# Patient Record
Sex: Female | Born: 1970 | Race: Black or African American | Hispanic: No | Marital: Married | Smoking: Current some day smoker
Health system: Southern US, Community
[De-identification: ages and names within clinical notes are randomized; demographics above are authoritative.]

## PROBLEM LIST (undated history)

## (undated) ENCOUNTER — Emergency Department (HOSPITAL_COMMUNITY): Payer: BC Managed Care – PPO

## (undated) DIAGNOSIS — D649 Anemia, unspecified: Secondary | ICD-10-CM

## (undated) DIAGNOSIS — Z5189 Encounter for other specified aftercare: Secondary | ICD-10-CM

## (undated) DIAGNOSIS — Z9221 Personal history of antineoplastic chemotherapy: Secondary | ICD-10-CM

## (undated) DIAGNOSIS — Z923 Personal history of irradiation: Secondary | ICD-10-CM

## (undated) DIAGNOSIS — D61818 Other pancytopenia: Secondary | ICD-10-CM

## (undated) HISTORY — DX: Anemia, unspecified: D64.9

## (undated) HISTORY — PX: NO PAST SURGERIES: SHX2092

## (undated) HISTORY — DX: Encounter for other specified aftercare: Z51.89

---

## 2001-09-20 ENCOUNTER — Emergency Department (HOSPITAL_COMMUNITY): Admission: EM | Admit: 2001-09-20 | Discharge: 2001-09-20 | Payer: Self-pay | Admitting: Emergency Medicine

## 2001-09-20 ENCOUNTER — Encounter: Payer: Self-pay | Admitting: Emergency Medicine

## 2004-05-28 ENCOUNTER — Emergency Department (HOSPITAL_COMMUNITY): Admission: EM | Admit: 2004-05-28 | Discharge: 2004-05-28 | Payer: Self-pay | Admitting: Emergency Medicine

## 2005-01-14 ENCOUNTER — Emergency Department (HOSPITAL_COMMUNITY): Admission: EM | Admit: 2005-01-14 | Discharge: 2005-01-14 | Payer: Self-pay | Admitting: Emergency Medicine

## 2006-10-29 IMAGING — CT CT HEAD W/O CM
1 of 2 series · 13 of 30 positions shown, 17 images · IV contrast (agent unspecified)
Comparison: None.

CLINICAL DATA: 34-year-old, headache. 
 HEAD CT WITHOUT CONTRAST:
TECHNIQUE: 5mm collimated images were obtained from the base of the skull through the vertex according to standard protocol without contrast.
 There is no evidence of intracranial hemorrhage, brain edema, or mass effect.  No other intra-axial abnormalities are seen, and the ventricles are within normal limits.  No abnormal extra-axial fluid collections or masses are identified.  No skull abnormalities are noted.

[Series 2: brain · axial · 0.47mm/px · z∈[+147,+264]mm · 13 of 28 slices shown, 17 images]
[im 2/28  brain]
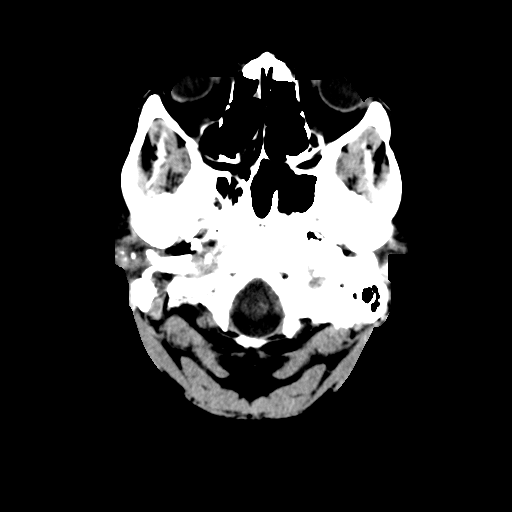
[im 2/28  bone]
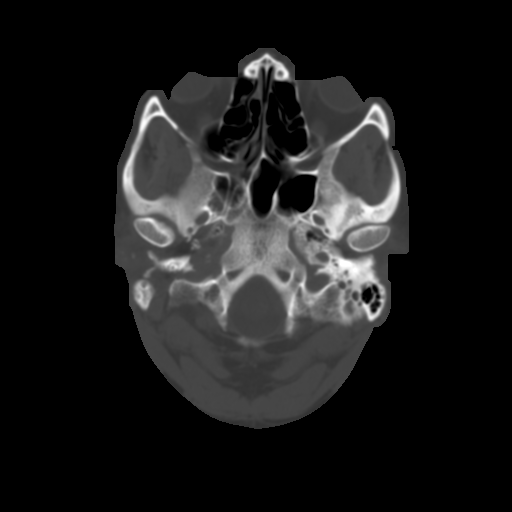
[im 4/28  brain]
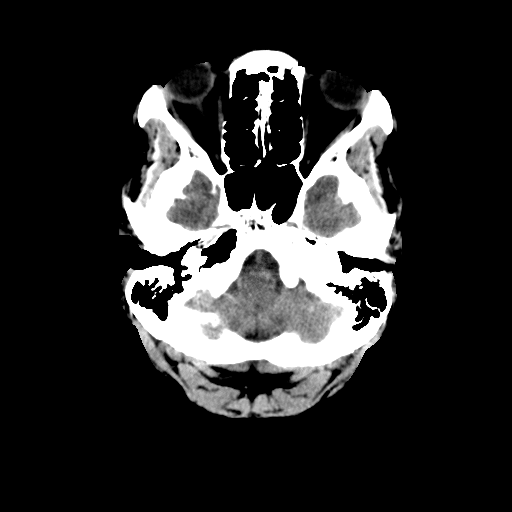
[im 6/28  brain]
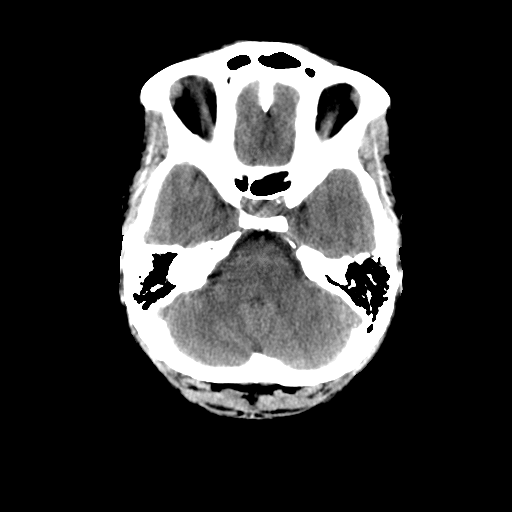
[im 8/28  brain]
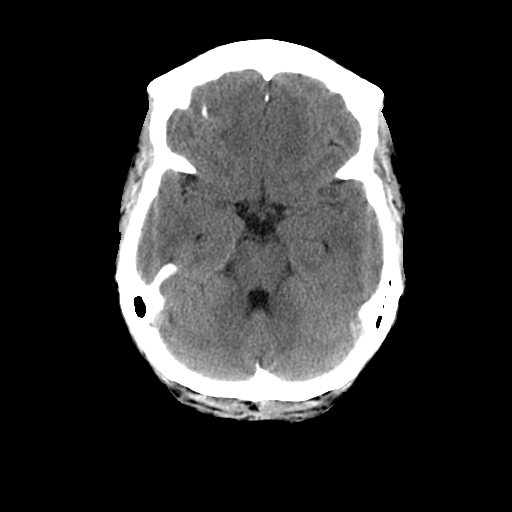
[im 10/28  brain]
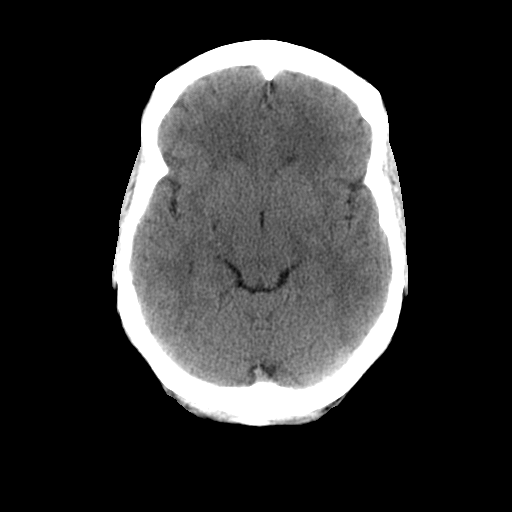
[im 10/28  bone]
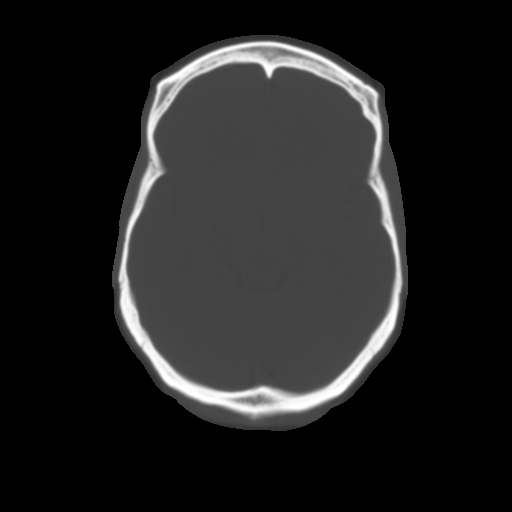
[im 12/28  brain]
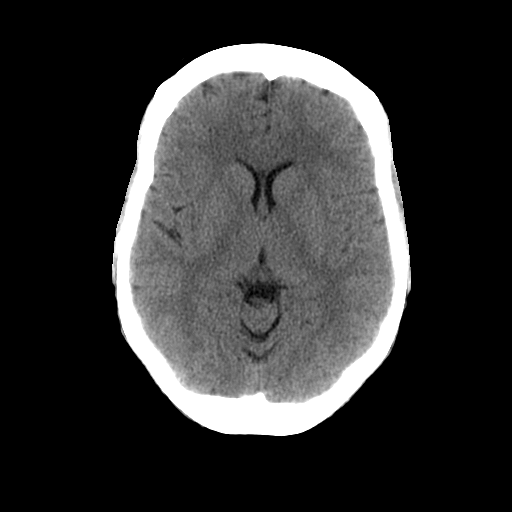
[im 14/28  brain]
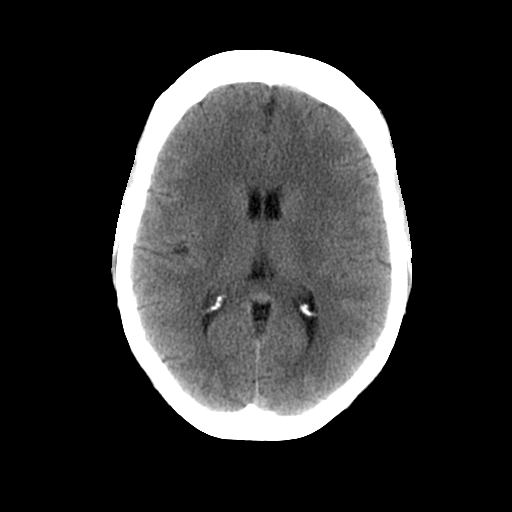
[im 16/28  brain]
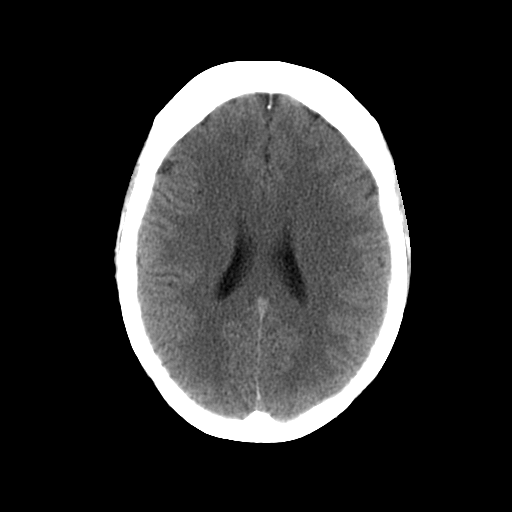
[im 18/28  brain]
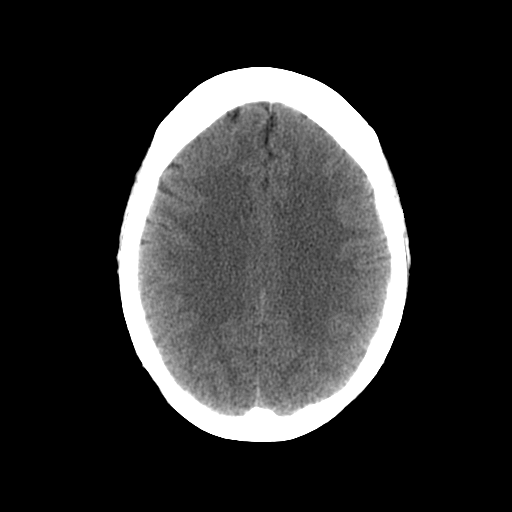
[im 18/28  bone]
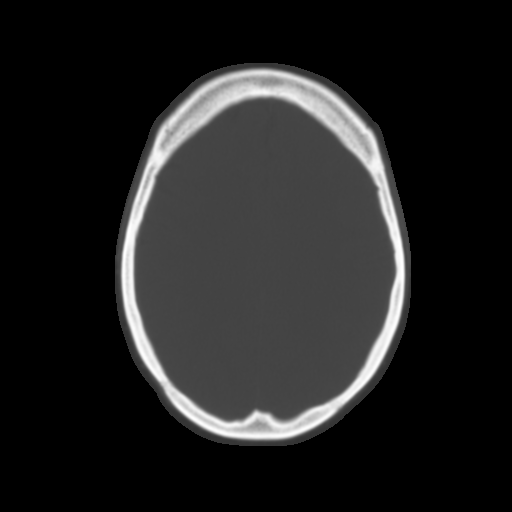
[im 20/28  brain]
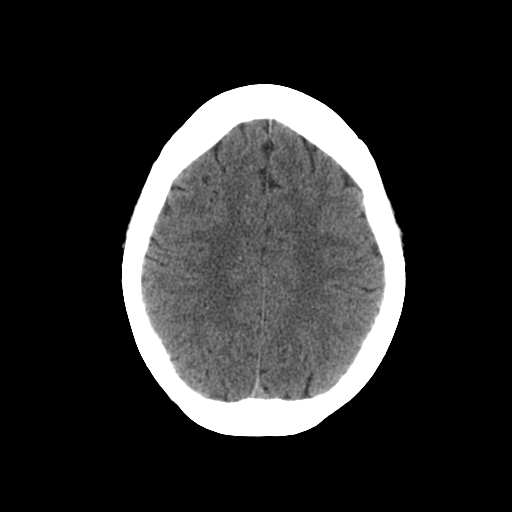
[im 22/28  brain]
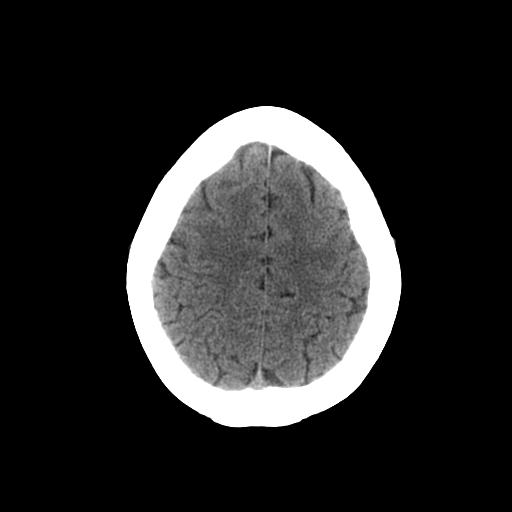
[im 24/28  brain]
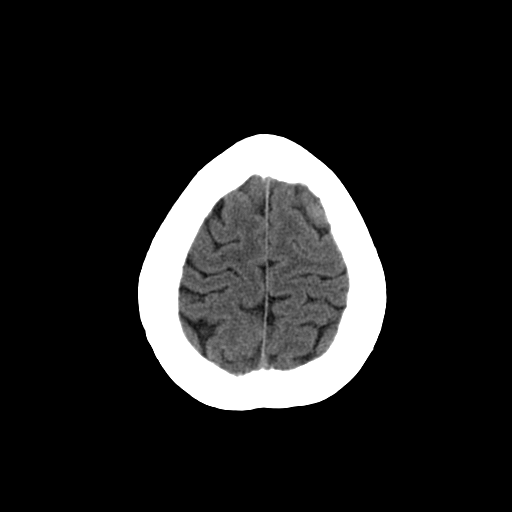
[im 26/28  brain]
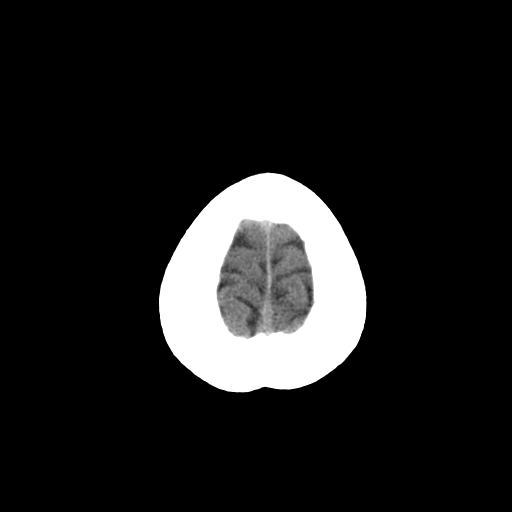
[im 26/28  bone]
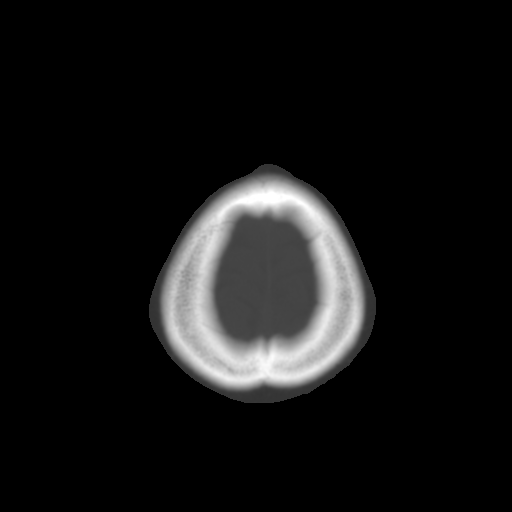

[13 of 30 positions shown; findings below may reference images not displayed]

IMPRESSION: Negative non-contrast head CT.

## 2020-06-09 ENCOUNTER — Other Ambulatory Visit: Payer: Self-pay

## 2020-06-09 ENCOUNTER — Ambulatory Visit (HOSPITAL_COMMUNITY)
Admission: EM | Admit: 2020-06-09 | Discharge: 2020-06-09 | Disposition: A | Discharge status: Home / Self Care | Payer: BC Managed Care – PPO | Attending: Family Medicine | Admitting: Family Medicine

## 2020-06-09 ENCOUNTER — Encounter (HOSPITAL_COMMUNITY): Payer: Self-pay

## 2020-06-09 DIAGNOSIS — J039 Acute tonsillitis, unspecified: Secondary | ICD-10-CM | POA: Insufficient documentation

## 2020-06-09 LAB — POCT RAPID STREP A, ED / UC: Streptococcus, Group A Screen (Direct): NEGATIVE

## 2020-06-09 MED ORDER — CEPHALEXIN 250 MG/5ML PO SUSR: 500.0000 mg | Freq: Two times a day (BID) | ORAL | 0 refills | Status: AC

## 2020-06-09 MED ORDER — PREDNISOLONE SODIUM PHOSPHATE 15 MG/5ML PO SOLN
ORAL | Status: AC
  Filled 2020-06-09: qty 1

## 2020-06-09 MED ORDER — HYDROCODONE-ACETAMINOPHEN 7.5-325 MG/15ML PO SOLN: 15.0000 mL | Freq: Four times a day (QID) | ORAL | 0 refills | Status: AC | PRN

## 2020-06-09 MED ORDER — PREDNISOLONE SODIUM PHOSPHATE 15 MG/5ML PO SOLN
15.0000 mg | Freq: Every day | ORAL | Status: DC
  Administered 2020-06-09: 15 mg via ORAL

## 2020-06-09 MED ORDER — CEFTRIAXONE SODIUM 500 MG IJ SOLR
500.0000 mg | Freq: Once | INTRAMUSCULAR | Status: AC
  Administered 2020-06-09: 500 mg via INTRAMUSCULAR

## 2020-06-09 MED ORDER — LIDOCAINE HCL (PF) 1 % IJ SOLN
INTRAMUSCULAR | Status: AC
  Filled 2020-06-09: qty 2

## 2020-06-09 MED ORDER — CEFTRIAXONE SODIUM 500 MG IJ SOLR
INTRAMUSCULAR | Status: AC
  Filled 2020-06-09: qty 500

## 2020-06-09 NOTE — Discharge Instructions (Signed)
Try to get plenty of fluids Take antibiotic 2 times a day, may start tomorrow Take pain medication as needed Both of these medicines are given to you in liquid form Call or return if not improving in 2 to 3 days

## 2020-06-09 NOTE — ED Triage Notes (Signed)
Pt presents with sore throat the past few days.  

## 2020-06-09 NOTE — ED Provider Notes (Signed)
[facility]    CSN: [id number] Arrival date & time: 06/09/20  1112      History   Chief Complaint Chief Complaint  Patient presents with  . Sore Throat    HPI Lorraine Yates is a 49 y.o. female.   HPI  Patient has a severe sore throat.  Its been going on for the last 3 or 4 days.  Is painful to swallow.  Lorraine Yates also has some congestion and mucus in the back of her throat Lorraine Yates is having trouble managing.  At times Lorraine Yates feels like Lorraine Yates has a cough.  No runny stuffy nose.  No ear pressure pain.  No headache or body ache.  Has not noticed any fever.  He has not been exposed to illness, that Lorraine Yates knows of.  Lorraine Yates has had Covid vaccinations.  History reviewed. No pertinent past medical history.  There are no problems to display for this patient.   History reviewed. No pertinent surgical history.  OB History   No obstetric history on file.      Home Medications    Prior to Admission medications   Medication Sig Start Date End Date Taking? Authorizing Provider  cephALEXin (KEFLEX) 250 MG/5ML suspension Take 10 mLs (500 mg total) by mouth 2 (two) times daily for 10 days. 06/09/20 06/19/20  Eustace Moore, MD  HYDROcodone-acetaminophen (HYCET) 7.5-325 mg/15 ml solution Take 15 mLs by mouth every 6 (six) hours as needed for moderate pain or severe pain. 06/09/20 06/09/21  Eustace Moore, MD    Family History Family History  Family history unknown: Yes    Social History Social History   Tobacco Use  . Smoking status: Not on file  Substance Use Topics  . Alcohol use: Not on file  . Drug use: Not on file     Allergies   Patient has no known allergies.   Review of Systems Review of Systems See HPI  Physical Exam Triage Vital Signs ED Triage Vitals  Enc Vitals Group     BP 06/09/20 1329 (!) 144/89     Pulse Rate 06/09/20 1329 82     Resp 06/09/20 1329 17     Temp 06/09/20 1329 98.5 F (36.9 C)     Temp Source 06/09/20 1329 Oral     SpO2 06/09/20  1329 100 %     Weight --      Height --      Head Circumference --      Peak Flow --      Pain Score 06/09/20 1331 6     Pain Loc --      Pain Edu? --      Excl. in [place]? --    No data found.  Updated Vital Signs BP (!) 144/89 (BP Location: Right Arm)   Pulse 82   Temp 98.5 F (36.9 C) (Oral)   Resp 17   LMP 05/13/2020   SpO2 100%     Physical Exam Constitutional:      General: Lorraine Yates is not in acute distress.    Appearance: Lorraine Yates is well-developed and normal weight. Lorraine Yates is ill-appearing.  HENT:     Head: Normocephalic and atraumatic.     Right Ear: Tympanic membrane and ear canal normal.     Left Ear: Tympanic membrane and ear canal normal.     Nose: No congestion or rhinorrhea.     Mouth/Throat:     Mouth: Mucous membranes are moist.     Pharynx:  Posterior oropharyngeal erythema present.     Tonsils: No tonsillar exudate. 3+ on the right. 3+ on the left.     Comments: Large tonsils.  Copious yellow exudate.  Foul odor. Eyes:     Conjunctiva/sclera: Conjunctivae normal.     Pupils: Pupils are equal, round, and reactive to light.  Cardiovascular:     Rate and Rhythm: Normal rate and regular rhythm.     Heart sounds: Normal heart sounds.  Pulmonary:     Effort: Pulmonary effort is normal. No respiratory distress.     Breath sounds: Normal breath sounds.  Abdominal:     General: There is no distension.     Palpations: Abdomen is soft.  Musculoskeletal:        General: Normal range of motion.     Cervical back: Normal range of motion.  Lymphadenopathy:     Cervical: Cervical adenopathy present.  Skin:    General: Skin is warm and dry.  Neurological:     Mental Status: Lorraine Yates is alert.  Psychiatric:        Mood and Affect: Mood normal.        Behavior: Behavior normal.      UC Treatments / Results  Labs (all labs ordered are listed, but only abnormal results are displayed) Labs Reviewed  CULTURE, GROUP A STREP [facility])  POCT RAPID STREP A, ED / UC     EKG   Radiology No results found.  Procedures Procedures (including critical care time)  Medications Ordered in UC Medications  prednisoLONE (ORAPRED) 15 MG/5ML solution 15 mg (15 mg Oral Given 06/09/20 1409)  cefTRIAXone (ROCEPHIN) injection 500 mg (500 mg Intramuscular Given 06/09/20 1409)    Initial Impression / Assessment and Plan / UC Course  I have reviewed the triage vital signs and the nursing notes.  Pertinent labs & imaging results that were available during my care of the patient were reviewed by me and considered in my medical decision making (see chart for details).     Patient has acute tonsillitis with painful swallowing, diffuse tonsillar exudates, tonsillar swelling.  Will give prednisone in the office to reduce swelling.  We will give a shot of Rocephin to get her started on an antibiotic.  Given Keflex as an antibiotic to take.  Rapid strep is negative.  Strep culture is pending.  This may be viral but given the severity of her tonsillitis I feel covering with antibiotics is prudent. Final Clinical Impressions(s) / UC Diagnoses   Final diagnoses:  Tonsillitis     Discharge Instructions     Try to get plenty of fluids Take antibiotic 2 times a day, may start tomorrow Take pain medication as needed Both of these medicines are given to you in liquid form Call or return if not improving in 2 to 3 days    ED Prescriptions    Medication Sig Dispense Auth. Provider   HYDROcodone-acetaminophen (HYCET) 7.5-325 mg/15 ml solution Take 15 mLs by mouth every 6 (six) hours as needed for moderate pain or severe pain. 120 mL Eustace Moore, MD   cephALEXin [facility]) 250 MG/5ML suspension Take 10 mLs (500 mg total) by mouth 2 (two) times daily for 10 days. 200 mL Eustace Moore, MD     I have reviewed the PDMP during this encounter.   Eustace Moore, MD 06/09/20 [phone]

## 2020-06-10 LAB — CULTURE, GROUP A STREP (THRC)

## 2020-12-16 ENCOUNTER — Other Ambulatory Visit: Payer: Self-pay

## 2020-12-16 ENCOUNTER — Ambulatory Visit (HOSPITAL_COMMUNITY)
Admission: EM | Admit: 2020-12-16 | Discharge: 2020-12-16 | Disposition: A | Discharge status: Home / Self Care | Payer: BC Managed Care – PPO | Attending: Urgent Care | Admitting: Urgent Care

## 2020-12-16 ENCOUNTER — Encounter (HOSPITAL_COMMUNITY): Payer: Self-pay

## 2020-12-16 DIAGNOSIS — R52 Pain, unspecified: Secondary | ICD-10-CM | POA: Insufficient documentation

## 2020-12-16 DIAGNOSIS — F1290 Cannabis use, unspecified, uncomplicated: Secondary | ICD-10-CM | POA: Insufficient documentation

## 2020-12-16 DIAGNOSIS — B349 Viral infection, unspecified: Secondary | ICD-10-CM

## 2020-12-16 DIAGNOSIS — U071 COVID-19: Secondary | ICD-10-CM | POA: Diagnosis not present

## 2020-12-16 DIAGNOSIS — R197 Diarrhea, unspecified: Secondary | ICD-10-CM | POA: Diagnosis not present

## 2020-12-16 DIAGNOSIS — R059 Cough, unspecified: Secondary | ICD-10-CM | POA: Diagnosis present

## 2020-12-16 DIAGNOSIS — F17210 Nicotine dependence, cigarettes, uncomplicated: Secondary | ICD-10-CM | POA: Diagnosis not present

## 2020-12-16 LAB — SARS CORONAVIRUS 2 (TAT 6-24 HRS): SARS Coronavirus 2: POSITIVE — AB

## 2020-12-16 MED ORDER — FLUTICASONE PROPIONATE 50 MCG/ACT NA SUSP: 2.0000 | Freq: Every day | NASAL | 0 refills | Status: DC

## 2020-12-16 MED ORDER — BENZONATATE 100 MG PO CAPS
100.0000 mg | ORAL_CAPSULE | Freq: Three times a day (TID) | ORAL | 0 refills | Status: DC | PRN
Start: 2020-12-16 — End: 2022-08-05

## 2020-12-16 MED ORDER — CETIRIZINE HCL 10 MG PO TABS: 10.0000 mg | ORAL_TABLET | Freq: Every day | ORAL | 0 refills | Status: DC

## 2020-12-16 MED ORDER — PSEUDOEPHEDRINE HCL 60 MG PO TABS: 60.0000 mg | ORAL_TABLET | Freq: Three times a day (TID) | ORAL | 0 refills | Status: DC | PRN

## 2020-12-16 MED ORDER — PROMETHAZINE-DM 6.25-15 MG/5ML PO SYRP: 5.0000 mL | ORAL_SOLUTION | Freq: Every evening | ORAL | 0 refills | Status: DC | PRN

## 2020-12-16 NOTE — Discharge Instructions (Signed)

## 2020-12-16 NOTE — ED Triage Notes (Signed)
Pt c/o fever, diarrhea and pain in stomach when coughing. Pt states she has been sleeping a lot and experiencing chills X 3 days.

## 2020-12-16 NOTE — ED Provider Notes (Signed)
[facility]   MRN: [id number] DOB: 1971-01-04  Subjective:   Lorraine Yates is a 50 y.o. female presenting for 3-day history of acute onset subjective fever, malaise, fatigue, cough that elicits abdominal pain, loose stools.  Patient needs to have COVID-19 testing for her job.  She is a smoker.  Denies history of asthma or COPD.  No history of chronic conditions.  Denies chest pain, shortness of breath, body aches.  She is COVID vaccinated but no booster.  No current facility-administered medications for this encounter.  Current Outpatient Medications:  .  HYDROcodone-acetaminophen (HYCET) 7.5-325 mg/15 ml solution, Take 15 mLs by mouth every 6 (six) hours as needed for moderate pain or severe pain., Disp: 120 mL, Rfl: 0   No Known Allergies  History reviewed. No pertinent past medical history.   History reviewed. No pertinent surgical history.  Family History  Problem Relation Age of Onset  . Alzheimer's disease Mother   . Alzheimer's disease Father     Social History   Tobacco Use  . Smoking status: Current Every Day Smoker    Types: Cigarettes  . Smokeless tobacco: Never Used  Vaping Use  . Vaping Use: Never used  Substance Use Topics  . Drug use: Yes    Types: Marijuana    ROS   Objective:   Vitals: BP 137/85 (BP Location: Right Arm)   Pulse 92   Temp 99 F (37.2 C) (Oral)   Resp 20   LMP 12/02/2020 (Exact Date)   SpO2 98%   Physical Exam Constitutional:      General: She is not in acute distress.    Appearance: Normal appearance. She is well-developed and normal weight. She is not ill-appearing, toxic-appearing or diaphoretic.  HENT:     Head: Normocephalic and atraumatic.     Right Ear: External ear normal.     Left Ear: External ear normal.     Nose: Nose normal.     Mouth/Throat:     Mouth: Mucous membranes are moist.     Pharynx: Oropharynx is clear.  Eyes:     General: No scleral icterus.       Right eye: No discharge.         Left eye: No discharge.     Extraocular Movements: Extraocular movements intact.     Conjunctiva/sclera: Conjunctivae normal.     Pupils: Pupils are equal, round, and reactive to light.  Cardiovascular:     Rate and Rhythm: Normal rate and regular rhythm.     Pulses: Normal pulses.     Heart sounds: Normal heart sounds. No murmur heard. No friction rub. No gallop.   Pulmonary:     Effort: Pulmonary effort is normal. No respiratory distress.     Breath sounds: Normal breath sounds. No stridor. No wheezing, rhonchi or rales.  Abdominal:     General: Bowel sounds are normal. There is no distension.     Palpations: Abdomen is soft. There is no mass.     Tenderness: There is abdominal tenderness (mild upper). There is no right CVA tenderness, left CVA tenderness, guarding or rebound.  Skin:    General: Skin is warm and dry.     Coloration: Skin is not pale.     Findings: No rash.  Neurological:     General: No focal deficit present.     Mental Status: She is alert and oriented to person, place, and time.  Psychiatric:  Mood and Affect: Mood normal.        Behavior: Behavior normal.        Thought Content: Thought content normal.        Judgment: Judgment normal.     Assessment and Plan :   PDMP not reviewed this encounter.  1. Viral illness   2. Cough   3. Body aches   4. Diarrhea, unspecified type     Will manage for viral illness such as viral URI, viral syndrome, viral rhinitis, COVID-19. Counseled patient on nature of COVID-19 including modes of transmission, diagnostic testing, management and supportive care.  Offered scripts for symptomatic relief. COVID 19 testing is pending. Counseled patient on potential for adverse effects with medications prescribed/recommended today, ER and return-to-clinic precautions discussed, patient verbalized understanding.     Wallis Bamberg, PA-C 12/16/20 1032

## 2020-12-18 ENCOUNTER — Telehealth (HOSPITAL_BASED_OUTPATIENT_CLINIC_OR_DEPARTMENT_OTHER): Payer: Self-pay

## 2020-12-18 NOTE — Telephone Encounter (Signed)
-----   Message from Katlynne A Pelkey, RN sent at 12/18/2020  3:19 PM EDT ----- Regarding: UC to PCP Patient needs to establish with PCP - routine   

## 2022-08-05 ENCOUNTER — Encounter (HOSPITAL_COMMUNITY): Payer: Self-pay | Admitting: Emergency Medicine

## 2022-08-05 ENCOUNTER — Ambulatory Visit (HOSPITAL_COMMUNITY)
Admission: EM | Admit: 2022-08-05 | Discharge: 2022-08-05 | Disposition: A | Discharge status: Home / Self Care | Payer: BC Managed Care – PPO | Attending: Family Medicine | Admitting: Family Medicine

## 2022-08-05 ENCOUNTER — Other Ambulatory Visit: Payer: Self-pay

## 2022-08-05 DIAGNOSIS — Z1152 Encounter for screening for COVID-19: Secondary | ICD-10-CM | POA: Insufficient documentation

## 2022-08-05 DIAGNOSIS — R059 Cough, unspecified: Secondary | ICD-10-CM | POA: Diagnosis not present

## 2022-08-05 DIAGNOSIS — J069 Acute upper respiratory infection, unspecified: Secondary | ICD-10-CM | POA: Diagnosis present

## 2022-08-05 DIAGNOSIS — F17200 Nicotine dependence, unspecified, uncomplicated: Secondary | ICD-10-CM | POA: Insufficient documentation

## 2022-08-05 DIAGNOSIS — Z720 Tobacco use: Secondary | ICD-10-CM

## 2022-08-05 LAB — SARS CORONAVIRUS 2 (TAT 6-24 HRS): SARS Coronavirus 2: NEGATIVE

## 2022-08-05 MED ORDER — BENZONATATE 100 MG PO CAPS: 100.0000 mg | ORAL_CAPSULE | Freq: Three times a day (TID) | ORAL | 0 refills | Status: DC | PRN

## 2022-08-05 NOTE — ED Provider Notes (Signed)
[place]    CSN: [id number] Arrival date & time: 08/05/22  3329      History   Chief Complaint Chief Complaint  Patient presents with   Cough    HPI Lorraine Yates is a 52 y.o. female.   Patient presents urgent care for evaluation of cough, nasal congestion, episodic diarrhea without blood/mucus to the stools, and generalized bodyaches that started 2 days ago.  She states she also had a fever as high as 102 at home but this has since improved significantly and has responded well to use of Tylenol and ibuprofen at home.  Cough is dry, nonproductive, and worse at nighttime.  She denies headache, dizziness, vision changes, abdominal pain, nausea, vomiting, intake of foods outside of her normal diet, and urinary symptoms.  Last dose of antipyretic was last night and she is currently afebrile.  She is a current every day cigarette and marijuana smoker, denies all other drug use.  No history of chronic respiratory problems and asthma.  No known sick exposures.  Has been using over-the-counter medications to help with symptoms with some relief and states that this is the best she has felt in the last 2 days.  No shortness of breath, chest pain, heart palpitations, or weakness.  She has not heard any wheezing to her chest.   Cough   History reviewed. No pertinent past medical history.  There are no problems to display for this patient.   History reviewed. No pertinent surgical history.  OB History   No obstetric history on file.      Home Medications    Prior to Admission medications   Medication Sig Start Date End Date Taking? Authorizing Provider  benzonatate (TESSALON) 100 MG capsule Take 1 capsule (100 mg total) by mouth every 8 (eight) hours as needed for cough. 08/05/22   Talbot Grumbling, FNP  cetirizine (ZYRTEC ALLERGY) 10 MG tablet Take 1 tablet (10 mg total) by mouth daily. Patient not taking: Reported on 08/05/2022 12/16/20   Jaynee Eagles, PA-C  fluticasone  [facility]) 50 MCG/ACT nasal spray Place 2 sprays into both nostrils daily. Patient not taking: Reported on 08/05/2022 12/16/20   Jaynee Eagles, PA-C  promethazine-dextromethorphan (PROMETHAZINE-DM) 6.25-15 MG/5ML syrup Take 5 mLs by mouth at bedtime as needed for cough. Patient not taking: Reported on 08/05/2022 12/16/20   Jaynee Eagles, PA-C  pseudoephedrine (SUDAFED) 60 MG tablet Take 1 tablet (60 mg total) by mouth every 8 (eight) hours as needed for congestion. Patient not taking: Reported on 08/05/2022 12/16/20   Jaynee Eagles, PA-C    Family History Family History  Problem Relation Age of Onset   Alzheimer's disease Mother    Alzheimer's disease Father     Social History Social History   Tobacco Use   Smoking status: Every Day    Types: Cigarettes   Smokeless tobacco: Never  Vaping Use   Vaping Use: Never used  Substance Use Topics   Alcohol use: Yes    Comment: socially   Drug use: Yes    Types: Marijuana     Allergies   Patient has no known allergies.   Review of Systems Review of Systems  Respiratory:  Positive for cough.   Per HPI   Physical Exam Triage Vital Signs ED Triage Vitals  Enc Vitals Group     BP 08/05/22 0916 99/65     Pulse Rate 08/05/22 0916 87     Resp 08/05/22 0916 20     Temp 08/05/22  0916 98.7 F (37.1 C)     Temp Source 08/05/22 0916 Oral     SpO2 08/05/22 0916 98 %     Weight --      Height --      Head Circumference --      Peak Flow --      Pain Score 08/05/22 0914 0     Pain Loc --      Pain Edu? --      Excl. in [place]? --    No data found.  Updated Vital Signs BP 99/65 (BP Location: Left Arm)   Pulse 87   Temp 98.7 F (37.1 C) (Oral)   Resp 20   SpO2 98%   Visual Acuity Right Eye Distance:   Left Eye Distance:   Bilateral Distance:    Right Eye Near:   Left Eye Near:    Bilateral Near:     Physical Exam Vitals and nursing note reviewed.  Constitutional:      Appearance: She is ill-appearing. She is not toxic-appearing.   HENT:     Head: Normocephalic and atraumatic.     Right Ear: Hearing, tympanic membrane, ear canal and external ear normal.     Left Ear: Hearing, tympanic membrane, ear canal and external ear normal.     Nose: Congestion present.     Mouth/Throat:     Lips: Pink.     Mouth: Mucous membranes are moist.     Pharynx: No posterior oropharyngeal erythema.  Eyes:     General: Lids are normal. Vision grossly intact. Gaze aligned appropriately.     Extraocular Movements: Extraocular movements intact.     Conjunctiva/sclera: Conjunctivae normal.  Cardiovascular:     Rate and Rhythm: Normal rate and regular rhythm.     Heart sounds: Normal heart sounds, S1 normal and S2 normal.  Pulmonary:     Effort: Pulmonary effort is normal. No respiratory distress.     Breath sounds: Normal breath sounds and air entry.  Abdominal:     General: Bowel sounds are normal.     Palpations: Abdomen is soft.     Tenderness: There is no abdominal tenderness. There is no right CVA tenderness, left CVA tenderness or guarding.  Musculoskeletal:     Cervical back: Neck supple.  Lymphadenopathy:     Cervical: Cervical adenopathy present.  Skin:    General: Skin is warm and dry.     Capillary Refill: Capillary refill takes less than 2 seconds.     Findings: No rash.  Neurological:     General: No focal deficit present.     Mental Status: She is alert and oriented to person, place, and time. Mental status is at baseline.     Cranial Nerves: No dysarthria or facial asymmetry.  Psychiatric:        Mood and Affect: Mood normal.        Speech: Speech normal.        Behavior: Behavior normal.        Thought Content: Thought content normal.        Judgment: Judgment normal.      UC Treatments / Results  Labs (all labs ordered are listed, but only abnormal results are displayed) Labs Reviewed  SARS CORONAVIRUS 2 (TAT 6-24 HRS)    EKG   Radiology No results found.  Procedures Procedures (including  critical care time)  Medications Ordered in UC Medications - No data to display  Initial Impression / Assessment and Plan /  UC Course  I have reviewed the triage vital signs and the nursing notes.  Pertinent labs & imaging results that were available during my care of the patient were reviewed by me and considered in my medical decision making (see chart for details).   1. Viral URI with cough Symptoms and physical exam consistent with a viral upper respiratory tract infection that will likely resolve with rest, fluids, and prescriptions for symptomatic relief. Deferred imaging based on stable cardiopulmonary exam and hemodynamically stable vital signs.  COVID-19 testing is pending.  We will call patient if this is positive.  Quarantine guidelines discussed. Currently on day 3 of symptoms and does qualify for antiviral therapy. She may have molnupiravir if positive for COVID-19 as I cannot find a GFR on file or via care everywhere.   Tessalon Perles sent to pharmacy for symptomatic relief to be taken as prescribed.  May continue taking over the counter medications as directed for further symptomatic relief. Nonpharmacologic interventions for symptom relief provided and after visit summary below. Advised to push fluids to stay well hydrated while recovering from viral illness.   Discussed physical exam and available lab work findings in clinic with patient.  Counseled patient regarding appropriate use of medications and potential side effects for all medications recommended or prescribed today. Discussed red flag signs and symptoms of worsening condition,when to call the PCP office, return to urgent care, and when to seek higher level of care in the emergency department. Patient verbalizes understanding and agreement with plan. All questions answered. Patient discharged in stable condition.   Final Clinical Impressions(s) / UC Diagnoses   Final diagnoses:  Viral URI with cough  Tobacco abuse      Discharge Instructions      You have a viral upper respiratory infection.  COVID-19 testing is pending. We will call you with results if positive. If your COVID test is positive, you must stay at home until day 6 of symptoms. On day 6, you may go out into public and go back to work, but you must wear a mask until day 11 of symptoms to prevent spread to others.  Use the following medicines to help with symptoms: - Plain Mucinex (guaifenesin) over the counter as directed every 12 hours to thin mucous so that you are able to get it out of your body easier. Drink plenty of water while taking this medication so that it works well in your body (at least 8 cups a day).  - Tylenol 1,000mg  and/or ibuprofen 600mg  every 6 hours with food as needed for aches/pains or fever/chills.  - Tessalon perles every 8 hours as needed for cough.  1 tablespoon of honey in warm water and/or salt water gargles may also help with symptoms. Humidifier to your room will help add water to the air and reduce coughing.  If you develop any new or worsening symptoms, please return.  If your symptoms are severe, please go to the emergency room.  Follow-up with your primary care provider for further evaluation and management of your symptoms as well as ongoing wellness visits.  I hope you feel better!    ED Prescriptions     Medication Sig Dispense Auth. Provider   benzonatate (TESSALON) 100 MG capsule Take 1 capsule (100 mg total) by mouth every 8 (eight) hours as needed for cough. 60 capsule , FNP      PDMP not reviewed this encounter.   Carlisle Beers, Carlisle Beers 08/05/22 1019

## 2022-08-05 NOTE — Discharge Instructions (Signed)
You have a viral upper respiratory infection.  COVID-19 testing is pending. We will call you with results if positive. If your COVID test is positive, you must stay at home until day 6 of symptoms. On day 6, you may go out into public and go back to work, but you must wear a mask until day 11 of symptoms to prevent spread to others.  Use the following medicines to help with symptoms: - Plain Mucinex (guaifenesin) over the counter as directed every 12 hours to thin mucous so that you are able to get it out of your body easier. Drink plenty of water while taking this medication so that it works well in your body (at least 8 cups a day).  - Tylenol 1,000mg and/or ibuprofen 600mg every 6 hours with food as needed for aches/pains or fever/chills.  - Tessalon perles every 8 hours as needed for cough.  1 tablespoon of honey in warm water and/or salt water gargles may also help with symptoms. Humidifier to your room will help add water to the air and reduce coughing.  If you develop any new or worsening symptoms, please return.  If your symptoms are severe, please go to the emergency room.  Follow-up with your primary care provider for further evaluation and management of your symptoms as well as ongoing wellness visits.  I hope you feel better!  

## 2022-08-05 NOTE — ED Triage Notes (Signed)
Complains of fever, coughing, runny nose, body aches, and diarrhea.  No diarrhea today.  Reports fever 102 two days ago.  Symptoms overall started Monday night.    Patient is taking nyquil, ibuprofen, vicks vapor rub

## 2022-11-10 DIAGNOSIS — C531 Malignant neoplasm of exocervix: Secondary | ICD-10-CM

## 2022-11-10 HISTORY — DX: Malignant neoplasm of exocervix: C53.1

## 2022-11-24 ENCOUNTER — Emergency Department (HOSPITAL_COMMUNITY): Payer: BC Managed Care – PPO

## 2022-11-24 ENCOUNTER — Other Ambulatory Visit: Payer: Self-pay

## 2022-11-24 ENCOUNTER — Emergency Department (HOSPITAL_COMMUNITY)
Admission: EM | Admit: 2022-11-24 | Discharge: 2022-11-24 | Disposition: A | Discharge status: Home / Self Care | Payer: BC Managed Care – PPO | Attending: Student | Admitting: Student

## 2022-11-24 ENCOUNTER — Encounter (HOSPITAL_COMMUNITY): Payer: Self-pay | Admitting: Emergency Medicine

## 2022-11-24 DIAGNOSIS — N888 Other specified noninflammatory disorders of cervix uteri: Secondary | ICD-10-CM

## 2022-11-24 DIAGNOSIS — K625 Hemorrhage of anus and rectum: Secondary | ICD-10-CM | POA: Diagnosis not present

## 2022-11-24 DIAGNOSIS — N889 Noninflammatory disorder of cervix uteri, unspecified: Secondary | ICD-10-CM | POA: Insufficient documentation

## 2022-11-24 DIAGNOSIS — N939 Abnormal uterine and vaginal bleeding, unspecified: Secondary | ICD-10-CM | POA: Diagnosis present

## 2022-11-24 DIAGNOSIS — D649 Anemia, unspecified: Secondary | ICD-10-CM | POA: Diagnosis not present

## 2022-11-24 LAB — CBC WITH DIFFERENTIAL/PLATELET
Abs Immature Granulocytes: 0.01 10*3/uL (ref 0.00–0.07)
Basophils Absolute: 0.1 10*3/uL (ref 0.0–0.1)
Basophils Relative: 1 %
Eosinophils Absolute: 0.3 10*3/uL (ref 0.0–0.5)
Eosinophils Relative: 5 %
HCT: 31.2 % — ABNORMAL LOW (ref 36.0–46.0)
Hemoglobin: 8.8 g/dL — ABNORMAL LOW (ref 12.0–15.0)
Immature Granulocytes: 0 %
Lymphocytes Relative: 27 %
Lymphs Abs: 1.5 10*3/uL (ref 0.7–4.0)
MCH: 23 pg — ABNORMAL LOW (ref 26.0–34.0)
MCHC: 28.2 g/dL — ABNORMAL LOW (ref 30.0–36.0)
MCV: 81.7 fL (ref 80.0–100.0)
Monocytes Absolute: 0.6 10*3/uL (ref 0.1–1.0)
Monocytes Relative: 10 %
Neutro Abs: 3.2 10*3/uL (ref 1.7–7.7)
Neutrophils Relative %: 57 %
Platelets: 511 10*3/uL — ABNORMAL HIGH (ref 150–400)
RBC: 3.82 MIL/uL — ABNORMAL LOW (ref 3.87–5.11)
WBC: 5.5 10*3/uL (ref 4.0–10.5)
nRBC: 0 % (ref 0.0–0.2)

## 2022-11-24 LAB — COMPREHENSIVE METABOLIC PANEL
ALT: 11 U/L (ref 0–44)
AST: 18 U/L (ref 15–41)
Albumin: 3.6 g/dL (ref 3.5–5.0)
Alkaline Phosphatase: 69 U/L (ref 38–126)
Anion gap: 11 (ref 5–15)
BUN: 11 mg/dL (ref 6–20)
CO2: 23 mmol/L (ref 22–32)
Calcium: 9.4 mg/dL (ref 8.9–10.3)
Chloride: 103 mmol/L (ref 98–111)
Creatinine, Ser: 0.7 mg/dL (ref 0.44–1.00)
GFR, Estimated: >60 mL/min (ref >60)
Glucose, Bld: 106 mg/dL — ABNORMAL HIGH (ref 70–99)
Potassium: 3.6 mmol/L (ref 3.5–5.1)
Sodium: 137 mmol/L (ref 135–145)
Total Bilirubin: 0.1 mg/dL — ABNORMAL LOW (ref 0.3–1.2)
Total Protein: 7.1 g/dL (ref 6.5–8.1)

## 2022-11-24 LAB — IRON AND TIBC
Iron: 46 ug/dL (ref 28–170)
Saturation Ratios: 12 % (ref 10.4–31.8)
TIBC: 400 ug/dL (ref 250–450)
UIBC: 354 ug/dL

## 2022-11-24 LAB — URINALYSIS, ROUTINE W REFLEX MICROSCOPIC
Bacteria, UA: NONE SEEN
Bilirubin Urine: NEGATIVE
Glucose, UA: NEGATIVE mg/dL
Ketones, ur: 5 mg/dL — AB
Nitrite: NEGATIVE
Protein, ur: 30 mg/dL — AB
RBC / HPF: >50 RBC/hpf (ref 0–5)
Specific Gravity, Urine: 1.018 (ref 1.005–1.030)
pH: 5 (ref 5.0–8.0)

## 2022-11-24 LAB — TYPE AND SCREEN
ABO/RH(D): A NEG
Antibody Screen: NEGATIVE

## 2022-11-24 LAB — FERRITIN: Ferritin: 26 ng/mL (ref 11–307)

## 2022-11-24 LAB — I-STAT BETA HCG BLOOD, ED (MC, WL, AP ONLY): I-stat hCG, quantitative: <5 m[IU]/mL (ref <5)

## 2022-11-24 LAB — ABO/RH: ABO/RH(D): A NEG

## 2022-11-24 MED ORDER — IOHEXOL 350 MG/ML SOLN
75.0000 mL | Freq: Once | INTRAVENOUS | Status: AC | PRN
  Administered 2022-11-24: 75 mL via INTRAVENOUS

## 2022-11-24 MED ORDER — IOHEXOL 9 MG/ML PO SOLN
500.0000 mL | ORAL | Status: AC
  Administered 2022-11-24 (×2): 500 mL via ORAL

## 2022-11-24 NOTE — Consult Note (Addendum)
Reason for Consult:vaginal bleeding with cervical mass Referring Physician: Ernie Avena, MD   Lorraine Yates is an 52 y.o. female. G3P3, who presented with vaginal bleeding for a week that became much heavier with clots, no pain. Her last gyn visit was 22 years ago after her pregnancy. She had no bleeding for almost a year before this episode and she had thought she may be menopausal.   Pertinent Gynecological History:  Bleeding: heavy with clots  DES exposure: unknown Blood transfusions: none Sexually transmitted diseases: no past history Last pap:  22 years   OB History: G3, P3   Menstrual History:  No LMP recorded. (Menstrual status: Irregular Periods).    History reviewed. No pertinent past medical history.  History reviewed. No pertinent surgical history.  Family History  Problem Relation Age of Onset   Alzheimer's disease Mother    Alzheimer's disease Father     Social History:  reports that she has been smoking cigarettes. She has never used smokeless tobacco. She reports current alcohol use. She reports current drug use. Drug: Marijuana.  Allergies: No Known Allergies  Medications: I have reviewed the patient's current medications.  Review of Systems  Constitutional: Negative.   Respiratory: Negative.    Cardiovascular: Negative.   Gastrointestinal: Negative.   Genitourinary:  Positive for vaginal bleeding. Negative for pelvic pain.    Blood pressure (!) 140/83, pulse 74, temperature 98.7 F (37.1 C), resp. rate 18, height 5\' 4"  (1.626 m), weight 64.9 kg, SpO2 100 %. Physical Exam Vitals and nursing note reviewed. Exam conducted with a chaperone present.  Constitutional:      Appearance: Normal appearance. She is not ill-appearing.  Cardiovascular:     Rate and Rhythm: Normal rate.  Pulmonary:     Effort: Pulmonary effort is normal.  Abdominal:     General: Abdomen is flat.     Palpations: Abdomen is soft.  Genitourinary:    General: Normal vulva.      Exam position: Lithotomy position.     Vagina: Normal.     Uterus: Normal.      Adnexa: Right adnexa normal and left adnexa normal.     Comments: 6 cm irregular cervical mass not c/w prolapsed fibroid, not tender Neurological:     Mental Status: She is alert.  Psychiatric:        Mood and Affect: Mood normal.        Behavior: Behavior normal.     Results for orders placed or performed during the hospital encounter of 11/24/22 (from the past 48 hour(s))  Comprehensive metabolic panel     Status: Abnormal   Collection Time: 11/24/22  5:00 AM  Result Value Ref Range   Sodium 137 135 - 145 mmol/L   Potassium 3.6 3.5 - 5.1 mmol/L   Chloride 103 98 - 111 mmol/L   CO2 23 22 - 32 mmol/L   Glucose, Bld 106 (H) 70 - 99 mg/dL    Comment: Glucose reference range applies only to samples taken after fasting for at least 8 hours.   BUN 11 6 - 20 mg/dL   Creatinine, Ser 1.61 0.44 - 1.00 mg/dL   Calcium 9.4 8.9 - 09.6 mg/dL   Total Protein 7.1 6.5 - 8.1 g/dL   Albumin 3.6 3.5 - 5.0 g/dL   AST 18 15 - 41 U/L   ALT 11 0 - 44 U/L   Alkaline Phosphatase 69 38 - 126 U/L   Total Bilirubin 0.1 (L) 0.3 - 1.2 mg/dL  GFR, Estimated >60 >60 mL/min    Comment: (NOTE) Calculated using the CKD-EPI Creatinine Equation (2021)    Anion gap 11 5 - 15    Comment: Performed at [facility], [address], [place], [place] [address]  CBC with Differential/Platelet     Status: Abnormal   Collection Time: 11/24/22  5:00 AM  Result Value Ref Range   WBC 5.5 4.0 - 10.5 K/uL   RBC 3.82 (L) 3.87 - 5.11 MIL/uL   Hemoglobin 8.8 (L) 12.0 - 15.0 g/dL   HCT 19.1 (L) 47.8 - 29.5 %   MCV 81.7 80.0 - 100.0 fL   MCH 23.0 (L) 26.0 - 34.0 pg   MCHC 28.2 (L) 30.0 - 36.0 g/dL   RDW Not Measured 62.1 - 15.5 %   Platelets 511 (H) 150 - 400 K/uL    Comment: REPEATED TO VERIFY   nRBC 0.0 0.0 - 0.2 %   Neutrophils Relative % 57 %   Neutro Abs 3.2 1.7 - 7.7 K/uL   Lymphocytes Relative 27 %   Lymphs Abs 1.5 0.7 -  4.0 K/uL   Monocytes Relative 10 %   Monocytes Absolute 0.6 0.1 - 1.0 K/uL   Eosinophils Relative 5 %   Eosinophils Absolute 0.3 0.0 - 0.5 K/uL   Basophils Relative 1 %   Basophils Absolute 0.1 0.0 - 0.1 K/uL   Immature Granulocytes 0 %   Abs Immature Granulocytes 0.01 0.00 - 0.07 K/uL    Comment: Performed at [facility], [address], [place], [place] [address]  Type and screen [facility]     Status: None   Collection Time: 11/24/22  5:01 AM  Result Value Ref Range   ABO/RH(D) A NEG    Antibody Screen NEG    Sample Expiration      11/27/2022,2359 Performed at [facility], [address] [place], [place] [address]   I-Stat beta hCG blood, ED ([facility], [facility], [facility] only)     Status: None   Collection Time: 11/24/22  5:11 AM  Result Value Ref Range   I-stat hCG, quantitative <5.0 <5 mIU/mL   Comment 3            Comment:   GEST. AGE      CONC.  (mIU/mL)   <=1 WEEK        5 - 50     2 WEEKS       50 - 500     3 WEEKS       100 - 10,000     4 WEEKS     1,000 - 30,000        FEMALE AND NON-PREGNANT FEMALE:     LESS THAN 5 mIU/mL   ABO/Rh     Status: None   Collection Time: 11/24/22  5:11 AM  Result Value Ref Range   ABO/RH(D)      A NEG Performed at [facility], [address], [place], [place] [address]   Iron and TIBC     Status: None   Collection Time: 11/24/22  8:32 AM  Result Value Ref Range   Iron 46 28 - 170 ug/dL   TIBC 841 324 - 401 ug/dL   Saturation Ratios 12 10.4 - 31.8 %   UIBC 354 ug/dL    Comment: Performed at [facility], [address], [place], [place] [address]  Ferritin (Iron Binding Protein)     Status:  None   Collection Time: 11/24/22  8:32 AM  Result Value Ref Range   Ferritin 26 11 - 307 ng/mL    Comment: Performed at [facility], [address] [place], [place] [address]  Urinalysis, Routine w reflex microscopic -Urine, Clean Catch     Status: Abnormal   Collection Time: 11/24/22  9:04 AM  Result  Value Ref Range   Color, Urine YELLOW YELLOW   APPearance HAZY (A) CLEAR   Specific Gravity, Urine 1.018 1.005 - 1.030   pH 5.0 5.0 - 8.0   Glucose, UA NEGATIVE NEGATIVE mg/dL   Hgb urine dipstick LARGE (A) NEGATIVE   Bilirubin Urine NEGATIVE NEGATIVE   Ketones, ur 5 (A) NEGATIVE mg/dL   Protein, ur 30 (A) NEGATIVE mg/dL   Nitrite NEGATIVE NEGATIVE   Leukocytes,Ua TRACE (A) NEGATIVE   RBC / HPF >50 0 - 5 RBC/hpf   WBC, UA 0-5 0 - 5 WBC/hpf   Bacteria, UA NONE SEEN NONE SEEN   Squamous Epithelial / HPF 0-5 0 - 5 /HPF   Mucus PRESENT     Comment: Performed at [facility], [address], [place], [place] [address]    US Pelvis Complete  Result Date: 11/24/2022 CLINICAL DATA:  52 year old female with postmenopausal bleeding. EXAM: TRANSABDOMINAL ULTRASOUND OF PELVIS TECHNIQUE: Transabdominal ultrasound examination of the pelvis was performed including evaluation of the uterus, ovaries, adnexal regions, and pelvic cul-de-sac. COMPARISON:  None Available. FINDINGS: Uterus Measurements: 9.9 x 3.9 x 5.1 cm = volume: 104 mL. Uterine body and fundus myometrium seems within normal limits. But there is a bulky and lobulated lower uterine segment mass (series 1, image 22) which is approximately 6.4 x 5.4 x 7.6 cm, is solid and has internal vascular elements (series 1, image 69). This seems inseparable from the cervix. Endometrium Thickness: 6 mm. No endometrial abnormality above the lower uterine segment mass. Right ovary Measurements: 1.4 x 1.0 x 1.0 cm = volume: 1 mL. Normal appearance/no adnexal mass. Left ovary Measurements: 1.9 x 1.1 x 1.3 cm = volume: 1 mL. Normal appearance/no adnexal mass. Other findings:  No pelvis free fluid. IMPRESSION: 1. Large, approximately 7.6 cm vascular soft tissue mass of the lower uterine segment. This seems inseparable from the cervix and Cervical Carcinoma is not excluded. Alternatively this might be a large lower uterine fibroid. Recommend direct visualization  of the cervix. 2. Elsewhere the uterus and endometrium are within normal limits. Normal ovaries and no free fluid. Electronically Signed   By: Lorraine Yates M.D.   On: 11/24/2022 07:07    Assessment/Plan: Cervical mass c/w suspected cervical carcinoma. I spoke to Dr. Pricilla Holm who will see her on Friday at [facility] and she will be contacted. CT pelvis will be done prior to discharge from ED as part of her workup 50 min face to face review of information and coordination of care Scheryl Darter 11/24/2022

## 2022-11-24 NOTE — ED Provider Notes (Signed)
[place] EMERGENCY DEPARTMENT AT [facility] Provider Note   CSN: [id number] Arrival date & time: 11/24/22  0446     History  Chief Complaint  Patient presents with   Vaginal Bleeding   Rectal Bleeding    Lorraine Yates is a 52 y.o. female.   Vaginal Bleeding Rectal Bleeding    52 year old female postmenopausal who presents to the emergency department with intermittent vaginal bleeding that has been heavy over the past month.  She states that it is worse upon standing.  She noticed significant passage of blood clots today which prompted her presentation to the emergency room.  She thought she might be having rectal bleeding.  She denies any fatigue, lightheadedness, shortness of breath.  She endorses crampy pelvic discomfort.  Home Medications Prior to Admission medications   Medication Sig Start Date End Date Taking? Authorizing Provider  benzonatate (TESSALON) 100 MG capsule Take 1 capsule (100 mg total) by mouth every 8 (eight) hours as needed for cough. 08/05/22   Carlisle Beers, FNP  cetirizine (ZYRTEC ALLERGY) 10 MG tablet Take 1 tablet (10 mg total) by mouth daily. Patient not taking: Reported on 08/05/2022 12/16/20   Wallis Bamberg, PA-C  fluticasone [facility]) 50 MCG/ACT nasal spray Place 2 sprays into both nostrils daily. Patient not taking: Reported on 08/05/2022 12/16/20   Wallis Bamberg, PA-C  promethazine-dextromethorphan (PROMETHAZINE-DM) 6.25-15 MG/5ML syrup Take 5 mLs by mouth at bedtime as needed for cough. Patient not taking: Reported on 08/05/2022 12/16/20   Wallis Bamberg, PA-C  pseudoephedrine (SUDAFED) 60 MG tablet Take 1 tablet (60 mg total) by mouth every 8 (eight) hours as needed for congestion. Patient not taking: Reported on 08/05/2022 12/16/20   Wallis Bamberg, PA-C      Allergies    Patient has no known allergies.    Review of Systems   Review of Systems  Gastrointestinal:  Positive for hematochezia.  Genitourinary:  Positive for pelvic pain and  vaginal bleeding.  All other systems reviewed and are negative.   Physical Exam Updated Vital Signs BP 131/80   Pulse 84   Temp 98.6 F (37 C) (Oral)   Resp 19   Ht 5\' 4"  (1.626 m)   Wt 64.9 kg   SpO2 100%   BMI 24.55 kg/m  Physical Exam Vitals and nursing note reviewed. Exam conducted with a chaperone present.  Constitutional:      General: She is not in acute distress. HENT:     Head: Normocephalic and atraumatic.  Eyes:     Conjunctiva/sclera: Conjunctivae normal.     Pupils: Pupils are equal, round, and reactive to light.  Cardiovascular:     Rate and Rhythm: Normal rate and regular rhythm.  Pulmonary:     Effort: Pulmonary effort is normal. No respiratory distress.  Abdominal:     General: There is no distension.     Tenderness: There is no abdominal tenderness. There is no guarding.  Genitourinary:    Comments: Significantly dysplastic cervical tissue present, active bleeding noted with passage of golf ball sized clots Musculoskeletal:        General: No deformity or signs of injury.     Cervical back: Neck supple.  Skin:    Findings: No lesion or rash.  Neurological:     General: No focal deficit present.     Mental Status: She is alert. Mental status is at baseline.     ED Results / Procedures / Treatments   Labs (all labs ordered are  listed, but only abnormal results are displayed) Labs Reviewed  COMPREHENSIVE METABOLIC PANEL - Abnormal; Notable for the following components:      Result Value   Glucose, Bld 106 (*)    Total Bilirubin 0.1 (*)    All other components within normal limits  URINALYSIS, ROUTINE W REFLEX MICROSCOPIC - Abnormal; Notable for the following components:   APPearance HAZY (*)    Hgb urine dipstick LARGE (*)    Ketones, ur 5 (*)    Protein, ur 30 (*)    Leukocytes,Ua TRACE (*)    All other components within normal limits  CBC WITH DIFFERENTIAL/PLATELET - Abnormal; Notable for the following components:   RBC 3.82 (*)     Hemoglobin 8.8 (*)    HCT 31.2 (*)    MCH 23.0 (*)    MCHC 28.2 (*)    Platelets 511 (*)    All other components within normal limits  IRON AND TIBC  FERRITIN  CBC WITH DIFFERENTIAL/PLATELET  I-STAT BETA HCG BLOOD, ED ([facility], [facility], [facility] ONLY)  TYPE AND SCREEN  ABO/RH    EKG None  Radiology CT PELVIS W CONTRAST  Result Date: 11/24/2022 CLINICAL DATA:  Cervical cancer, staging. Heavy vaginal bleeding. Rectal bleeding. EXAM: CT PELVIS WITH CONTRAST TECHNIQUE: Multidetector CT imaging of the pelvis was performed using the standard protocol following the bolus administration of intravenous contrast. RADIATION DOSE REDUCTION: This exam was performed according to the departmental dose-optimization program which includes automated exposure control, adjustment of the mA and/or kV according to patient size and/or use of iterative reconstruction technique. CONTRAST:  75mL OMNIPAQUE IOHEXOL 350 MG/ML SOLN COMPARISON:  Pelvic ultrasound 11/24/2022. FINDINGS: Urinary Tract:  None. Bowel: Visualized bowel is grossly unremarkable. The below described cervical mass does contact the rectum. Vascular/Lymphatic: Vascular structures are unremarkable. Scattered iliac chain lymph nodes are not enlarged by CT size criteria. Reproductive: Heterogeneous cervical mass measures 6.3 x 7.8 cm and contains at least 1 locule of air. Uterus is visualized. No adnexal mass. Other:  No free fluid. Musculoskeletal: None. IMPRESSION: 1. Heterogeneous cervical mass is highly worrisome for cervical carcinoma. Significant border of contact with the adjacent rectum. Difficult to exclude invasion. 2. Locule of air within the cervical mass may be due to necrosis or extension from the vagina. Difficult to exclude a fistula to the rectum. Electronically Signed   By: Leanna Battles M.D.   On: 11/24/2022 13:31   US Pelvis Complete  Result Date: 11/24/2022 CLINICAL DATA:  52 year old female with postmenopausal bleeding. EXAM: TRANSABDOMINAL  ULTRASOUND OF PELVIS TECHNIQUE: Transabdominal ultrasound examination of the pelvis was performed including evaluation of the uterus, ovaries, adnexal regions, and pelvic cul-de-sac. COMPARISON:  None Available. FINDINGS: Uterus Measurements: 9.9 x 3.9 x 5.1 cm = volume: 104 mL. Uterine body and fundus myometrium seems within normal limits. But there is a bulky and lobulated lower uterine segment mass (series 1, image 22) which is approximately 6.4 x 5.4 x 7.6 cm, is solid and has internal vascular elements (series 1, image 69). This seems inseparable from the cervix. Endometrium Thickness: 6 mm. No endometrial abnormality above the lower uterine segment mass. Right ovary Measurements: 1.4 x 1.0 x 1.0 cm = volume: 1 mL. Normal appearance/no adnexal mass. Left ovary Measurements: 1.9 x 1.1 x 1.3 cm = volume: 1 mL. Normal appearance/no adnexal mass. Other findings:  No pelvis free fluid. IMPRESSION: 1. Large, approximately 7.6 cm vascular soft tissue mass of the lower uterine segment. This seems inseparable from the cervix and  Cervical Carcinoma is not excluded. Alternatively this might be a large lower uterine fibroid. Recommend direct visualization of the cervix. 2. Elsewhere the uterus and endometrium are within normal limits. Normal ovaries and no free fluid. Electronically Signed   By: Odessa Fleming M.D.   On: 11/24/2022 07:07    Procedures Procedures    Medications Ordered in ED Medications  iohexol (OMNIPAQUE) 350 MG/ML injection 75 mL (75 mLs Intravenous Contrast Given 11/24/22 1310)  iohexol (OMNIPAQUE) 9 MG/ML oral solution 500 mL (500 mLs Oral Contrast Given 11/24/22 1315)    ED Course/ Medical Decision Making/ A&P                             Medical Decision Making Amount and/or Complexity of Data Reviewed Labs: ordered. Radiology: ordered.     52 year old female postmenopausal who presents to the emergency department with intermittent vaginal bleeding that has been heavy over the past  month.  She states that it is worse upon standing.  She noticed significant passage of blood clots today which prompted her presentation to the emergency room.  She thought she might be having rectal bleeding.  She denies any fatigue, lightheadedness, shortness of breath.  She endorses crampy pelvic discomfort.  On arrival, the patient was vitally stable, afebrile, not tachycardic or tachypneic, hemodynamically stable.  Physical exam concerning for significantly dysplastic cervical tissue noted with active hemorrhage and passage of clots noted.  Initial laboratory evaluation significant for an immediate anemia to 8.8 with no baseline hemoglobin for comparison, iron studies sent and reassuring, hCG negative, CMP unremarkable.  Pelvic US: IMPRESSION:  1. Large, approximately 7.6 cm vascular soft tissue mass of the  lower uterine segment. This seems inseparable from the cervix and  Cervical Carcinoma is not excluded. Alternatively this might be a  large lower uterine fibroid. Recommend direct visualization of the  cervix.    2. Elsewhere the uterus and endometrium are within normal limits.  Normal ovaries and no free fluid.    I consulted on-call OB/GYN, Dr. Debroah Loop, who evaluated the patient bedside.  CT abdomen pelvis with and without contrast was ordered following their evaluation.  CT Abdomen Pelvis WWO: IMPRESSION:  1. Heterogeneous cervical mass is highly worrisome for cervical  carcinoma. Significant border of contact with the adjacent rectum.  Difficult to exclude invasion.  2. Locule of air within the cervical mass may be due to necrosis or  extension from the vagina. Difficult to exclude a fistula to the  rectum.     Per Dr. Debroah Loop: "Cervical mass c/w suspected cervical carcinoma. I spoke to Dr. Pricilla Holm who will see her on Friday at [facility] and she will be contacted. CT pelvis will be done prior to discharge from ED as part of her workup "  The patient was  hemodynamically stable, informed of the likely diagnosis, advised to follow-up urgently with gynecology oncology outpatient.  Final Clinical Impression(s) / ED Diagnoses Final diagnoses:  Vaginal bleeding  Rectal bleeding  Cervical mass    Rx / DC Orders ED Discharge Orders     None         Ernie Avena, MD 11/24/22 1400

## 2022-11-24 NOTE — ED Notes (Addendum)
Patient in bed

## 2022-11-24 NOTE — ED Triage Notes (Signed)
Patient reports heavy intermittent vaginal bleeding x 1 month.  Patient reports it's worse when she's standing at work.  Patient reports today she had rectal bleeding as well, making her come to the ER.

## 2022-11-24 NOTE — Discharge Instructions (Addendum)
Your ultrasound and CT imaging is concerning for invasive cervical cancer.  Please follow-up on Friday with gynecology oncology with Dr. Winferd Humphrey office.  This likely will require further aggressive management.  CT Abdomen Pelvis WWO: IMPRESSION:  1. Heterogeneous cervical mass is highly worrisome for cervical  carcinoma. Significant border of contact with the adjacent rectum.  Difficult to exclude invasion.  2. Locule of air within the cervical mass may be due to necrosis or  extension from the vagina. Difficult to exclude a fistula to the  rectum.

## 2022-11-24 NOTE — ED Notes (Signed)
Pelvic exam completed.

## 2022-11-24 NOTE — ED Notes (Signed)
This RN assisted consulted OBGYN with a pelvic exam.

## 2022-11-24 NOTE — ED Notes (Signed)
Patient transported to CT 

## 2022-11-25 ENCOUNTER — Encounter: Payer: Self-pay | Admitting: Psychiatry

## 2022-11-25 NOTE — Progress Notes (Unsigned)
GYNECOLOGIC ONCOLOGY NEW PATIENT CONSULTATION   Patient Name: Lorraine Yates  Patient Age: 52 y.o. Date of Service: 11/28/22 Referring Provider: Dr. Scheryl Darter  Primary Care Provider: Patient, No Pcp Per Consulting Provider: Eugene Garnet, MD   Assessment/Plan:  Perimenopausal patient with large cervical mass with cervical cancer.  I discussed recent CT findings with the patient as well as my exam today.  We looked at CT images together.  Based on exam, her cervix is replaced by a large tumor that I presume will be a primary cervical cancer.  Biopsy was performed today.  No pelvic adenopathy on CT.  Discussed additional imaging with PET scan to assess for metastatic disease.  In the setting of her large tumor, we discussed treatment options depending on whether there appears to be metastatic disease.  If disease is limited to the cervix, we will plan to get an MRI to assess for parametrial involvement.  This was very difficult to assess on her exam today given poorly tolerated exam.  May consider her induction chemotherapy followed by chemo RT with shorten course of brachytherapy if disease limited to the cervix (could consider interval hysterectomy).  If there is evidence of locally invasive disease static disease, we discussed plan for definitive chemo radiation.  Will get PD-L1 testing for consideration of addition of Pembro to frontline treatment.  Referrals were placed to radiation oncology as well as medical oncology today.  We discussed the role that HPV plays in the majority of cervical cancers.  Given increased risk of cervical cancer among patients with HIV, I recommended HIV testing today.  Patient was amenable.  I will call the patient with her biopsy results as well as PET scan.  Discussed trial of Preparation H given symptomatic hemorrhoids.  A copy of this note was sent to the patient's referring provider.   60 minutes of total time was spent for this patient encounter,  including preparation, face-to-face counseling with the patient and coordination of care, and documentation of the encounter.  Eugene Garnet, MD  Division of Gynecologic Oncology  Department of Obstetrics and Gynecology  [facility]  ___________________________________________  Chief Complaint: Chief Complaint  Patient presents with   Uterine mass    History of Present Illness:  Lorraine Yates is a 52 y.o. y.o. female who is seen in consultation at the request of Dr. Debroah Loop for an evaluation of a cervical mass.  The patient presented recently to the emergency department with both vaginal bleeding and rectal bleeding.  Exam in the emergency department showed a 6 cm irregular cervical mass.  Pelvic ultrasound 11/24/22: 1. Large, approximately 7.6 cm vascular soft tissue mass of the lower uterine segment. This seems inseparable from the cervix and Cervical Carcinoma is not excluded. Alternatively this might be a large lower uterine fibroid. Recommend direct visualization of the cervix. 2. Elsewhere the uterus and endometrium are within normal limits. Normal ovaries and no free fluid.  CT Pelvis 11/24/22: 1. Heterogeneous cervical mass is highly worrisome for cervical carcinoma, measures 6.3 x 7.8 cm. Significant border of contact with the adjacent rectum. Difficult to exclude invasion. 2. Locule of air within the cervical mass may be due to necrosis or extension from the vagina. Difficult to exclude a fistula to the rectum.  Hgb 8.8, creatinine 0.7.  Today, the patient reports normal monthly menses until approximately 6 months ago.  At this time, her bleeding stopped completely.  Within the last 1-1/2 to 2 months, she began having  daily bleeding again.  She uses 5 overnight pads a day but doubles up on them.  She notes that they are mostly saturated when she changes them.  She endorses passage of blood clots, initially golf ball sized and more recently have become  smaller.  She denies any pelvic or abdominal pain.  She denies any vaginal discharge.  She endorses a good appetite without nausea or emesis.  She thinks she has lost some weight because her jeans are not fitting well, unsure how much.  She endorses normal bowel bladder function.  She is married and 2 of her children live with her.  Her children are ages 53, 17, 8, and 77.  PAST MEDICAL HISTORY:  History reviewed. No pertinent past medical history.   PAST SURGICAL HISTORY:  History reviewed. No pertinent surgical history.  OB/GYN HISTORY:  OB History  Gravida Para Term Preterm AB Living  4 4          SAB IAB Ectopic Multiple Live Births               # Outcome Date GA Lbr Len/2nd Weight Sex Delivery Anes PTL Lv  4 Para           3 Para           2 Para           1 Para             No LMP recorded. (Menstrual status: Irregular Periods).  Age at menarche: 44-17 Age at menopause: n/a Hx of HRT: Denies Hx of STDs: Denies Last pap: Approximately 20 years ago at the time of her last pregnancy History of abnormal pap smears: Denies  SCREENING STUDIES:  Last mammogram: Never had  Last colonoscopy: Never had  MEDICATIONS: Outpatient Encounter Medications as of 11/26/2022  Medication Sig   [DISCONTINUED] benzonatate (TESSALON) 100 MG capsule Take 1 capsule (100 mg total) by mouth every 8 (eight) hours as needed for cough. (Patient not taking: Reported on 11/25/2022)   [DISCONTINUED] cetirizine (ZYRTEC ALLERGY) 10 MG tablet Take 1 tablet (10 mg total) by mouth daily. (Patient not taking: Reported on 08/05/2022)   [DISCONTINUED] fluticasone (FLONASE) 50 MCG/ACT nasal spray Place 2 sprays into both nostrils daily. (Patient not taking: Reported on 08/05/2022)   [DISCONTINUED] promethazine-dextromethorphan (PROMETHAZINE-DM) 6.25-15 MG/5ML syrup Take 5 mLs by mouth at bedtime as needed for cough. (Patient not taking: Reported on 08/05/2022)   [DISCONTINUED] pseudoephedrine (SUDAFED) 60 MG  tablet Take 1 tablet (60 mg total) by mouth every 8 (eight) hours as needed for congestion. (Patient not taking: Reported on 08/05/2022)   No facility-administered encounter medications on file as of 11/26/2022.    ALLERGIES:  No Known Allergies   FAMILY HISTORY:  Family History  Problem Relation Age of Onset   Alzheimer's disease Mother    Alzheimer's disease Father    Prostate cancer Father    Pancreatic cancer Neg Hx    Colon cancer Neg Hx    Breast cancer Neg Hx    Endometrial cancer Neg Hx      SOCIAL HISTORY:  Social Connections: Moderately Isolated (11/25/2022)   Social Connection and Isolation Panel [NHANES]    Frequency of Communication with Friends and Family: More than three times a week    Frequency of Social Gatherings with Friends and Family: More than three times a week    Attends Religious Services: Never    Database administrator or Organizations: No    Attends  Club or Organization Meetings: Never    Marital Status: Married    REVIEW OF SYSTEMS:  + Weight loss, hematuria, hot flashes, vaginal bleeding, migraines, easy bruising/bleeding. Denies appetite changes, fevers, chills, fatigue. Denies hearing loss, neck lumps or masses, mouth sores, ringing in ears or voice changes. Denies cough or wheezing.  Denies shortness of breath. Denies chest pain or palpitations. Denies leg swelling. Denies abdominal distention, pain, blood in stools, constipation, diarrhea, nausea, vomiting, or early satiety. Denies pain with intercourse, dysuria, frequency or incontinence. Denies pelvic pain or vaginal discharge.   Denies joint pain, back pain or muscle pain/cramps. Denies itching, rash, or wounds. Denies dizziness, numbness or seizures. Denies swollen lymph nodes or glands. Denies anxiety, depression, confusion, or decreased concentration.  Physical Exam:  Vital Signs for this encounter:  Blood pressure 106/75, pulse 88, temperature 98.8 F (37.1 C), temperature source  Oral, resp. rate 16, height 5' 4.96" (1.65 m), weight 155 lb 12.8 oz (70.7 kg), SpO2 100 %. Body mass index is 25.96 kg/m. General: Alert, oriented, no acute distress.  HEENT: Normocephalic, atraumatic. Sclera anicteric.  Chest: Clear to auscultation bilaterally. No wheezes, rhonchi, or rales. Cardiovascular: Regular rate and rhythm, no murmurs, rubs, or gallops.  Abdomen: Normoactive bowel sounds. Soft, nondistended, nontender to palpation. No masses or hepatosplenomegaly appreciated. No palpable fluid wave.  Extremities: Grossly normal range of motion. Warm, well perfused. No edema bilaterally.  Skin: No rashes or lesions.  Lymphatics: No cervical, supraclavicular, or inguinal adenopathy.  GU:  Normal external female genitalia. No lesions. No discharge or bleeding.             Bladder/urethra:  No lesions or masses, well supported bladder             Vagina: Well-rugated, no lesions appreciated.             Cervix: Cervix itself measures 6-8 cm and is completely replaced by tumor.  Very atypical vascularity noted, minimal bleeding with manipulation of the cervix.  On bimanual exam, the cervix is enlarged measuring at least 8 cm and very firm.  I do not feel any distinct vaginal involvement but I am able to reach to the vaginal apex especially on the posterior left aspect.  Given poorly tolerated rectal exam, parametrial involvement is difficult to discern.             Uterus: Somewhat difficult to appreciate given patient's intolerance to bimanual exam.  The uterus itself feels moderately mobile.             Adnexa: No masses appreciated.  Rectal: Multiple large nonbleeding hemorrhoids.  Rectovaginal exam poorly tolerated by the patient.  Cervical biopsy procedure Preoperative diagnosis: Suspected cervical cancer Postoperative diagnosis: Same as above Physician: Pricilla Holm MD Estimated blood loss: Minimal Specimens: Cervical biopsy Procedure: After the procedure was discussed with the patient  including risks and benefits, she gave consent.  She was then placed in dorsolithotomy position and a speculum was placed in the vagina.  Once the cervix was well visualized it was cleansed with Betadine x3.  Tischler forceps were used to take 2 biopsies from the anterior cervix at 12:00.  These were was placed in formalin.  Silver nitrate was used to achieve hemostasis.  Overall the patient tolerated the procedure well.  All instruments were removed from the vagina.  LABORATORY AND RADIOLOGIC DATA:  Outside medical records were reviewed to synthesize the above history, along with the history and physical obtained during the visit.   Lab  Results  Component Value Date   WBC 5.5 11/24/2022   HGB 8.8 (L) 11/24/2022   HCT 31.2 (L) 11/24/2022   PLT 511 (H) 11/24/2022   GLUCOSE 106 (H) 11/24/2022   ALT 11 11/24/2022   AST 18 11/24/2022   NA 137 11/24/2022   K 3.6 11/24/2022   CL 103 11/24/2022   CREATININE 0.70 11/24/2022   BUN 11 11/24/2022   CO2 23 11/24/2022

## 2022-11-26 ENCOUNTER — Encounter: Payer: Self-pay | Admitting: Gynecologic Oncology

## 2022-11-26 ENCOUNTER — Inpatient Hospital Stay: Payer: BC Managed Care – PPO | Attending: Gynecologic Oncology

## 2022-11-26 ENCOUNTER — Other Ambulatory Visit: Payer: Self-pay

## 2022-11-26 ENCOUNTER — Inpatient Hospital Stay (HOSPITAL_BASED_OUTPATIENT_CLINIC_OR_DEPARTMENT_OTHER): Payer: BC Managed Care – PPO | Admitting: Gynecologic Oncology

## 2022-11-26 VITALS — BP 106/75 | HR 88 | Temp 98.8°F | Resp 16 | Ht 64.96 in | Wt 155.8 lb

## 2022-11-26 DIAGNOSIS — F17210 Nicotine dependence, cigarettes, uncomplicated: Secondary | ICD-10-CM | POA: Insufficient documentation

## 2022-11-26 DIAGNOSIS — C519 Malignant neoplasm of vulva, unspecified: Secondary | ICD-10-CM | POA: Diagnosis present

## 2022-11-26 DIAGNOSIS — Z1151 Encounter for screening for human papillomavirus (HPV): Secondary | ICD-10-CM | POA: Insufficient documentation

## 2022-11-26 DIAGNOSIS — D390 Neoplasm of uncertain behavior of uterus: Secondary | ICD-10-CM | POA: Insufficient documentation

## 2022-11-26 DIAGNOSIS — D509 Iron deficiency anemia, unspecified: Secondary | ICD-10-CM | POA: Diagnosis not present

## 2022-11-26 DIAGNOSIS — K649 Unspecified hemorrhoids: Secondary | ICD-10-CM | POA: Diagnosis not present

## 2022-11-26 DIAGNOSIS — N888 Other specified noninflammatory disorders of cervix uteri: Secondary | ICD-10-CM

## 2022-11-26 DIAGNOSIS — Z114 Encounter for screening for human immunodeficiency virus [HIV]: Secondary | ICD-10-CM | POA: Diagnosis not present

## 2022-11-26 DIAGNOSIS — N939 Abnormal uterine and vaginal bleeding, unspecified: Secondary | ICD-10-CM | POA: Diagnosis not present

## 2022-11-26 DIAGNOSIS — Z8042 Family history of malignant neoplasm of prostate: Secondary | ICD-10-CM | POA: Diagnosis not present

## 2022-11-26 DIAGNOSIS — N858 Other specified noninflammatory disorders of uterus: Secondary | ICD-10-CM

## 2022-11-26 DIAGNOSIS — C539 Malignant neoplasm of cervix uteri, unspecified: Secondary | ICD-10-CM | POA: Insufficient documentation

## 2022-11-26 DIAGNOSIS — Z720 Tobacco use: Secondary | ICD-10-CM

## 2022-11-26 LAB — HIV ANTIBODY (ROUTINE TESTING W REFLEX): HIV Screen 4th Generation wRfx: NONREACTIVE

## 2022-11-26 NOTE — Patient Instructions (Signed)
It was very nice to meet you today.  Based on my exam as well as your imaging, I believe that you have cervical cancer.  I performed a biopsy today and these results should be back by early to in middle of next week.  I will call you with these results.  To help with treatment planning, I have ordered a PET scan.  This is a scan that look specifically for evidence of metastatic disease or cancer spread.  In the meantime, I am having my nurse navigator get you scheduled to see both our medical oncologist and our radiation oncologist as I anticipate a treatment plan involving both chemotherapy as well as radiation.

## 2022-11-28 DIAGNOSIS — N888 Other specified noninflammatory disorders of cervix uteri: Secondary | ICD-10-CM | POA: Insufficient documentation

## 2022-11-28 DIAGNOSIS — N939 Abnormal uterine and vaginal bleeding, unspecified: Secondary | ICD-10-CM | POA: Insufficient documentation

## 2022-11-28 DIAGNOSIS — Z720 Tobacco use: Secondary | ICD-10-CM | POA: Insufficient documentation

## 2022-11-28 DIAGNOSIS — K649 Unspecified hemorrhoids: Secondary | ICD-10-CM | POA: Insufficient documentation

## 2022-11-29 ENCOUNTER — Encounter: Payer: Self-pay | Admitting: Hematology and Oncology

## 2022-11-29 DIAGNOSIS — D509 Iron deficiency anemia, unspecified: Secondary | ICD-10-CM | POA: Insufficient documentation

## 2022-11-29 DIAGNOSIS — C539 Malignant neoplasm of cervix uteri, unspecified: Secondary | ICD-10-CM | POA: Insufficient documentation

## 2022-12-01 LAB — SURGICAL PATHOLOGY

## 2022-12-01 NOTE — Progress Notes (Signed)
GYN Location of Tumor / Histology: Cervical  PET:  Hypermetabolic cervical mass consistent with known cervical carcinoma.  There is a mildly FDG avid left pelvic sidewall lymph node which is equivocal for nodal metastasis.  CT PELVIS W CONTRAST 11/24/2022  IMPRESSION: 1. Heterogeneous cervical mass is highly worrisome for cervical carcinoma. Significant border of contact with the adjacent rectum. Difficult to exclude invasion. 2. Locule of air within the cervical mass may be due to necrosis or extension from the vagina. Difficult to exclude a fistula to the rectum.   US Pelvis Complete 11/24/2022  IMPRESSION: 1. Large, approximately 7.6 cm vascular soft tissue mass of the lower uterine segment. This seems inseparable from the cervix and Cervical Carcinoma is not excluded. Alternatively this might be a large lower uterine fibroid. Recommend direct visualization of the cervix.   2. Elsewhere the uterus and endometrium are within normal limits. Normal ovaries and no free fluid.   Lorraine Yates presented with symptoms of: bleeding, who presented with vaginal bleeding for a week that became much heavier with clots, no pain.   Biopsies revealed:  11-26-22 FINAL MICROSCOPIC DIAGNOSIS:   A. 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  See  comment.   COMMENT:   - Morphologic evaluation and p16 immunohistochemical stain reveal  superficial squamous mucosa with findings consistent with high-grade  squamous intraepithelial lesion.  The patient's clinical history of a  cervical mass is noted, and a deeper more invasive process cannot be  excluded.  Clinical and imaging correlation is recommended.   Past/Anticipated interventions by Gyn/Onc surgery, if any:  Dr. Pricilla Holm on 11-26-22 Assessment/Plan:  Perimenopausal patient with large cervical mass with cervical cancer.   I discussed recent CT findings with the patient as well as my exam today.  We  looked at CT images together.  Based on exam, her cervix is replaced by a large tumor that I presume will be a primary cervical cancer.  Biopsy was performed today.   No pelvic adenopathy on CT.  Discussed additional imaging with PET scan to assess for metastatic disease.  In the setting of her large tumor, we discussed treatment options depending on whether there appears to be metastatic disease.  If disease is limited to the cervix, we will plan to get an MRI to assess for parametrial involvement.  This was very difficult to assess on her exam today given poorly tolerated exam.  May consider her induction chemotherapy followed by chemo RT with shorten course of brachytherapy if disease limited to the cervix (could consider interval hysterectomy).  If there is evidence of locally invasive disease static disease, we discussed plan for definitive chemo radiation.  Will get PD-L1 testing for consideration of addition of Pembro to frontline treatment.   Referrals were placed to radiation oncology as well as medical oncology today.   We discussed the role that HPV plays in the majority of cervical cancers.  Given increased risk of cervical cancer among patients with HIV, I recommended HIV testing today.  Patient was amenable.   I will call the patient with her biopsy results as well as PET scan.   Discussed trial of Preparation H given symptomatic hemorrhoids.  Past/Anticipated interventions by medical oncology, if any: Pt to see Dr. Bertis Ruddy on 12-02-22.  Weight changes, if any: She reports about 4 pound weight loss over the past month.  Bowel/Bladder complaints, if any: Denies bowel or bladder changes.   Nausea/Vomiting, if any: No  Pain issues, if  any: Denies  Bleeding: She reports daily bleeding with noted clots of varying sizes.  She is wearing pads, denies soaking thru.   SAFETY ISSUES: Prior radiation? No Pacemaker/ICD? No Possible current pregnancy? Irregular cycles. Is the patient on  methotrexate? no  Current Complaints / other details:

## 2022-12-02 ENCOUNTER — Inpatient Hospital Stay (HOSPITAL_BASED_OUTPATIENT_CLINIC_OR_DEPARTMENT_OTHER): Payer: BC Managed Care – PPO | Admitting: Hematology and Oncology

## 2022-12-02 ENCOUNTER — Encounter: Payer: Self-pay | Admitting: Hematology and Oncology

## 2022-12-02 VITALS — BP 113/71 | HR 85 | Temp 98.6°F | Resp 18 | Ht 64.96 in | Wt 160.8 lb

## 2022-12-02 DIAGNOSIS — F17210 Nicotine dependence, cigarettes, uncomplicated: Secondary | ICD-10-CM

## 2022-12-02 DIAGNOSIS — C539 Malignant neoplasm of cervix uteri, unspecified: Secondary | ICD-10-CM | POA: Diagnosis not present

## 2022-12-02 DIAGNOSIS — Z8042 Family history of malignant neoplasm of prostate: Secondary | ICD-10-CM

## 2022-12-02 DIAGNOSIS — D509 Iron deficiency anemia, unspecified: Secondary | ICD-10-CM

## 2022-12-02 DIAGNOSIS — N939 Abnormal uterine and vaginal bleeding, unspecified: Secondary | ICD-10-CM | POA: Diagnosis not present

## 2022-12-02 DIAGNOSIS — Z720 Tobacco use: Secondary | ICD-10-CM

## 2022-12-02 DIAGNOSIS — D508 Other iron deficiency anemias: Secondary | ICD-10-CM

## 2022-12-02 DIAGNOSIS — C519 Malignant neoplasm of vulva, unspecified: Secondary | ICD-10-CM | POA: Diagnosis not present

## 2022-12-02 NOTE — Progress Notes (Signed)
[facility] CONSULT NOTE  Patient Care Team: Patient, No Pcp Per as PCP - General (General Practice)  ASSESSMENT & PLAN:  Cervical cancer (HCC) We discussed importance of staging I reviewed pathology report with GYN surgeon If PET/CT imaging review higher stage of disease such as stage III of 4, we might have to repeat tissue biopsy for molecular testing as she would qualify addition of pembrolizumab to future treatment However, if she has stage II disease and under, we will proceed with concurrent chemotherapy with cisplatin and radiation I will call her once PET/CT imaging results are available She would need port placement and chemo education class in the future Due to poor venous access, I will proceed to order port placement in anticipation for systemic treatment I recommend the patient to consider applying for short-term disability to focus on her health  Tobacco abuse Discussed importance of nicotine cessation and she is willing to quit on her own  Iron deficiency anemia Recommend the patient to continue to take oral iron supplement I will check iron studies in the future  Orders Placed This Encounter  Procedures   IR IMAGING GUIDED PORT INSERTION    Standing Status:   Future    Standing Expiration Date:   12/02/2023    Order Specific Question:   Reason for Exam (SYMPTOM  OR DIAGNOSIS REQUIRED)    Answer:   need port for chemo to start in 3 weeks    Order Specific Question:   Is the patient pregnant?    Answer:   No    Order Specific Question:   Preferred Imaging Location?    Answer:   [facility]    The total time spent in the appointment was 60 minutes encounter with patients including review of chart and various tests results, discussions about plan of care and coordination of care plan   All questions were answered. The patient knows to call the clinic with any problems, questions or concerns. No barriers to learning was detected.  Lorraine Delay,  MD 5/23/20242:54 PM  CHIEF COMPLAINTS/PURPOSE OF CONSULTATION:  Okay advanced cervical cancer, for further management  HISTORY OF PRESENTING ILLNESS:  Lorraine [facility] 52 y.o. female is here because of recent diagnosis of cervical cancer The patient has been complaining of abnormal vaginal bleeding for some time She also have abnormal rectal discomfort from time to time She has been craving ice and had pica for many months She does not have a primary care doctor and has not been seen on a regular basis for general health maintenance She does not have other medical problems.  She has been having ongoing menstrual bleeding for 2 months.  I have reviewed her chart and materials related to her cancer extensively and collaborated history with the patient. Summary of oncologic history is as follows: Oncology History  Cervical cancer (HCC)  11/24/2022 Initial Diagnosis   The patient presented recently to the emergency department with both vaginal bleeding and rectal bleeding.  Exam in the emergency department showed a 6 cm irregular cervical mass.     11/24/2022 Imaging   CT pelvis 1. Heterogeneous cervical mass is highly worrisome for cervical carcinoma. Significant border of contact with the adjacent rectum. Difficult to exclude invasion. 2. Locule of air within the cervical mass may be due to necrosis or extension from the vagina. Difficult to exclude a fistula to the rectum.   11/24/2022 Imaging   US pelvis 1. Large, approximately 7.6 cm vascular soft tissue mass  of the lower uterine segment. This seems inseparable from the cervix and Cervical Carcinoma is not excluded. Alternatively this might be a large lower uterine fibroid. Recommend direct visualization of the cervix.   2. Elsewhere the uterus and endometrium are within normal limits. Normal ovaries and no free fluid.   11/26/2022 Pathology Results   SURGICAL PATHOLOGY  CASE: [id number]  PATIENT: Lorraine [facility]ates  Surgical Pathology Report    FINAL MICROSCOPIC DIAGNOSIS:   A. 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  See comment.   COMMENT:   - Morphologic evaluation and p16 immunohistochemical stain reveal superficial squamous mucosa with findings consistent with high-grade squamous intraepithelial lesion.  The patient's clinical history of a cervical mass is noted, and a deeper more invasive process cannot be excluded.  Clinical and imaging correlation is recommended.      11/29/2022 Initial Diagnosis   Cervical cancer [facility])     MEDICAL HISTORY:  Past Medical History:  Diagnosis Date   Anemia     SURGICAL HISTORY: History reviewed. No pertinent surgical history.  SOCIAL HISTORY: Social History   Socioeconomic History   Marital status: Married    Spouse name: Not on file   Number of children: 2   Years of education: Not on file   Highest education level: Not on file  Occupational History   Occupation: personal shopper  Tobacco Use   Smoking status: Every Day    Packs/day: .15    Types: Cigarettes   Smokeless tobacco: Never   Tobacco comments:    Socially - 1 pack per week  Vaping Use   Vaping Use: Never used  Substance and Sexual Activity   Alcohol use: Yes    Comment: socially   Drug use: Yes    Types: Marijuana    Comment: THC - once q 2 weeks   Sexual activity: Not Currently  Other Topics Concern   Not on file  Social History Narrative   Not on file   Social Determinants of Health   Financial Resource Strain: Not on file  Food Insecurity: No Food Insecurity (11/25/2022)   Hunger Vital Sign    Worried About Running Out of Food in the Last Year: Never true    Ran Out of Food in the Last Year: Never true  Transportation Needs: No Transportation Needs (11/25/2022)   PRAPARE - Administrator, Civil Service (Medical): No    Lack of Transportation (Non-Medical): No  Physical Activity: Not on file  Stress: Not on file  Social Connections:  Moderately Isolated (11/25/2022)   Social Connection and Isolation Panel [NHANES]    Frequency of Communication with Friends and Family: More than three times a week    Frequency of Social Gatherings with Friends and Family: More than three times a week    Attends Religious Services: Never    Database administrator or Organizations: No    Attends Engineer, structural: Never    Marital Status: Married  Catering manager Violence: Not on file    FAMILY HISTORY: Family History  Problem Relation Age of Onset   Alzheimer's disease Mother    Alzheimer's disease Father    Prostate cancer Father    Pancreatic cancer Neg Hx    Colon cancer Neg Hx    Breast cancer Neg Hx    Endometrial cancer Neg Hx     ALLERGIES:  has No Known Allergies.  MEDICATIONS:  Current Outpatient Medications  Medication Sig Dispense  Refill   ferrous sulfate 324 MG TBEC Take 324 mg by mouth 2 (two) times daily.     No current facility-administered medications for this visit.    REVIEW OF SYSTEMS: She complained of intermittent aches in her legs but denies swelling Constitutional: Denies fevers, chills or abnormal night sweats Eyes: Denies blurriness of vision, double vision or watery eyes Ears, nose, mouth, throat, and face: Denies mucositis or sore throat Respiratory: Denies cough, dyspnea or wheezes Cardiovascular: Denies palpitation, chest discomfort or lower extremity swelling Gastrointestinal:  Denies nausea, heartburn or change in bowel habits Skin: Denies abnormal skin rashes Lymphatics: Denies new lymphadenopathy or easy bruising Neurological:Denies numbness, tingling or new weaknesses Behavioral/Psych: Mood is stable, no new changes  All other systems were reviewed with the patient and are negative.  PHYSICAL EXAMINATION: ECOG PERFORMANCE STATUS: 1 - Symptomatic but completely ambulatory  Vitals:   12/02/22 1348  BP: 113/71  Pulse: 85  Resp: 18  Temp: 98.6 F (37 C)  SpO2: 100%    Filed Weights   12/02/22 1348  Weight: 160 lb 12.8 oz (72.9 kg)    GENERAL:alert, no distress and comfortable SKIN: skin color, texture, turgor are normal, no rashes or significant lesions EYES: normal, conjunctiva are pink and non-injected, sclera clear OROPHARYNX:no exudate, no erythema and lips, buccal mucosa, and tongue normal  NECK: supple, thyroid normal size, non-tender, without nodularity LYMPH:  no palpable lymphadenopathy in the cervical, axillary or inguinal LUNGS: clear to auscultation and percussion with normal breathing effort HEART: regular rate & rhythm and no murmurs and no lower extremity edema ABDOMEN:abdomen soft, non-tender and normal bowel sounds Musculoskeletal:no cyanosis of digits and no clubbing  PSYCH: alert & oriented x 3 with fluent speech NEURO: no focal motor/sensory deficits  LABORATORY DATA:  I have reviewed the data as listed Lab Results  Component Value Date   WBC 5.5 11/24/2022   HGB 8.8 (L) 11/24/2022   HCT 31.2 (L) 11/24/2022   MCV 81.7 11/24/2022   PLT 511 (H) 11/24/2022   Recent Labs    11/24/22 0500  NA 137  K 3.6  CL 103  CO2 23  GLUCOSE 106*  BUN 11  CREATININE 0.70  CALCIUM 9.4  GFRNONAA >60  PROT 7.1  ALBUMIN 3.6  AST 18  ALT 11  ALKPHOS 69  BILITOT 0.1*    RADIOGRAPHIC STUDIES: I have personally reviewed the radiological images as listed and agreed with the findings in the report. CT PELVIS W CONTRAST  Result Date: 11/24/2022 CLINICAL DATA:  Cervical cancer, staging. Heavy vaginal bleeding. Rectal bleeding. EXAM: CT PELVIS WITH CONTRAST TECHNIQUE: Multidetector CT imaging of the pelvis was performed using the standard protocol following the bolus administration of intravenous contrast. RADIATION DOSE REDUCTION: This exam was performed according to the departmental dose-optimization program which includes automated exposure control, adjustment of the mA and/or kV according to patient size and/or use of iterative  reconstruction technique. CONTRAST:  75mL OMNIPAQUE IOHEXOL 350 MG/ML SOLN COMPARISON:  Pelvic ultrasound 11/24/2022. FINDINGS: Urinary Tract:  None. Bowel: Visualized bowel is grossly unremarkable. The below described cervical mass does contact the rectum. Vascular/Lymphatic: Vascular structures are unremarkable. Scattered iliac chain lymph nodes are not enlarged by CT size criteria. Reproductive: Heterogeneous cervical mass measures 6.3 x 7.8 cm and contains at least 1 locule of air. Uterus is visualized. No adnexal mass. Other:  No free fluid. Musculoskeletal: None. IMPRESSION: 1. Heterogeneous cervical mass is highly worrisome for cervical carcinoma. Significant border of contact with the adjacent rectum.  Difficult to exclude invasion. 2. Locule of air within the cervical mass may be due to necrosis or extension from the vagina. Difficult to exclude a fistula to the rectum. Electronically Signed   By: Leanna Battles M.D.   On: 11/24/2022 13:31   US Pelvis Complete  Result Date: 11/24/2022 CLINICAL DATA:  52 year old female with postmenopausal bleeding. EXAM: TRANSABDOMINAL ULTRASOUND OF PELVIS TECHNIQUE: Transabdominal ultrasound examination of the pelvis was performed including evaluation of the uterus, ovaries, adnexal regions, and pelvic cul-de-sac. COMPARISON:  None Available. FINDINGS: Uterus Measurements: 9.9 x 3.9 x 5.1 cm = volume: 104 mL. Uterine body and fundus myometrium seems within normal limits. But there is a bulky and lobulated lower uterine segment mass (series 1, image 22) which is approximately 6.4 x 5.4 x 7.6 cm, is solid and has internal vascular elements (series 1, image 69). This seems inseparable from the cervix. Endometrium Thickness: 6 mm. No endometrial abnormality above the lower uterine segment mass. Right ovary Measurements: 1.4 x 1.0 x 1.0 cm = volume: 1 mL. Normal appearance/no adnexal mass. Left ovary Measurements: 1.9 x 1.1 x 1.3 cm = volume: 1 mL. Normal appearance/no  adnexal mass. Other findings:  No pelvis free fluid. IMPRESSION: 1. Large, approximately 7.6 cm vascular soft tissue mass of the lower uterine segment. This seems inseparable from the cervix and Cervical Carcinoma is not excluded. Alternatively this might be a large lower uterine fibroid. Recommend direct visualization of the cervix. 2. Elsewhere the uterus and endometrium are within normal limits. Normal ovaries and no free fluid. Electronically Signed   By: Odessa Fleming M.D.   On: 11/24/2022 07:07

## 2022-12-02 NOTE — Assessment & Plan Note (Signed)
Discussed importance of nicotine cessation and she is willing to quit on her own

## 2022-12-02 NOTE — Assessment & Plan Note (Signed)
Recommend the patient to continue to take oral iron supplement I will check iron studies in the future

## 2022-12-02 NOTE — Assessment & Plan Note (Signed)
We discussed importance of staging I reviewed pathology report with GYN surgeon If PET/CT imaging review higher stage of disease such as stage III of 4, we might have to repeat tissue biopsy for molecular testing as she would qualify addition of pembrolizumab to future treatment However, if she has stage II disease and under, we will proceed with concurrent chemotherapy with cisplatin and radiation I will call her once PET/CT imaging results are available She would need port placement and chemo education class in the future Due to poor venous access, I will proceed to order port placement in anticipation for systemic treatment I recommend the patient to consider applying for short-term disability to focus on her health

## 2022-12-03 ENCOUNTER — Encounter: Payer: Self-pay | Admitting: Gynecologic Oncology

## 2022-12-03 NOTE — Progress Notes (Signed)
Peer to peer performed for PET scan. PET scan authorized. MD with insurance company states you must wait at least 1 business day in order to get the auth #. Our auth team notified of this approval.

## 2022-12-07 ENCOUNTER — Telehealth: Payer: Self-pay

## 2022-12-07 NOTE — Telephone Encounter (Signed)
Returned her call. She is not gong to be able to work due to heavy lifting at work. She had a large amount of vaginal bleeding today at work and had to leave. She needs the paper work returned to work by 6/12 and needs continuous leave. Forwarded her message to the department that fill out the paper work.  The bleeding slowed down once she got home. Told her to call the office back if bleeding gets heavier or if she is concerned. She Verbalized understanding.

## 2022-12-14 ENCOUNTER — Encounter (HOSPITAL_COMMUNITY)
Admission: RE | Admit: 2022-12-14 | Discharge: 2022-12-14 | Disposition: A | Discharge status: Home / Self Care | Payer: BC Managed Care – PPO | Source: Ambulatory Visit | Attending: Gynecologic Oncology | Admitting: Gynecologic Oncology

## 2022-12-14 DIAGNOSIS — N888 Other specified noninflammatory disorders of cervix uteri: Secondary | ICD-10-CM | POA: Insufficient documentation

## 2022-12-14 LAB — GLUCOSE, CAPILLARY: Glucose-Capillary: 81 mg/dL (ref 70–99)

## 2022-12-14 MED ORDER — FLUDEOXYGLUCOSE F - 18 (FDG) INJECTION
7.9900 | Freq: Once | INTRAVENOUS | Status: AC | PRN
  Administered 2022-12-14: 7.99 via INTRAVENOUS

## 2022-12-14 NOTE — Progress Notes (Signed)
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 female who is seen as a courtesy of Dr. Pricilla Holm for an opinion concerning radiation therapy as part of management for her recently diagnosed cervical cancer.   The patient presented to the ED on 11/24/22 with c/o heavy intermittent postmenopausal vaginal bleeding x 1 month. She characterized her bleeding as worse with standing and noted passing large blood clots prior to presenting to the ED. GU exam performed in the ED was notable for significantly dysplastic cervical tissue with active bleeding consisting of golf ball sized clots. Imaging performed in the ID included:  -- Pelvic ultrasound which demonstrated a large, approximately 7.6 cm vascular soft tissue mass located in the lower uterine segment. The mass appeared to be inseparable from the cervix. Differential considerations included cervical carcinoma vs a large lower uterine fibroid. Uterus, endometrium, and bilateral ovaries otherwise appeared unremarkable.  -- Pelvic CT also demonstrated the heterogeneous cervical mass with features highly worrisome for cervical carcinoma. The mass was also seen to make significant contact with the adjacent rectum concerning for potential invasion. Locule of air within the cervical mass was also appreciated concerning for either  necrosis or extension from the vagina, however a rectal fistula could not be entirely excluded.  She was subsequently referred to Dr. Pricilla Holm on 11/26/22 for further evaluation and management. During this visit, the patient reported having normal menses until 6 months ago. At that time, her bleeding stopped completely and later resumed several months ago with daily bleeding. She also reported a decrease in size of clots and denied any pelvic or abdominal pain. GU exam performed during this visit noted: complete replacement of the cervix by tumor; atypical vascularity, an enlarged and very firm cervix measuring at least 8 cm. The patient tolerated her pelvic exam poorly and thus limited further examination.   Cervical biopsy collected by Dr. 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 In light of her history of cervical mass, a deeper more invasive process cannot be entirely excluded. (HIV testing was collected that same day and came back negative).   If her pending PET scan shows disease  limited to the cervix, Dr. Pricilla Holm would like to obtain an MRI to assess for parametrial involvement given that she tolerated her GU exam poorly. In terms of treatment options, Dr. Pricilla Holm may consider her for induction chemotherapy followed by chemo RT with a shortened course of brachytherapy if her disease is limited to the cervix. Another consideration would be an interval hysterectomy.  If there is evidence of locally invasive disease, Dr. Pricilla Holm recommends definitive chemo radiation.  She was accordingly referred to Dr. Bertis Ruddy on 12/02/22 to discuss treatment options. If her PET scan comes back showing a higher stage of disease such as stage III of 4, Dr. Bertis Ruddy recommends possibly proceeding with repeat tissue sampling for molecular testing to  determine if she qualifies for pembrolizumab to be added to future treatment. However, if PET imaging shows stage II disease or under, Dr. Bertis Ruddy  recommends proceeding with concurrent chemotherapy with cisplatin and radiation.   Due to poor venous access, Dr. Bertis Ruddy has already placed an order for port placement in anticipation of systemic treatment   Pertinent imaging performed thus far includes a PET scan performed yesterday (12/13/22) which demonstrated: the hypermetabolic cervix mass measuring 8.3 cm, consistent with the biopsy proven cervical carcinoma. A mildly FDG avid left pelvic sidewall lymph node was also appreciated, equivocal for nodal metastasis. PET otherwise showed no additional sites of disease.   Other symptoms include occasional rectal discomfort from time to time.   Today patient states she is experiencing vaginal bleeding daily with clots of varying sizes. On average, they are about the size of a quarter in diameter. She wears pads and denies soaking through the pads. She has experienced an unintended 4 lb weight loss this past month. She denies any fevers, chills, or abdominal pain.   PREVIOUS RADIATION THERAPY: No  PAST MEDICAL HISTORY:  Past Medical History:  Diagnosis Date   Anemia     PAST SURGICAL HISTORY:History reviewed. No pertinent surgical history.  FAMILY HISTORY:  Family History  Problem Relation Age of Onset   Alzheimer's disease Mother    Alzheimer's disease Father    Prostate cancer Father    Pancreatic cancer Neg Hx    Colon cancer Neg Hx    Breast cancer Neg Hx    Endometrial cancer Neg Hx     SOCIAL HISTORY:  Social History   Tobacco Use   Smoking status: Every Day    Packs/day: .15    Types: Cigarettes   Smokeless tobacco: Never   Tobacco comments:    Socially - 1 pack per week  Vaping Use   Vaping Use: Never used  Substance Use Topics   Alcohol use: Yes    Comment: socially   Drug use: Yes    Types: Marijuana    Comment: THC - once q 2 weeks    ALLERGIES: No Known Allergies  MEDICATIONS:  Current Outpatient Medications  Medication Sig Dispense Refill   ferrous  sulfate 324 MG TBEC Take 324 mg by mouth 2 (two) times daily.     No current facility-administered medications for this encounter.    REVIEW OF SYSTEMS:  As per HPI.    PHYSICAL EXAM:  height is 5\' 5"  (1.651 m) and weight is 157 lb (71.2 kg). Her temporal temperature is 97.5 F (36.4 C) (abnormal). Her blood pressure is 105/69 and her pulse is 109 (abnormal). Her respiration is 18 and oxygen saturation is 100%.   General: Alert and oriented, in no acute distress, accompanied by her husband HEENT: Head is normocephalic. Extraocular movements are intact.  Neck: Neck is supple, no palpable cervical or supraclavicular lymphadenopathy. Heart: Regular in rate and rhythm with no murmurs, rubs, or gallops. Chest: Clear to auscultation bilaterally, with no rhonchi, wheezes, or rales. Abdomen: Soft, nontender, nondistended, with no rigidity or guarding. Extremities: No cyanosis or edema. Lymphatics: see Neck Exam Skin: No concerning lesions. Musculoskeletal: symmetric strength and muscle tone throughout. Neurologic: Cranial nerves II through XII are grossly intact. No obvious focalities. Speech is fluent. Coordination is intact. Psychiatric: Judgment and insight are intact. Affect is appropriate.  On pelvic examination the external genitalia were unremarkable. A speculum exam was performed. Bleeding noted with manipulation of the cervix.  On visualization  the  cervix has an unusual vascular appearance.  The cervical mass protrudes halfway down the vaginal vault.  Detailed exam is not possible given the patient's intolerance of the exam.  Rectal exam is not performed today.  Cervical mass quite firm consistent with malignancy.  ECOG = 1  0 - Asymptomatic (Fully active, able to carry on all predisease activities without restriction)  1 - Symptomatic but completely ambulatory (Restricted in physically strenuous activity but ambulatory and able to carry out work of a light or sedentary nature. For  example, light housework, office work)  2 - Symptomatic, <50% in bed during the day (Ambulatory and capable of all self care but unable to carry out any work activities. Up and about more than 50% of waking hours)  3 - Symptomatic, >50% in bed, but not bedbound (Capable of only limited self-care, confined to bed or chair 50% or more of waking hours)  4 - Bedbound (Completely disabled. Cannot carry on any self-care. Totally confined to bed or chair)  5 - Death   Santiago Glad MM, Creech RH, Tormey DC, et al. [phone]). "Toxicity and response criteria of the [facility]". Am. Evlyn Clines. Oncol. 5 (6): 649-55  LABORATORY DATA:  Lab Results  Component Value Date   WBC 5.5 11/24/2022   HGB 8.8 (L) 11/24/2022   HCT 31.2 (L) 11/24/2022   MCV 81.7 11/24/2022   PLT 511 (H) 11/24/2022   NEUTROABS 3.2 11/24/2022   Lab Results  Component Value Date   NA 137 11/24/2022   K 3.6 11/24/2022   CL 103 11/24/2022   CO2 23 11/24/2022   GLUCOSE 106 (H) 11/24/2022   BUN 11 11/24/2022   CREATININE 0.70 11/24/2022   CALCIUM 9.4 11/24/2022      RADIOGRAPHY: NM PET Image Initial (PI) Skull Base To Thigh (F-18 FDG)  Result Date: 12/15/2022 CLINICAL DATA:  Initial treatment strategy for cervical cancer. EXAM: NUCLEAR MEDICINE PET SKULL BASE TO THIGH TECHNIQUE: 7.99 mCi F-18 FDG was injected intravenously. Full-ring PET imaging was performed from the skull base to thigh after the radiotracer. CT data was obtained and used for attenuation correction and anatomic localization. Fasting blood glucose: 81 mg/dl COMPARISON:  CT pelvis 11/24/2022 FINDINGS: Mediastinal blood pool activity: SUV max 2.34 Liver activity: SUV max NA NECK: No hypermetabolic lymph nodes in the neck. Incidental CT findings: None. CHEST: No hypermetabolic mediastinal or hilar nodes. No suspicious pulmonary nodules on the CT scan. Incidental CT findings: Mild cardiac enlargement. Aortic atherosclerosis and coronary artery  calcification ABDOMEN/PELVIS: No abnormal tracer uptake identified within the liver, pancreas, spleen, or adrenal glands. No tracer avid lymph nodes within the abdomen. There is a left pelvic sidewall lymph node which is mildly FDG avid measuring 6 mm short axis with SUV max of 2.86, image 168/4. Tracer avid cervical mass measures 8.3 x 6.0 cm with SUV max 18.83, image 175/4. Incidental CT findings: No ascites. No signs of peritoneal nodularity. SKELETON: No focal hypermetabolic activity to suggest skeletal metastasis. Incidental CT findings: None. IMPRESSION: 1. Hypermetabolic cervical mass consistent with known cervical carcinoma. 2. There is a mildly FDG avid left pelvic sidewall lymph node which is equivocal for nodal metastasis. 3. No additional sites of disease identified. 4. Aortic Atherosclerosis (ICD10-I70.0). Electronically Signed   By: Signa Kell M.D.   On: 12/15/2022 08:36   CT PELVIS W CONTRAST  Result Date: 11/24/2022 CLINICAL DATA:  Cervical cancer, staging. Heavy vaginal bleeding. Rectal bleeding. EXAM: CT PELVIS WITH CONTRAST TECHNIQUE: Multidetector CT imaging  of the pelvis was performed using the standard protocol following the bolus administration of intravenous contrast. RADIATION DOSE REDUCTION: This exam was performed according to the departmental dose-optimization program which includes automated exposure control, adjustment of the mA and/or kV according to patient size and/or use of iterative reconstruction technique. CONTRAST:  75mL OMNIPAQUE IOHEXOL 350 MG/ML SOLN COMPARISON:  Pelvic ultrasound 11/24/2022. FINDINGS: Urinary Tract:  None. Bowel: Visualized bowel is grossly unremarkable. The below described cervical mass does contact the rectum. Vascular/Lymphatic: Vascular structures are unremarkable. Scattered iliac chain lymph nodes are not enlarged by CT size criteria. Reproductive: Heterogeneous cervical mass measures 6.3 x 7.8 cm and contains at least 1 locule of air. Uterus is  visualized. No adnexal mass. Other:  No free fluid. Musculoskeletal: None. IMPRESSION: 1. Heterogeneous cervical mass is highly worrisome for cervical carcinoma. Significant border of contact with the adjacent rectum. Difficult to exclude invasion. 2. Locule of air within the cervical mass may be due to necrosis or extension from the vagina. Difficult to exclude a fistula to the rectum. Electronically Signed   By: Leanna Battles M.D.   On: 11/24/2022 13:31   US Pelvis Complete  Result Date: 11/24/2022 CLINICAL DATA:  52 year old female with postmenopausal bleeding. EXAM: TRANSABDOMINAL ULTRASOUND OF PELVIS TECHNIQUE: Transabdominal ultrasound examination of the pelvis was performed including evaluation of the uterus, ovaries, adnexal regions, and pelvic cul-de-sac. COMPARISON:  None Available. FINDINGS: Uterus Measurements: 9.9 x 3.9 x 5.1 cm = volume: 104 mL. Uterine body and fundus myometrium seems within normal limits. But there is a bulky and lobulated lower uterine segment mass (series 1, image 22) which is approximately 6.4 x 5.4 x 7.6 cm, is solid and has internal vascular elements (series 1, image 69). This seems inseparable from the cervix. Endometrium Thickness: 6 mm. No endometrial abnormality above the lower uterine segment mass. Right ovary Measurements: 1.4 x 1.0 x 1.0 cm = volume: 1 mL. Normal appearance/no adnexal mass. Left ovary Measurements: 1.9 x 1.1 x 1.3 cm = volume: 1 mL. Normal appearance/no adnexal mass. Other findings:  No pelvis free fluid. IMPRESSION: 1. Large, approximately 7.6 cm vascular soft tissue mass of the lower uterine segment. This seems inseparable from the cervix and Cervical Carcinoma is not excluded. Alternatively this might be a large lower uterine fibroid. Recommend direct visualization of the cervix. 2. Elsewhere the uterus and endometrium are within normal limits. Normal ovaries and no free fluid. Electronically Signed   By: Odessa Fleming M.D.   On: 11/24/2022 07:07       IMPRESSION: Cervical biopsies consistent with HSIL; cannot exclude a deeper more invasive process   Recent PET imaging reveals a mildly FDG-avid pelvic lymph node, suggesting  possible stage III disease.  Distant metastatic disease not seen on PET scan.  MRI ordered today to further evaluate the FDG-avid node and to assess potential parametrial involvement.   Given the unusual appearance of the cervical mass would favor repeat biopsy to confirm invasive disease, histologic subtype and to evaluate for molecular testing.  Today we discussed the general course of definitive radiation therapy including external beam and brachytherapy treatments.  She would like to proceed with this treatment if this is the ultimate recommendation from her physicians.    PLAN: MRI of her pelvis ordered today. We will await final MRI results prior to proceeding with any treatment plan. Given her current situation,  Dr. Roselind Messier anticipates 5 weeks of concurrent chemoradiation followed by brachytherapy. He plans to discuss this case with Dr. Pricilla Holm.  Patient's case will also be presented at the multidisciplinary gynecologic oncology conference in the near future.  CBC with diff also ordered to obtain baseline hemoglobin levels. Patient's last hemoglobin 3 weeks ago was low at 8.8.  60 minutes of total time was spent for this patient encounter, including preparation, face-to-face counseling with the patient and coordination of care, physical exam, and documentation of the encounter.   ------------------------------------------------   Joyice Faster, PA-C   Billie Lade, PhD, MD  This document serves as a record of services personally performed by Antony Blackbird, MD. It was created on his behalf by Neena Rhymes, a trained medical scribe. The creation of this record is based on the scribe's personal observations and the provider's statements to them. This document has been checked and approved by the attending  provider.

## 2022-12-15 ENCOUNTER — Ambulatory Visit
Admission: RE | Admit: 2022-12-15 | Discharge: 2022-12-15 | Disposition: A | Discharge status: Home / Self Care | Payer: BC Managed Care – PPO | Source: Ambulatory Visit | Attending: Radiation Oncology | Admitting: Radiation Oncology

## 2022-12-15 ENCOUNTER — Other Ambulatory Visit: Payer: Self-pay | Admitting: Radiology

## 2022-12-15 ENCOUNTER — Encounter: Payer: Self-pay | Admitting: Radiation Oncology

## 2022-12-15 VITALS — BP 105/69 | HR 109 | Temp 97.5°F | Resp 18 | Ht 65.0 in | Wt 157.0 lb

## 2022-12-15 DIAGNOSIS — C531 Malignant neoplasm of exocervix: Secondary | ICD-10-CM

## 2022-12-15 NOTE — Consult Note (Signed)
Chief Complaint: Patient was seen in consultation today for port a cath placement  Referring Physician(s): Gorsuch,Ni  Supervising Physician: Simonne Come  Patient Status: [facility] - Out-pt  History of Present Illness: Lorraine Yates is a 52 y.o. female smoker with PMH anemia who presents now with newly diagnosed cervical carcinoma. She has poor venous access and is scheduled today for port a cath placement to assist with treatment.     Past Medical History:  Diagnosis Date   Anemia     No past surgical history on file.  Allergies: Patient has no known allergies.  Medications: Prior to Admission medications   Medication Sig Start Date End Date Taking? Authorizing Provider  ferrous sulfate 324 MG TBEC Take 324 mg by mouth 2 (two) times daily.    [provider]     Family History  Problem Relation Age of Onset   Alzheimer's disease Mother    Alzheimer's disease Father    Prostate cancer Father    Pancreatic cancer Neg Hx    Colon cancer Neg Hx    Breast cancer Neg Hx    Endometrial cancer Neg Hx     Social History   Socioeconomic History   Marital status: Married    Spouse name: Not on file   Number of children: 2   Years of education: Not on file   Highest education level: Not on file  Occupational History   Occupation: personal shopper  Tobacco Use   Smoking status: Every Day    Packs/day: .15    Types: Cigarettes   Smokeless tobacco: Never   Tobacco comments:    Socially - 1 pack per week  Vaping Use   Vaping Use: Never used  Substance and Sexual Activity   Alcohol use: Yes    Comment: socially   Drug use: Yes    Types: Marijuana    Comment: THC - once q 2 weeks   Sexual activity: Not Currently  Other Topics Concern   Not on file  Social History Narrative   Not on file   Social Determinants of Health   Financial Resource Strain: Not on file  Food Insecurity: No Food Insecurity (12/15/2022)   Hunger Vital Sign    Worried About  Running Out of Food in the Last Year: Never true    Ran Out of Food in the Last Year: Never true  Transportation Needs: No Transportation Needs (12/15/2022)   PRAPARE - Administrator, Civil Service (Medical): No    Lack of Transportation (Non-Medical): No  Physical Activity: Not on file  Stress: Not on file  Social Connections: Moderately Isolated (11/25/2022)   Social Connection and Isolation Panel [NHANES]    Frequency of Communication with Friends and Family: More than three times a week    Frequency of Social Gatherings with Friends and Family: More than three times a week    Attends Religious Services: Never    Database administrator or Organizations: No    Attends Banker Meetings: Never    Marital Status: Married      Review of Systems; denies fever,HA,CP,dyspnea, cough, abd /back pain,N/V; she has vaginal bleeding  Vital Signs: Vitals:   12/16/22 0915  BP: 106/64  Pulse: 91  Resp: 18  Temp: 99.4 F (37.4 C)  SpO2: 100%       Code Status: FULL CODE    Physical Exam: awake/alert; chest- CTA bilat; heart- RRR; abd- soft,+BS,NT; no LE edema  Imaging:  NM PET Image Initial (PI) Skull Base To Thigh (F-18 FDG)  Result Date: 12/15/2022 CLINICAL DATA:  Initial treatment strategy for cervical cancer. EXAM: NUCLEAR MEDICINE PET SKULL BASE TO THIGH TECHNIQUE: 7.99 mCi F-18 FDG was injected intravenously. Full-ring PET imaging was performed from the skull base to thigh after the radiotracer. CT data was obtained and used for attenuation correction and anatomic localization. Fasting blood glucose: 81 mg/dl COMPARISON:  CT pelvis 11/24/2022 FINDINGS: Mediastinal blood pool activity: SUV max 2.34 Liver activity: SUV max NA NECK: No hypermetabolic lymph nodes in the neck. Incidental CT findings: None. CHEST: No hypermetabolic mediastinal or hilar nodes. No suspicious pulmonary nodules on the CT scan. Incidental CT findings: Mild cardiac enlargement. Aortic  atherosclerosis and coronary artery calcification ABDOMEN/PELVIS: No abnormal tracer uptake identified within the liver, pancreas, spleen, or adrenal glands. No tracer avid lymph nodes within the abdomen. There is a left pelvic sidewall lymph node which is mildly FDG avid measuring 6 mm short axis with SUV max of 2.86, image 168/4. Tracer avid cervical mass measures 8.3 x 6.0 cm with SUV max 18.83, image 175/4. Incidental CT findings: No ascites. No signs of peritoneal nodularity. SKELETON: No focal hypermetabolic activity to suggest skeletal metastasis. Incidental CT findings: None. IMPRESSION: 1. Hypermetabolic cervical mass consistent with known cervical carcinoma. 2. There is a mildly FDG avid left pelvic sidewall lymph node which is equivocal for nodal metastasis. 3. No additional sites of disease identified. 4. Aortic Atherosclerosis (ICD10-I70.0). Electronically Signed   By: Signa Kell M.D.   On: 12/15/2022 08:36   CT PELVIS W CONTRAST  Result Date: 11/24/2022 CLINICAL DATA:  Cervical cancer, staging. Heavy vaginal bleeding. Rectal bleeding. EXAM: CT PELVIS WITH CONTRAST TECHNIQUE: Multidetector CT imaging of the pelvis was performed using the standard protocol following the bolus administration of intravenous contrast. RADIATION DOSE REDUCTION: This exam was performed according to the departmental dose-optimization program which includes automated exposure control, adjustment of the mA and/or kV according to patient size and/or use of iterative reconstruction technique. CONTRAST:  75mL OMNIPAQUE IOHEXOL 350 MG/ML SOLN COMPARISON:  Pelvic ultrasound 11/24/2022. FINDINGS: Urinary Tract:  None. Bowel: Visualized bowel is grossly unremarkable. The below described cervical mass does contact the rectum. Vascular/Lymphatic: Vascular structures are unremarkable. Scattered iliac chain lymph nodes are not enlarged by CT size criteria. Reproductive: Heterogeneous cervical mass measures 6.3 x 7.8 cm and contains  at least 1 locule of air. Uterus is visualized. No adnexal mass. Other:  No free fluid. Musculoskeletal: None. IMPRESSION: 1. Heterogeneous cervical mass is highly worrisome for cervical carcinoma. Significant border of contact with the adjacent rectum. Difficult to exclude invasion. 2. Locule of air within the cervical mass may be due to necrosis or extension from the vagina. Difficult to exclude a fistula to the rectum. Electronically Signed   By: Leanna Battles M.D.   On: 11/24/2022 13:31   US Pelvis Complete  Result Date: 11/24/2022 CLINICAL DATA:  52 year old female with postmenopausal bleeding. EXAM: TRANSABDOMINAL ULTRASOUND OF PELVIS TECHNIQUE: Transabdominal ultrasound examination of the pelvis was performed including evaluation of the uterus, ovaries, adnexal regions, and pelvic cul-de-sac. COMPARISON:  None Available. FINDINGS: Uterus Measurements: 9.9 x 3.9 x 5.1 cm = volume: 104 mL. Uterine body and fundus myometrium seems within normal limits. But there is a bulky and lobulated lower uterine segment mass (series 1, image 22) which is approximately 6.4 x 5.4 x 7.6 cm, is solid and has internal vascular elements (series 1, image 69). This seems inseparable from the cervix.  Endometrium Thickness: 6 mm. No endometrial abnormality above the lower uterine segment mass. Right ovary Measurements: 1.4 x 1.0 x 1.0 cm = volume: 1 mL. Normal appearance/no adnexal mass. Left ovary Measurements: 1.9 x 1.1 x 1.3 cm = volume: 1 mL. Normal appearance/no adnexal mass. Other findings:  No pelvis free fluid. IMPRESSION: 1. Large, approximately 7.6 cm vascular soft tissue mass of the lower uterine segment. This seems inseparable from the cervix and Cervical Carcinoma is not excluded. Alternatively this might be a large lower uterine fibroid. Recommend direct visualization of the cervix. 2. Elsewhere the uterus and endometrium are within normal limits. Normal ovaries and no free fluid. Electronically Signed   By: Odessa Fleming M.D.   On: 11/24/2022 07:07    Labs:  CBC: Recent Labs    11/24/22 0500  WBC 5.5  HGB 8.8*  HCT 31.2*  PLT 511*    COAGS: No results for input(s): "INR", "APTT" in the last 8760 hours.  BMP: Recent Labs    11/24/22 0500  NA 137  K 3.6  CL 103  CO2 23  GLUCOSE 106*  BUN 11  CALCIUM 9.4  CREATININE 0.70  GFRNONAA >60    LIVER FUNCTION TESTS: Recent Labs    11/24/22 0500  BILITOT 0.1*  AST 18  ALT 11  ALKPHOS 69  PROT 7.1  ALBUMIN 3.6    TUMOR MARKERS: No results for input(s): "AFPTM", "CEA", "CA199", "CHROMGRNA" in the last 8760 hours.  Assessment and Plan: 52 y.o. female smoker with PMH anemia who presents now with newly diagnosed cervical carcinoma. She has poor venous access and is scheduled today for port a cath placement to assist with treatment.Risks and benefits of image guided port-a-catheter placement was discussed with the patient including, but not limited to bleeding, infection, pneumothorax, or fibrin sheath development and need for additional procedures.  All of the patient's questions were answered, patient is agreeable to proceed. Consent signed and in chart.    Thank you for this interesting consult.  I greatly enjoyed meeting [employer] [facility] BEDIENT and look forward to participating in their care.  A copy of this report was sent to the requesting provider on this date.  Electronically Signed: D. Jeananne Rama, PA-C 12/15/2022, 4:30 PM   I spent a total of  25 minutes   in face to face in clinical consultation, greater than 50% of which was counseling/coordinating care for port a cath placement

## 2022-12-16 ENCOUNTER — Encounter (HOSPITAL_COMMUNITY): Payer: Self-pay

## 2022-12-16 ENCOUNTER — Ambulatory Visit (HOSPITAL_COMMUNITY)
Admission: RE | Admit: 2022-12-16 | Discharge: 2022-12-16 | Disposition: A | Discharge status: Home / Self Care | Payer: BC Managed Care – PPO | Source: Ambulatory Visit | Attending: Hematology and Oncology | Admitting: Hematology and Oncology

## 2022-12-16 ENCOUNTER — Other Ambulatory Visit: Payer: Self-pay

## 2022-12-16 ENCOUNTER — Telehealth: Payer: Self-pay | Admitting: Oncology

## 2022-12-16 DIAGNOSIS — Z95828 Presence of other vascular implants and grafts: Secondary | ICD-10-CM

## 2022-12-16 DIAGNOSIS — C539 Malignant neoplasm of cervix uteri, unspecified: Secondary | ICD-10-CM

## 2022-12-16 HISTORY — DX: Presence of other vascular implants and grafts: Z95.828

## 2022-12-16 HISTORY — PX: IR IMAGING GUIDED PORT INSERTION: IMG5740

## 2022-12-16 MED ORDER — MIDAZOLAM HCL 2 MG/2ML IJ SOLN
INTRAMUSCULAR | Status: AC
  Filled 2022-12-16: qty 4

## 2022-12-16 MED ORDER — MIDAZOLAM HCL 2 MG/2ML IJ SOLN
INTRAMUSCULAR | Status: AC | PRN
  Administered 2022-12-16 (×2): 2 mg via INTRAVENOUS

## 2022-12-16 MED ORDER — MIDAZOLAM HCL 2 MG/2ML IJ SOLN
INTRAMUSCULAR | Status: AC
  Filled 2022-12-16: qty 2

## 2022-12-16 MED ORDER — LIDOCAINE-EPINEPHRINE 1 %-1:100000 IJ SOLN
INTRAMUSCULAR | Status: AC
  Filled 2022-12-16: qty 1

## 2022-12-16 MED ORDER — FENTANYL CITRATE (PF) 100 MCG/2ML IJ SOLN
INTRAMUSCULAR | Status: AC
  Filled 2022-12-16: qty 2

## 2022-12-16 MED ORDER — HEPARIN SOD (PORK) LOCK FLUSH 100 UNIT/ML IV SOLN
INTRAVENOUS | Status: AC
  Filled 2022-12-16: qty 5

## 2022-12-16 MED ORDER — SODIUM CHLORIDE 0.9 % IV SOLN: INTRAVENOUS | Status: DC

## 2022-12-16 MED ORDER — FENTANYL CITRATE (PF) 100 MCG/2ML IJ SOLN
INTRAMUSCULAR | Status: AC | PRN
  Administered 2022-12-16 (×2): 50 ug via INTRAVENOUS

## 2022-12-16 NOTE — Procedures (Signed)
Pre Procedure Dx: Poor venous access Post Procedural Dx: Same  Successful placement of right IJ approach port-a-cath with tip at the superior caval atrial junction. The catheter is ready for immediate use.  Estimated Blood Loss: Trace  Complications: None immediate.  Jay Leshon Armistead, MD Pager #: [phone]   

## 2022-12-16 NOTE — Discharge Instructions (Signed)

## 2022-12-16 NOTE — Progress Notes (Signed)
Gynecologic Oncology Return Clinic Visit  12/17/22  Reason for Visit: biopsy  Treatment History: Oncology History  Cervical cancer (HCC)  11/24/2022 Initial Diagnosis   The patient presented recently to the emergency department with both vaginal bleeding and rectal bleeding.  Exam in the emergency department showed a 6 cm irregular cervical mass.     11/24/2022 Imaging   CT pelvis 1. Heterogeneous cervical mass is highly worrisome for cervical carcinoma. Significant border of contact with the adjacent rectum. Difficult to exclude invasion. 2. Locule of air within the cervical mass may be due to necrosis or extension from the vagina. Difficult to exclude a fistula to the rectum.   11/24/2022 Imaging   US pelvis 1. Large, approximately 7.6 cm vascular soft tissue mass of the lower uterine segment. This seems inseparable from the cervix and Cervical Carcinoma is not excluded. Alternatively this might be a large lower uterine fibroid. Recommend direct visualization of the cervix.   2. Elsewhere the uterus and endometrium are within normal limits. Normal ovaries and no free fluid.   11/26/2022 Pathology Results   SURGICAL PATHOLOGY  CASE: [id number]  PATIENT: Lorraine Yates  Surgical Pathology Report   FINAL MICROSCOPIC DIAGNOSIS:   A. 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  See comment.   COMMENT:   - Morphologic evaluation and p16 immunohistochemical stain reveal superficial squamous mucosa with findings consistent with high-grade squamous intraepithelial lesion.  The patient's clinical history of a cervical mass is noted, and a deeper more invasive process cannot be excluded.  Clinical and imaging correlation is recommended.      11/29/2022 Initial Diagnosis   Cervical cancer (HCC)   12/02/2022 Cancer Staging   Staging form: Cervix Uteri, AJCC Version 9 - Clinical stage from 12/02/2022: Stage IIIC1 (cT1b3, cN1, cM0) - Signed by Artis Delay,  MD on 12/15/2022 Stage prefix: Initial diagnosis   12/15/2022 PET scan   1. Hypermetabolic cervical mass consistent with known cervical carcinoma. 2. There is a mildly FDG avid left pelvic sidewall lymph node which is equivocal for nodal metastasis. 3. No additional sites of disease identified. 4. Aortic Atherosclerosis (ICD10-I70.0).     12/16/2022 Procedure   Successful placement of a right internal jugular approach power injectable Port-A-Cath. The catheter is ready for immediate use.       Interval History: Port placed on 12/16/22.   Overall doing well since her last visit, some continued bleeding, heavier the last couple of days.  Past Medical/Surgical History: Past Medical History:  Diagnosis Date   Anemia     Past Surgical History:  Procedure Laterality Date   IR IMAGING GUIDED PORT INSERTION  12/16/2022    Family History  Problem Relation Age of Onset   Alzheimer's disease Mother    Alzheimer's disease Father    Prostate cancer Father    Pancreatic cancer Neg Hx    Colon cancer Neg Hx    Breast cancer Neg Hx    Endometrial cancer Neg Hx     Social History   Socioeconomic History   Marital status: Married    Spouse name: Not on file   Number of children: 2   Years of education: Not on file   Highest education level: Not on file  Occupational History   Occupation: personal shopper  Tobacco Use   Smoking status: Every Day    Packs/day: .15    Types: Cigarettes   Smokeless tobacco: Never   Tobacco comments:    Socially - 1  pack per week  Vaping Use   Vaping Use: Never used  Substance and Sexual Activity   Alcohol use: Yes    Comment: socially   Drug use: Yes    Types: Marijuana    Comment: THC - once q 2 weeks   Sexual activity: Not Currently  Other Topics Concern   Not on file  Social History Narrative   Not on file   Social Determinants of Health   Financial Resource Strain: Not on file  Food Insecurity: No Food Insecurity (12/15/2022)   Hunger  Vital Sign    Worried About Running Out of Food in the Last Year: Never true    Ran Out of Food in the Last Year: Never true  Transportation Needs: No Transportation Needs (12/15/2022)   PRAPARE - Administrator, Civil Service (Medical): No    Lack of Transportation (Non-Medical): No  Physical Activity: Not on file  Stress: Not on file  Social Connections: Moderately Isolated (11/25/2022)   Social Connection and Isolation Panel [NHANES]    Frequency of Communication with Friends and Family: More than three times a week    Frequency of Social Gatherings with Friends and Family: More than three times a week    Attends Religious Services: Never    Database administrator or Organizations: No    Attends Banker Meetings: Never    Marital Status: Married    Current Medications:  Current Outpatient Medications:    ferrous sulfate 324 MG TBEC, Take 324 mg by mouth 2 (two) times daily., Disp: , Rfl:   Review of Systems: + Weight loss, shortness of breath, vaginal bleeding, hot flashes. Denies appetite changes, fevers, chills, fatigue. Denies hearing loss, neck lumps or masses, mouth sores, ringing in ears or voice changes. Denies cough or wheezing.   Denies chest pain or palpitations. Denies leg swelling. Denies abdominal distention, pain, blood in stools, constipation, diarrhea, nausea, vomiting, or early satiety. Denies pain with intercourse, dysuria, frequency, hematuria or incontinence. Denies pelvic pain or vaginal discharge.   Denies joint pain, back pain or muscle pain/cramps. Denies itching, rash, or wounds. Denies dizziness, headaches, numbness or seizures. Denies swollen lymph nodes or glands, denies easy bruising or bleeding. Denies anxiety, depression, confusion, or decreased concentration.  Physical Exam: BP 109/66 (BP Location: Left Arm, Patient Position: Sitting)   Pulse (!) 114   Temp (!) 97.4 F (36.3 C) (Axillary)   Resp 20   Ht 5' 4.96"  (1.65 m)   Wt 157 lb (71.2 kg)   LMP 11/10/2022 Comment: Period since 11-10-22 ongoing  SpO2 100%   BMI 26.16 kg/m  General: Alert, oriented, no acute distress. Chest: Unlabored breathing on room air. Cardiovascular: Tachycardic, regular rhythm, no murmurs.  GU: Normal appearing external genitalia without erythema, excoriation, or lesions.  Speculum exam reveals cervix measuring 6-8 cm and is completely replaced by tumor.  Minimal blood within the vaginal vault.  Very atypical vascularity noted, minimal bleeding with manipulation of the cervix.   Cervical biopsy procedure Preoperative diagnosis: Suspected cervical cancer Postoperative diagnosis: Same as above Physician: Pricilla Holm MD Estimated blood loss: Minimal Specimens: Cervical biopsy Procedure: After the procedure was discussed with the patient including risks and benefits, she gave consent.  She was then placed in dorsolithotomy position and a speculum was placed in the vagina.  Once the cervix was well visualized it was cleansed with Betadine x3.  Tischler forceps were used to take 4 biopsies from the anterior cervix at  10:00.  These were was placed in formalin.  Silver nitrate and Monsels was used to achieve hemostasis.  Overall the patient tolerated the procedure well.  All instruments were removed from the vagina.  Laboratory & Radiologic Studies: None new  Assessment & Plan: Lorraine Yates is a 52 y.o. woman with Stage IIIC1 cervical cancer with large cervical tumor who presents for additional biopsies. 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   Additional biopsies performed today for PD-L1 testing.    Given tachycardia and shortness of breath with ongoing bleeding, will get a CBC today.  Patient will be getting definitive radiation with sensitizing cisplatin, hopefully with the addition of pembrolizumab.  Addendum: Patient's Hgb returned at 5.5.  Unfortunately, it is too late in the day for her to  receive blood transfusion in the infusion room.  Given her symptoms, patient was called and advised to present to the emergency department for 2 units of packed red blood cells.  18 minutes of total time was spent for this patient encounter, including preparation, face-to-face counseling with the patient and coordination of care, and documentation of the encounter.  Eugene Garnet, MD  Division of Gynecologic Oncology  Department of Obstetrics and Gynecology  [facility]

## 2022-12-16 NOTE — Sedation Documentation (Signed)
Pt complaining of pain right arm where IV is . Site checked swollen. Pt. Had been given 2 of versed and 50 of fentanyl. Dr.Watts aware.

## 2022-12-16 NOTE — Telephone Encounter (Signed)
Called Lorraine Yates and scheduled a follow up with Dr. Pricilla Holm on 12/17/22 at 3:15.  She verbalized agreement and asked if we have received her FMLA paperwork.  She said it is due by 12/20/22.  Advised her that I will check on it and call her back.

## 2022-12-17 ENCOUNTER — Other Ambulatory Visit: Payer: Self-pay

## 2022-12-17 ENCOUNTER — Ambulatory Visit (HOSPITAL_COMMUNITY): Admission: RE | Admit: 2022-12-17 | Payer: BC Managed Care – PPO | Source: Ambulatory Visit

## 2022-12-17 ENCOUNTER — Emergency Department (HOSPITAL_COMMUNITY)
Admission: EM | Admit: 2022-12-17 | Discharge: 2022-12-18 | Disposition: A | Discharge status: Home / Self Care | Payer: BC Managed Care – PPO | Attending: Emergency Medicine | Admitting: Emergency Medicine

## 2022-12-17 ENCOUNTER — Telehealth: Payer: Self-pay | Admitting: Medical Oncology

## 2022-12-17 ENCOUNTER — Encounter: Payer: Self-pay | Admitting: Gynecologic Oncology

## 2022-12-17 ENCOUNTER — Telehealth: Payer: Self-pay | Admitting: Oncology

## 2022-12-17 ENCOUNTER — Telehealth: Payer: Self-pay | Admitting: Surgery

## 2022-12-17 ENCOUNTER — Inpatient Hospital Stay: Payer: BC Managed Care – PPO | Attending: Gynecologic Oncology | Admitting: Gynecologic Oncology

## 2022-12-17 ENCOUNTER — Inpatient Hospital Stay: Payer: BC Managed Care – PPO

## 2022-12-17 VITALS — BP 109/66 | HR 114 | Temp 97.4°F | Resp 20 | Ht 64.96 in | Wt 157.0 lb

## 2022-12-17 DIAGNOSIS — R0602 Shortness of breath: Secondary | ICD-10-CM | POA: Insufficient documentation

## 2022-12-17 DIAGNOSIS — N888 Other specified noninflammatory disorders of cervix uteri: Secondary | ICD-10-CM

## 2022-12-17 DIAGNOSIS — Z8042 Family history of malignant neoplasm of prostate: Secondary | ICD-10-CM | POA: Insufficient documentation

## 2022-12-17 DIAGNOSIS — C539 Malignant neoplasm of cervix uteri, unspecified: Secondary | ICD-10-CM | POA: Diagnosis not present

## 2022-12-17 DIAGNOSIS — R000 Tachycardia, unspecified: Secondary | ICD-10-CM

## 2022-12-17 DIAGNOSIS — N938 Other specified abnormal uterine and vaginal bleeding: Secondary | ICD-10-CM | POA: Insufficient documentation

## 2022-12-17 DIAGNOSIS — D500 Iron deficiency anemia secondary to blood loss (chronic): Secondary | ICD-10-CM | POA: Insufficient documentation

## 2022-12-17 DIAGNOSIS — C531 Malignant neoplasm of exocervix: Secondary | ICD-10-CM | POA: Diagnosis not present

## 2022-12-17 DIAGNOSIS — D649 Anemia, unspecified: Secondary | ICD-10-CM | POA: Diagnosis present

## 2022-12-17 DIAGNOSIS — F17210 Nicotine dependence, cigarettes, uncomplicated: Secondary | ICD-10-CM | POA: Insufficient documentation

## 2022-12-17 LAB — CBC WITH DIFFERENTIAL/PLATELET
Abs Immature Granulocytes: 0.02 10*3/uL (ref 0.00–0.07)
Basophils Absolute: 0 10*3/uL (ref 0.0–0.1)
Basophils Relative: 0 %
Eosinophils Absolute: 0.2 10*3/uL (ref 0.0–0.5)
Eosinophils Relative: 2 %
HCT: 19.1 % — ABNORMAL LOW (ref 36.0–46.0)
Hemoglobin: 5.5 g/dL — CL (ref 12.0–15.0)
Immature Granulocytes: 0 %
Lymphocytes Relative: 16 %
Lymphs Abs: 1.5 10*3/uL (ref 0.7–4.0)
MCH: 26.3 pg (ref 26.0–34.0)
MCHC: 28.8 g/dL — ABNORMAL LOW (ref 30.0–36.0)
MCV: 91.4 fL (ref 80.0–100.0)
Monocytes Absolute: 0.7 10*3/uL (ref 0.1–1.0)
Monocytes Relative: 7 %
Neutro Abs: 7 10*3/uL (ref 1.7–7.7)
Neutrophils Relative %: 75 %
Platelets: 540 10*3/uL — ABNORMAL HIGH (ref 150–400)
RBC: 2.09 MIL/uL — ABNORMAL LOW (ref 3.87–5.11)
RDW: 23.4 % — ABNORMAL HIGH (ref 11.5–15.5)
WBC: 9.4 10*3/uL (ref 4.0–10.5)
nRBC: 0 % (ref 0.0–0.2)

## 2022-12-17 LAB — COMPREHENSIVE METABOLIC PANEL
ALT: 9 U/L (ref 0–44)
AST: 13 U/L — ABNORMAL LOW (ref 15–41)
Albumin: 3.3 g/dL — ABNORMAL LOW (ref 3.5–5.0)
Alkaline Phosphatase: 58 U/L (ref 38–126)
Anion gap: 9 (ref 5–15)
BUN: 11 mg/dL (ref 6–20)
CO2: 24 mmol/L (ref 22–32)
Calcium: 8.7 mg/dL — ABNORMAL LOW (ref 8.9–10.3)
Chloride: 102 mmol/L (ref 98–111)
Creatinine, Ser: 0.72 mg/dL (ref 0.44–1.00)
GFR, Estimated: >60 mL/min (ref >60)
Glucose, Bld: 105 mg/dL — ABNORMAL HIGH (ref 70–99)
Potassium: 3.4 mmol/L — ABNORMAL LOW (ref 3.5–5.1)
Sodium: 135 mmol/L (ref 135–145)
Total Bilirubin: 0.2 mg/dL — ABNORMAL LOW (ref 0.3–1.2)
Total Protein: 6.9 g/dL (ref 6.5–8.1)

## 2022-12-17 LAB — PREPARE RBC (CROSSMATCH)

## 2022-12-17 LAB — TYPE AND SCREEN: Antibody Screen: NEGATIVE

## 2022-12-17 LAB — BPAM RBC: Blood Product Expiration Date: 202406252359

## 2022-12-17 LAB — CBC (CANCER CENTER ONLY)
HCT: 18.7 % — ABNORMAL LOW (ref 36.0–46.0)
Hemoglobin: 5.5 g/dL — CL (ref 12.0–15.0)
MCH: 26.6 pg (ref 26.0–34.0)
MCHC: 29.4 g/dL — ABNORMAL LOW (ref 30.0–36.0)
MCV: 90.3 fL (ref 80.0–100.0)
Platelet Count: 550 10*3/uL — ABNORMAL HIGH (ref 150–400)
RBC: 2.07 MIL/uL — ABNORMAL LOW (ref 3.87–5.11)
RDW: 23.8 % — ABNORMAL HIGH (ref 11.5–15.5)
WBC Count: 8.7 10*3/uL (ref 4.0–10.5)
nRBC: 0 % (ref 0.0–0.2)

## 2022-12-17 MED ORDER — HYDROCODONE-ACETAMINOPHEN 5-325 MG PO TABS
1.0000 | ORAL_TABLET | Freq: Once | ORAL | Status: AC
  Administered 2022-12-17: 1 via ORAL
  Filled 2022-12-17: qty 1

## 2022-12-17 MED ORDER — SODIUM CHLORIDE 0.9% IV SOLUTION: Freq: Once | INTRAVENOUS | Status: AC

## 2022-12-17 NOTE — Telephone Encounter (Signed)
CRITICAL VALUE STICKER  CRITICAL VALUE: Hgb 5.5  RECEIVER (on-site recipient of call):Seville Downs, RN  DATE & TIME NOTIFIED: 12/17/2022 @1610   MESSENGER (representative from lab):Lab   MD NOTIFIED: Leta Baptist , RN for Dr. Pricilla Holm  TIME OF NOTIFICATION:1611  RESPONSE:  Shanda Bumps will send pt to ED.

## 2022-12-17 NOTE — ED Provider Notes (Signed)
[place] EMERGENCY DEPARTMENT AT [facility] Provider Note   CSN: [id number] Arrival date & time: 12/17/22  1630     History {Add pertinent medical, surgical, social history, OB history to HPI:1} Chief Complaint  Patient presents with   Low hgb    Lorraine Yates is a 52 y.o. female.  HPI   Patient has a history of cervical mass concerning for cervical carcinoma.  Patient has been having issues with vaginal bleeding associated with this.  Patient denies feeling lightheaded or dizzy.  She has not had any syncopal episodes.  She was evaluated by Dr. Pricilla Holm in the gynecology oncology office today.  Patient had laboratory tests today.  Home Medications Prior to Admission medications   Medication Sig Start Date End Date Taking? Authorizing Provider  ferrous sulfate 324 MG TBEC Take 324 mg by mouth 2 (two) times daily.    [provider]      Allergies    Patient has no known allergies.    Review of Systems   Review of Systems  Physical Exam Updated Vital Signs BP 108/64   Pulse (!) 107   Temp 100.3 F (37.9 C)   Resp 20   LMP 11/10/2022 Comment: Period since 11-10-22 ongoing  SpO2 100%  Physical Exam Vitals and nursing note reviewed.  Constitutional:      General: She is not in acute distress.    Appearance: She is well-developed.  HENT:     Head: Normocephalic and atraumatic.     Right Ear: External ear normal.     Left Ear: External ear normal.  Eyes:     General: No scleral icterus.       Right eye: No discharge.        Left eye: No discharge.     Conjunctiva/sclera: Conjunctivae normal.  Neck:     Trachea: No tracheal deviation.  Cardiovascular:     Rate and Rhythm: Normal rate.  Pulmonary:     Effort: Pulmonary effort is normal. No respiratory distress.     Breath sounds: No stridor.  Abdominal:     General: Bowel sounds are normal. There is no distension.     Palpations: Abdomen is soft.     Tenderness: There is no abdominal  tenderness.  Musculoskeletal:        General: No swelling or deformity.     Cervical back: Neck supple.  Skin:    General: Skin is warm and dry.     Findings: No rash.  Neurological:     Mental Status: She is alert. Mental status is at baseline.     Cranial Nerves: No dysarthria or facial asymmetry.     Motor: No seizure activity.     ED Results / Procedures / Treatments   Labs (all labs ordered are listed, but only abnormal results are displayed) Labs Reviewed  CBC WITH DIFFERENTIAL/PLATELET  COMPREHENSIVE METABOLIC PANEL  TYPE AND SCREEN    EKG None  Radiology IR IMAGING GUIDED PORT INSERTION  Result Date: 12/16/2022 INDICATION: Cervical cancer. In need of durable intravenous access for chemotherapy administration. EXAM: IMPLANTED PORT A CATH PLACEMENT WITH ULTRASOUND AND FLUOROSCOPIC GUIDANCE COMPARISON:  PET-CT-12/14/2022 MEDICATIONS: None ANESTHESIA/SEDATION: Moderate (conscious) sedation was employed during this procedure as administered by the Interventional Radiology RN. A total of Versed 4 mg and Fentanyl 100 mcg was administered intravenously. Moderate Sedation Time: 31 minutes. The patient's level of consciousness and vital signs were monitored continuously by radiology nursing throughout the procedure under my  direct supervision. CONTRAST:  None FLUOROSCOPY TIME:  24 seconds (1 mGy) COMPLICATIONS: None immediate. PROCEDURE: The procedure, risks, benefits, and alternatives were explained to the patient. Questions regarding the procedure were encouraged and answered. The patient understands and consents to the procedure. The right neck and chest were prepped with chlorhexidine in a sterile fashion, and a sterile drape was applied covering the operative field. Maximum barrier sterile technique with sterile gowns and gloves were used for the procedure. A timeout was performed prior to the initiation of the procedure. Local anesthesia was provided with 1% lidocaine with  epinephrine. After creating a small venotomy incision, a micropuncture kit was utilized to access the internal jugular vein. Real-time ultrasound guidance was utilized for vascular access including the acquisition of a permanent ultrasound image documenting patency of the accessed vessel. The microwire was utilized to measure appropriate catheter length. A subcutaneous port pocket was then created along the upper chest wall utilizing a combination of sharp and blunt dissection. The pocket was irrigated with sterile saline. A single lumen clear view power injectable port was chosen for placement. The 8 Fr catheter was tunneled from the port pocket site to the venotomy incision. The port was placed in the pocket. The external catheter was trimmed to appropriate length. At the venotomy, an 8 Fr peel-away sheath was placed over a guidewire under fluoroscopic guidance. The catheter was then placed through the sheath and the sheath was removed. Final catheter positioning was confirmed and documented with a fluoroscopic spot radiograph. The port was accessed with a Huber needle, aspirated and flushed with heparinized saline. The venotomy site was closed with an interrupted 4-0 Vicryl suture. The port pocket incision was closed with interrupted 2-0 Vicryl suture. The skin was opposed with a running subcuticular 4-0 Vicryl suture. Dermabond and Steri-strips were applied to both incisions. Dressings were applied. The patient tolerated the procedure well without immediate post procedural complication. FINDINGS: After catheter placement, the tip lies within the superior cavoatrial junction. The catheter aspirates and flushes normally and is ready for immediate use. IMPRESSION: Successful placement of a right internal jugular approach power injectable Port-A-Cath. The catheter is ready for immediate use. Electronically Signed   By: Simonne Come M.D.   On: 12/16/2022 13:04    Procedures Procedures  {Document cardiac monitor,  telemetry assessment procedure when appropriate:1}  Medications Ordered in ED Medications - No data to display  ED Course/ Medical Decision Making/ A&P   {   Click here for ABCD2, HEART and other calculatorsREFRESH Note before signing :1}                          Medical Decision Making Amount and/or Complexity of Data Reviewed Labs: ordered. Discussion of management or test interpretation with external provider(s): Case reviewed with Dr. Pricilla Holm.  She did a pelvic exam today.  Patient does not have a lot of bleeding at this time.  She does feel patient is appropriate for discharge after 2 units of blood.  Patient was also post to have an outpatient MRI today.  Unfortunately they are not able to do her outpatient study while she is in the emergency room receiving a blood transfusion.  I will order the MRI that was planned for this evening in the ED so we can continue to expedite her outpatient management  Risk Prescription drug management.   ***  {Document critical care time when appropriate:1} {Document review of labs and clinical decision tools ie heart  score, Chads2Vasc2 etc:1}  {Document your independent review of radiology images, and any outside records:1} {Document your discussion with family members, caretakers, and with consultants:1} {Document social determinants of health affecting pt's care:1} {Document your decision making why or why not admission, treatments were needed:1} Final Clinical Impression(s) / ED Diagnoses Final diagnoses:  None    Rx / DC Orders ED Discharge Orders     None

## 2022-12-17 NOTE — ED Notes (Signed)
Critical Hgb 5.5. ED MD aware. Pending transfusion.

## 2022-12-17 NOTE — Patient Instructions (Signed)
We will contact you with the results of your biopsies from today.  You may have some spotting or light brown drainage from the biopsies today and from the medication used today to stop bleeding.  We will also contact you with the results of your lab from today.

## 2022-12-17 NOTE — ED Triage Notes (Signed)
Pt arrived via POV. Pt was referred by MD for Hgb of 5.5. pt has no other complaints.  Pt has cervical cx and is scheduled to have MRI at 2100 today.

## 2022-12-17 NOTE — Telephone Encounter (Signed)
Received call from lab with critical Hgb level of 5.5. Dr Pricilla Holm notified. Dr Pricilla Holm advised pt go to the ED for a blood transfusion. Patient was called and verbalized understanding, stating that she would go to the ED. ED charge nurse called and given report.

## 2022-12-17 NOTE — Telephone Encounter (Signed)
Called Lorraine Yates with appointment for the MRI tonight at 9:00 pm, arrival at 8:30.  Gave her instructions to check in through the ER.  She verbalized understanding and agreement.

## 2022-12-18 ENCOUNTER — Emergency Department (HOSPITAL_COMMUNITY)
Admission: RE | Admit: 2022-12-18 | Discharge: 2022-12-18 | Disposition: A | Discharge status: Home / Self Care | Payer: BC Managed Care – PPO | Source: Ambulatory Visit | Attending: Radiology | Admitting: Radiology

## 2022-12-18 DIAGNOSIS — C531 Malignant neoplasm of exocervix: Secondary | ICD-10-CM

## 2022-12-18 LAB — BPAM RBC
Blood Product Expiration Date: 202406252359
ISSUE DATE / TIME: 202406072014

## 2022-12-18 LAB — TYPE AND SCREEN

## 2022-12-18 MED ORDER — GADOBUTROL 1 MMOL/ML IV SOLN
7.0000 mL | Freq: Once | INTRAVENOUS | Status: AC | PRN
  Administered 2022-12-18: 7 mL via INTRAVENOUS

## 2022-12-18 NOTE — Discharge Instructions (Addendum)
Return tomorrow to have your MRI done at 5pm.  Follow up with Dr Pricilla Holm as planned

## 2022-12-18 NOTE — ED Notes (Signed)
Dc post blood transfusion   Nira Conn, MD 12/18/22 [phone]

## 2022-12-20 ENCOUNTER — Encounter: Payer: Self-pay | Admitting: Hematology and Oncology

## 2022-12-20 ENCOUNTER — Inpatient Hospital Stay (HOSPITAL_BASED_OUTPATIENT_CLINIC_OR_DEPARTMENT_OTHER): Payer: BC Managed Care – PPO | Admitting: Hematology and Oncology

## 2022-12-20 ENCOUNTER — Other Ambulatory Visit: Payer: Self-pay | Admitting: Oncology

## 2022-12-20 ENCOUNTER — Other Ambulatory Visit: Payer: Self-pay | Admitting: Hematology and Oncology

## 2022-12-20 ENCOUNTER — Telehealth: Payer: Self-pay | Admitting: Oncology

## 2022-12-20 ENCOUNTER — Inpatient Hospital Stay: Payer: BC Managed Care – PPO

## 2022-12-20 VITALS — BP 122/70 | HR 89 | Temp 97.8°F | Resp 18 | Ht 64.0 in | Wt 158.8 lb

## 2022-12-20 DIAGNOSIS — D508 Other iron deficiency anemias: Secondary | ICD-10-CM

## 2022-12-20 DIAGNOSIS — C531 Malignant neoplasm of exocervix: Secondary | ICD-10-CM | POA: Diagnosis not present

## 2022-12-20 DIAGNOSIS — R0602 Shortness of breath: Secondary | ICD-10-CM | POA: Diagnosis not present

## 2022-12-20 DIAGNOSIS — Z8042 Family history of malignant neoplasm of prostate: Secondary | ICD-10-CM | POA: Diagnosis not present

## 2022-12-20 DIAGNOSIS — R000 Tachycardia, unspecified: Secondary | ICD-10-CM | POA: Diagnosis not present

## 2022-12-20 DIAGNOSIS — C539 Malignant neoplasm of cervix uteri, unspecified: Secondary | ICD-10-CM | POA: Diagnosis not present

## 2022-12-20 DIAGNOSIS — D500 Iron deficiency anemia secondary to blood loss (chronic): Secondary | ICD-10-CM | POA: Diagnosis not present

## 2022-12-20 DIAGNOSIS — F17210 Nicotine dependence, cigarettes, uncomplicated: Secondary | ICD-10-CM | POA: Diagnosis not present

## 2022-12-20 DIAGNOSIS — N938 Other specified abnormal uterine and vaginal bleeding: Secondary | ICD-10-CM | POA: Diagnosis not present

## 2022-12-20 LAB — CBC WITH DIFFERENTIAL/PLATELET
Abs Immature Granulocytes: 0.01 10*3/uL (ref 0.00–0.07)
Basophils Absolute: 0 10*3/uL (ref 0.0–0.1)
Basophils Relative: 1 %
Eosinophils Absolute: 0.3 10*3/uL (ref 0.0–0.5)
Eosinophils Relative: 4 %
HCT: 23.3 % — ABNORMAL LOW (ref 36.0–46.0)
Hemoglobin: 7.4 g/dL — ABNORMAL LOW (ref 12.0–15.0)
Immature Granulocytes: 0 %
Lymphocytes Relative: 24 %
Lymphs Abs: 1.9 10*3/uL (ref 0.7–4.0)
MCH: 28 pg (ref 26.0–34.0)
MCHC: 31.8 g/dL (ref 30.0–36.0)
MCV: 88.3 fL (ref 80.0–100.0)
Monocytes Absolute: 0.6 10*3/uL (ref 0.1–1.0)
Monocytes Relative: 8 %
Neutro Abs: 5 10*3/uL (ref 1.7–7.7)
Neutrophils Relative %: 63 %
Platelets: 487 10*3/uL — ABNORMAL HIGH (ref 150–400)
RBC: 2.64 MIL/uL — ABNORMAL LOW (ref 3.87–5.11)
RDW: 21.5 % — ABNORMAL HIGH (ref 11.5–15.5)
WBC: 7.9 10*3/uL (ref 4.0–10.5)
nRBC: 0 % (ref 0.0–0.2)

## 2022-12-20 LAB — TYPE AND SCREEN
ABO/RH(D): A NEG
Unit division: 0
Unit division: 0

## 2022-12-20 LAB — SAMPLE TO BLOOD BANK

## 2022-12-20 LAB — BPAM RBC
Blood Product Expiration Date: 202406252359
Unit Type and Rh: 600

## 2022-12-20 NOTE — Progress Notes (Addendum)
Gynecologic Oncology Multi-Disciplinary Disposition Conference Note  Date of the Conference: 12/20/2022  Patient Name: Lorraine Yates  Referring Provider: Dr. Debroah Loop Primary GYN Oncologist: Dr. Pricilla Holm   Stage/Disposition:  Presumed stage IIB cervical cancer. Disposition is for concurrent chemotherapy and radiation plus pembrolizumab.   This Multidisciplinary conference took place involving physicians from Gynecologic Oncology, Medical Oncology, Radiation Oncology, Pathology, Radiology along with the Gynecologic Oncology Nurse Practitioner and Gynecologic Oncology Nurse Navigator.  Comprehensive assessment of the patient's malignancy, staging, need for surgery, chemotherapy, radiation therapy, and need for further testing were reviewed. Supportive measures, both inpatient and following discharge were also discussed. The recommended plan of care is documented. Greater than 35 minutes were spent correlating and coordinating this patient's care.

## 2022-12-20 NOTE — Assessment & Plan Note (Signed)
She had recent severe anemia to the point she needed blood transfusion last week Repeat blood count is better She does not need repeat blood transfusion this week The most likely cause of her anemia is due to chronic blood loss/malabsorption syndrome. We discussed some of the risks, benefits, and alternatives of intravenous iron infusions. The patient is symptomatic from anemia and the iron level is critically low. She tolerated oral iron supplement poorly and desires to achieved higher levels of iron faster for adequate hematopoesis. Some of the side-effects to be expected including risks of infusion reactions, phlebitis, headaches, nausea and fatigue.  The patient is willing to proceed. Patient education material was dispensed.  Goal is to keep ferritin level greater than 50 and resolution of anemia She agreed to proceed She is scheduled for IV iron this week I recommend 2 more doses of intravenous iron within the next 2 weeks I plan to repeat her blood work after all 3 doses of IV iron and before she starts her chemotherapy

## 2022-12-20 NOTE — Assessment & Plan Note (Signed)
Repeat biopsy was taken last week.  Molecular testing will be added.  As soon as we know, we can schedule her treatment to start I discussed with the patient the rationale of adding pembrolizumab if her test came back positive  In the meantime, we will continue supportive care with IV iron infusion I will call the patient with test results

## 2022-12-20 NOTE — Progress Notes (Signed)
[facility] OFFICE PROGRESS NOTE  Patient Care Team: Patient, No Pcp Per as PCP - General (General Practice)  ASSESSMENT & PLAN:  Cervical cancer (HCC) Repeat biopsy was taken last week.  Molecular testing will be added.  As soon as we know, we can schedule her treatment to start I discussed with the patient the rationale of adding pembrolizumab if her test came back positive  In the meantime, we will continue supportive care with IV iron infusion I will call the patient with test results  Iron deficiency anemia She had recent severe anemia to the point she needed blood transfusion last week Repeat blood count is better She does not need repeat blood transfusion this week The most likely cause of her anemia is due to chronic blood loss/malabsorption syndrome. We discussed some of the risks, benefits, and alternatives of intravenous iron infusions. The patient is symptomatic from anemia and the iron level is critically low. She tolerated oral iron supplement poorly and desires to achieved higher levels of iron faster for adequate hematopoesis. Some of the side-effects to be expected including risks of infusion reactions, phlebitis, headaches, nausea and fatigue.  The patient is willing to proceed. Patient education material was dispensed.  Goal is to keep ferritin level greater than 50 and resolution of anemia She agreed to proceed She is scheduled for IV iron this week I recommend 2 more doses of intravenous iron within the next 2 weeks I plan to repeat her blood work after all 3 doses of IV iron and before she starts her chemotherapy  No orders of the defined types were placed in this encounter.   All questions were answered. The patient knows to call the clinic with any problems, questions or concerns. The total time spent in the appointment was 30 minutes encounter with patients including review of chart and various tests results, discussions about plan of care and  coordination of care plan   Artis Delay, MD 12/20/2022 2:35 PM  INTERVAL HISTORY: Please see below for problem oriented charting. she returns for treatment follow-up She is seen urgently today due to recent blood transfusion requirement She was seen by GYN surgeon last Friday for repeat cervical biopsy Blood test was done and it show she had significant anemia.  Due to the time of the day, she was routed to the emergency department to complete 2 units of blood transfusion She felt better after blood transfusion today Denies chest pain, dizziness or shortness of breath I reviewed repeat blood work with the patient and discussed treatment plan Her case was also at the GYN tumor board this morning  REVIEW OF SYSTEMS:   Constitutional: Denies fevers, chills or abnormal weight loss Eyes: Denies blurriness of vision Ears, nose, mouth, throat, and face: Denies mucositis or sore throat Respiratory: Denies cough, dyspnea or wheezes Cardiovascular: Denies palpitation, chest discomfort or lower extremity swelling Gastrointestinal:  Denies nausea, heartburn or change in bowel habits Skin: Denies abnormal skin rashes Lymphatics: Denies new lymphadenopathy or easy bruising Neurological:Denies numbness, tingling or new weaknesses Behavioral/Psych: Mood is stable, no new changes  All other systems were reviewed with the patient and are negative.  I have reviewed the past medical history, past surgical history, social history and family history with the patient and they are unchanged from previous note.  ALLERGIES:  has No Known Allergies.  MEDICATIONS:  Current Outpatient Medications  Medication Sig Dispense Refill   ferrous sulfate 324 MG TBEC Take 324 mg by mouth 2 (two) times  daily.     No current facility-administered medications for this visit.    SUMMARY OF ONCOLOGIC HISTORY: Oncology History  Cervical cancer (HCC)  11/24/2022 Initial Diagnosis   The patient presented recently to the  emergency department with both vaginal bleeding and rectal bleeding.  Exam in the emergency department showed a 6 cm irregular cervical mass.     11/24/2022 Imaging   CT pelvis 1. Heterogeneous cervical mass is highly worrisome for cervical carcinoma. Significant border of contact with the adjacent rectum. Difficult to exclude invasion. 2. Locule of air within the cervical mass may be due to necrosis or extension from the vagina. Difficult to exclude a fistula to the rectum.   11/24/2022 Imaging   US pelvis 1. Large, approximately 7.6 cm vascular soft tissue mass of the lower uterine segment. This seems inseparable from the cervix and Cervical Carcinoma is not excluded. Alternatively this might be a large lower uterine fibroid. Recommend direct visualization of the cervix.   2. Elsewhere the uterus and endometrium are within normal limits. Normal ovaries and no free fluid.   11/26/2022 Pathology Results   SURGICAL PATHOLOGY  CASE: [id number]  PATIENT: Lorraine Yates  Surgical Pathology Report   FINAL MICROSCOPIC DIAGNOSIS:   A. 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  See comment.   COMMENT:   - Morphologic evaluation and p16 immunohistochemical stain reveal superficial squamous mucosa with findings consistent with high-grade squamous intraepithelial lesion.  The patient's clinical history of a cervical mass is noted, and a deeper more invasive process cannot be excluded.  Clinical and imaging correlation is recommended.      11/29/2022 Initial Diagnosis   Cervical cancer (HCC)   12/02/2022 Cancer Staging   Staging form: Cervix Uteri, AJCC Version 9 - Clinical stage from 12/02/2022: Stage IIIC1 (cT1b3, cN1, cM0) - Signed by Artis Delay, MD on 12/15/2022 Stage prefix: Initial diagnosis   12/15/2022 PET scan   1. Hypermetabolic cervical mass consistent with known cervical carcinoma. 2. There is a mildly FDG avid left pelvic sidewall lymph node which  is equivocal for nodal metastasis. 3. No additional sites of disease identified. 4. Aortic Atherosclerosis (ICD10-I70.0).     12/16/2022 Procedure   Successful placement of a right internal jugular approach power injectable Port-A-Cath. The catheter is ready for immediate use.     12/18/2022 Imaging   1. The normal cervical anatomy is completely effaced by a bulky mass measuring 7.9 x 6.9 x 5.5 cm. Mass extends into the lower uterine segment with loss of parametrial definition about the posterior 180 degrees of the upper portion of the mass. Mass remains confined to the upper third of the vagina. 2. Preserved fat planes to the adjacent bladder and rectum. No ureteral invasion or hydronephrosis. 3. Small left pelvic sidewall lymph node measuring 0.9 x 0.6 cm, previously with equivocal FDG avidity and suspicious for a small nodal metastasis. No other enlarged lymph nodes in the pelvis.       PHYSICAL EXAMINATION: ECOG PERFORMANCE STATUS: 1 - Symptomatic but completely ambulatory  Vitals:   12/20/22 1356  BP: 122/70  Pulse: 89  Resp: 18  Temp: 97.8 F (36.6 C)  SpO2: 100%   Filed Weights   12/20/22 1356  Weight: 158 lb 12.8 oz (72 kg)    GENERAL:alert, no distress and comfortable NEURO: alert & oriented x 3 with fluent speech, no focal motor/sensory deficits  LABORATORY DATA:  I have reviewed the data as listed    Component  Value Date/Time   NA 135 12/17/2022 1827   K 3.4 (L) 12/17/2022 1827   CL 102 12/17/2022 1827   CO2 24 12/17/2022 1827   GLUCOSE 105 (H) 12/17/2022 1827   BUN 11 12/17/2022 1827   CREATININE 0.72 12/17/2022 1827   CALCIUM 8.7 (L) 12/17/2022 1827   PROT 6.9 12/17/2022 1827   ALBUMIN 3.3 (L) 12/17/2022 1827   AST 13 (L) 12/17/2022 1827   ALT 9 12/17/2022 1827   ALKPHOS 58 12/17/2022 1827   BILITOT 0.2 (L) 12/17/2022 1827   GFRNONAA >60 12/17/2022 1827    No results found for: "SPEP", "UPEP"  Lab Results  Component Value Date   WBC 7.9  12/20/2022   NEUTROABS 5.0 12/20/2022   HGB 7.4 (L) 12/20/2022   HCT 23.3 (L) 12/20/2022   MCV 88.3 12/20/2022   PLT 487 (H) 12/20/2022      Chemistry      Component Value Date/Time   NA 135 12/17/2022 1827   K 3.4 (L) 12/17/2022 1827   CL 102 12/17/2022 1827   CO2 24 12/17/2022 1827   BUN 11 12/17/2022 1827   CREATININE 0.72 12/17/2022 1827      Component Value Date/Time   CALCIUM 8.7 (L) 12/17/2022 1827   ALKPHOS 58 12/17/2022 1827   AST 13 (L) 12/17/2022 1827   ALT 9 12/17/2022 1827   BILITOT 0.2 (L) 12/17/2022 1827       RADIOGRAPHIC STUDIES: I have personally reviewed the radiological images as listed and agreed with the findings in the report. MR Pelvis W Wo Contrast  Result Date: 12/18/2022 CLINICAL DATA:  Cervical cancer staging, pelvic mass identified by CT EXAM: MRI PELVIS WITHOUT AND WITH CONTRAST TECHNIQUE: Multiplanar multisequence MR imaging of the pelvis was performed both before and after administration of intravenous contrast. CONTRAST:  7mL GADAVIST GADOBUTROL 1 MMOL/ML IV SOLN COMPARISON:  CT pelvis, 11/24/2022, PET-CT, 12/14/2022 FINDINGS: Urinary Tract:  No abnormality visualized. Bowel:  Unremarkable visualized pelvic bowel loops. Vascular/Lymphatic: Small left pelvic sidewall lymph node measuring 0.9 x 0.6 cm, previously with equivocal FDG avidity (series 11, image 18) no other enlarged lymph nodes. No significant vascular abnormality seen. Reproductive: The normal cervical anatomy is completely effaced by a bulky mass measuring 7.9 x 6.9 x 5.5 cm (series 11, image 28, series 3, image 22). Mass extends into the lower uterine segment (series 3, image 24) with loss of parametrial definition about the posterior 180 degrees of the upper portion of the mass (series 11, image 22). Mass remains confined to the upper third of the vagina (series 3, image 20). Preserved fat planes to the adjacent rectum and bladder. Normal ovaries. Other:  None. Musculoskeletal: No  suspicious bone lesions identified. IMPRESSION: 1. The normal cervical anatomy is completely effaced by a bulky mass measuring 7.9 x 6.9 x 5.5 cm. Mass extends into the lower uterine segment with loss of parametrial definition about the posterior 180 degrees of the upper portion of the mass. Mass remains confined to the upper third of the vagina. 2. Preserved fat planes to the adjacent bladder and rectum. No ureteral invasion or hydronephrosis. 3. Small left pelvic sidewall lymph node measuring 0.9 x 0.6 cm, previously with equivocal FDG avidity and suspicious for a small nodal metastasis. No other enlarged lymph nodes in the pelvis. Electronically Signed   By: Jearld Lesch M.D.   On: 12/18/2022 18:27   IR IMAGING GUIDED PORT INSERTION  Result Date: 12/16/2022 INDICATION: Cervical cancer. In need of durable intravenous access  for chemotherapy administration. EXAM: IMPLANTED PORT A CATH PLACEMENT WITH ULTRASOUND AND FLUOROSCOPIC GUIDANCE COMPARISON:  PET-CT-12/14/2022 MEDICATIONS: None ANESTHESIA/SEDATION: Moderate (conscious) sedation was employed during this procedure as administered by the Interventional Radiology RN. A total of Versed 4 mg and Fentanyl 100 mcg was administered intravenously. Moderate Sedation Time: 31 minutes. The patient's level of consciousness and vital signs were monitored continuously by radiology nursing throughout the procedure under my direct supervision. CONTRAST:  None FLUOROSCOPY TIME:  24 seconds (1 mGy) COMPLICATIONS: None immediate. PROCEDURE: The procedure, risks, benefits, and alternatives were explained to the patient. Questions regarding the procedure were encouraged and answered. The patient understands and consents to the procedure. The right neck and chest were prepped with chlorhexidine in a sterile fashion, and a sterile drape was applied covering the operative field. Maximum barrier sterile technique with sterile gowns and gloves were used for the procedure. A timeout  was performed prior to the initiation of the procedure. Local anesthesia was provided with 1% lidocaine with epinephrine. After creating a small venotomy incision, a micropuncture kit was utilized to access the internal jugular vein. Real-time ultrasound guidance was utilized for vascular access including the acquisition of a permanent ultrasound image documenting patency of the accessed vessel. The microwire was utilized to measure appropriate catheter length. A subcutaneous port pocket was then created along the upper chest wall utilizing a combination of sharp and blunt dissection. The pocket was irrigated with sterile saline. A single lumen clear view power injectable port was chosen for placement. The 8 Fr catheter was tunneled from the port pocket site to the venotomy incision. The port was placed in the pocket. The external catheter was trimmed to appropriate length. At the venotomy, an 8 Fr peel-away sheath was placed over a guidewire under fluoroscopic guidance. The catheter was then placed through the sheath and the sheath was removed. Final catheter positioning was confirmed and documented with a fluoroscopic spot radiograph. The port was accessed with a Huber needle, aspirated and flushed with heparinized saline. The venotomy site was closed with an interrupted 4-0 Vicryl suture. The port pocket incision was closed with interrupted 2-0 Vicryl suture. The skin was opposed with a running subcuticular 4-0 Vicryl suture. Dermabond and Steri-strips were applied to both incisions. Dressings were applied. The patient tolerated the procedure well without immediate post procedural complication. FINDINGS: After catheter placement, the tip lies within the superior cavoatrial junction. The catheter aspirates and flushes normally and is ready for immediate use. IMPRESSION: Successful placement of a right internal jugular approach power injectable Port-A-Cath. The catheter is ready for immediate use. Electronically  Signed   By: Simonne Come M.D.   On: 12/16/2022 13:04   NM PET Image Initial (PI) Skull Base To Thigh (F-18 FDG)  Result Date: 12/15/2022 CLINICAL DATA:  Initial treatment strategy for cervical cancer. EXAM: NUCLEAR MEDICINE PET SKULL BASE TO THIGH TECHNIQUE: 7.99 mCi F-18 FDG was injected intravenously. Full-ring PET imaging was performed from the skull base to thigh after the radiotracer. CT data was obtained and used for attenuation correction and anatomic localization. Fasting blood glucose: 81 mg/dl COMPARISON:  CT pelvis 11/24/2022 FINDINGS: Mediastinal blood pool activity: SUV max 2.34 Liver activity: SUV max NA NECK: No hypermetabolic lymph nodes in the neck. Incidental CT findings: None. CHEST: No hypermetabolic mediastinal or hilar nodes. No suspicious pulmonary nodules on the CT scan. Incidental CT findings: Mild cardiac enlargement. Aortic atherosclerosis and coronary artery calcification ABDOMEN/PELVIS: No abnormal tracer uptake identified within the liver, pancreas, spleen, or  adrenal glands. No tracer avid lymph nodes within the abdomen. There is a left pelvic sidewall lymph node which is mildly FDG avid measuring 6 mm short axis with SUV max of 2.86, image 168/4. Tracer avid cervical mass measures 8.3 x 6.0 cm with SUV max 18.83, image 175/4. Incidental CT findings: No ascites. No signs of peritoneal nodularity. SKELETON: No focal hypermetabolic activity to suggest skeletal metastasis. Incidental CT findings: None. IMPRESSION: 1. Hypermetabolic cervical mass consistent with known cervical carcinoma. 2. There is a mildly FDG avid left pelvic sidewall lymph node which is equivocal for nodal metastasis. 3. No additional sites of disease identified. 4. Aortic Atherosclerosis (ICD10-I70.0). Electronically Signed   By: Signa Kell M.D.   On: 12/15/2022 08:36   CT PELVIS W CONTRAST  Result Date: 11/24/2022 CLINICAL DATA:  Cervical cancer, staging. Heavy vaginal bleeding. Rectal bleeding. EXAM: CT  PELVIS WITH CONTRAST TECHNIQUE: Multidetector CT imaging of the pelvis was performed using the standard protocol following the bolus administration of intravenous contrast. RADIATION DOSE REDUCTION: This exam was performed according to the departmental dose-optimization program which includes automated exposure control, adjustment of the mA and/or kV according to patient size and/or use of iterative reconstruction technique. CONTRAST:  75mL OMNIPAQUE IOHEXOL 350 MG/ML SOLN COMPARISON:  Pelvic ultrasound 11/24/2022. FINDINGS: Urinary Tract:  None. Bowel: Visualized bowel is grossly unremarkable. The below described cervical mass does contact the rectum. Vascular/Lymphatic: Vascular structures are unremarkable. Scattered iliac chain lymph nodes are not enlarged by CT size criteria. Reproductive: Heterogeneous cervical mass measures 6.3 x 7.8 cm and contains at least 1 locule of air. Uterus is visualized. No adnexal mass. Other:  No free fluid. Musculoskeletal: None. IMPRESSION: 1. Heterogeneous cervical mass is highly worrisome for cervical carcinoma. Significant border of contact with the adjacent rectum. Difficult to exclude invasion. 2. Locule of air within the cervical mass may be due to necrosis or extension from the vagina. Difficult to exclude a fistula to the rectum. Electronically Signed   By: Leanna Battles M.D.   On: 11/24/2022 13:31   US Pelvis Complete  Result Date: 11/24/2022 CLINICAL DATA:  52 year old female with postmenopausal bleeding. EXAM: TRANSABDOMINAL ULTRASOUND OF PELVIS TECHNIQUE: Transabdominal ultrasound examination of the pelvis was performed including evaluation of the uterus, ovaries, adnexal regions, and pelvic cul-de-sac. COMPARISON:  None Available. FINDINGS: Uterus Measurements: 9.9 x 3.9 x 5.1 cm = volume: 104 mL. Uterine body and fundus myometrium seems within normal limits. But there is a bulky and lobulated lower uterine segment mass (series 1, image 22) which is  approximately 6.4 x 5.4 x 7.6 cm, is solid and has internal vascular elements (series 1, image 69). This seems inseparable from the cervix. Endometrium Thickness: 6 mm. No endometrial abnormality above the lower uterine segment mass. Right ovary Measurements: 1.4 x 1.0 x 1.0 cm = volume: 1 mL. Normal appearance/no adnexal mass. Left ovary Measurements: 1.9 x 1.1 x 1.3 cm = volume: 1 mL. Normal appearance/no adnexal mass. Other findings:  No pelvis free fluid. IMPRESSION: 1. Large, approximately 7.6 cm vascular soft tissue mass of the lower uterine segment. This seems inseparable from the cervix and Cervical Carcinoma is not excluded. Alternatively this might be a large lower uterine fibroid. Recommend direct visualization of the cervix. 2. Elsewhere the uterus and endometrium are within normal limits. Normal ovaries and no free fluid. Electronically Signed   By: Odessa Fleming M.D.   On: 11/24/2022 07:07

## 2022-12-20 NOTE — Telephone Encounter (Signed)
Called Lorraine Yates and she if feeling fine today.  She denies feeling tired or dizzy and mentioned that she has not been symptomatic with her low HGB and didn't feel any difference after receiving the blood transfusion on Friday. Discussed that Dr. Bertis Ruddy would like to recheck labs at 1:15 today and see her at 1:45.  Lorraine Yates asked to come in at 1:30 for labs and can see Dr. Bertis Ruddy at 1:45.  Appointments have been scheduled.

## 2022-12-21 ENCOUNTER — Encounter: Payer: Self-pay | Admitting: Oncology

## 2022-12-21 LAB — SURGICAL PATHOLOGY

## 2022-12-21 NOTE — Progress Notes (Signed)
Requested PD-L1 on accession [id number] with [facility] Pathology via email.

## 2022-12-22 ENCOUNTER — Inpatient Hospital Stay: Payer: BC Managed Care – PPO

## 2022-12-22 ENCOUNTER — Other Ambulatory Visit: Payer: Self-pay

## 2022-12-22 ENCOUNTER — Other Ambulatory Visit: Payer: Self-pay | Admitting: *Deleted

## 2022-12-22 VITALS — BP 106/76 | HR 84 | Temp 99.0°F | Resp 18

## 2022-12-22 DIAGNOSIS — D508 Other iron deficiency anemias: Secondary | ICD-10-CM

## 2022-12-22 DIAGNOSIS — C531 Malignant neoplasm of exocervix: Secondary | ICD-10-CM

## 2022-12-22 DIAGNOSIS — C539 Malignant neoplasm of cervix uteri, unspecified: Secondary | ICD-10-CM | POA: Diagnosis not present

## 2022-12-22 DIAGNOSIS — N939 Abnormal uterine and vaginal bleeding, unspecified: Secondary | ICD-10-CM

## 2022-12-22 DIAGNOSIS — Z95828 Presence of other vascular implants and grafts: Secondary | ICD-10-CM

## 2022-12-22 MED ORDER — SODIUM CHLORIDE 0.9% FLUSH
10.0000 mL | Freq: Once | INTRAVENOUS | Status: AC | PRN
  Administered 2022-12-22: 10 mL

## 2022-12-22 MED ORDER — LIDOCAINE-PRILOCAINE 2.5-2.5 % EX CREA
1.0000 | TOPICAL_CREAM | CUTANEOUS | 1 refills | Status: DC | PRN
Start: 2022-12-22 — End: 2023-06-21

## 2022-12-22 MED ORDER — SODIUM CHLORIDE 0.9 % IV SOLN: Freq: Once | INTRAVENOUS | Status: AC

## 2022-12-22 MED ORDER — HEPARIN SOD (PORK) LOCK FLUSH 100 UNIT/ML IV SOLN
500.0000 [IU] | Freq: Once | INTRAVENOUS | Status: AC | PRN
  Administered 2022-12-22: 500 [IU]

## 2022-12-22 MED ORDER — SODIUM CHLORIDE 0.9 % IV SOLN
400.0000 mg | Freq: Once | INTRAVENOUS | Status: AC
  Administered 2022-12-22: 400 mg via INTRAVENOUS
  Filled 2022-12-22: qty 200

## 2022-12-22 NOTE — Patient Instructions (Signed)

## 2022-12-28 ENCOUNTER — Telehealth: Payer: Self-pay

## 2022-12-28 NOTE — Telephone Encounter (Signed)
Returned her call regarding FMLA paper work for her husband. Told her that I checked with FMLA/ disability department and they have not received anything. She will get them to refax the paper work, given fax #.

## 2022-12-29 ENCOUNTER — Encounter: Payer: Self-pay | Admitting: Oncology

## 2022-12-29 ENCOUNTER — Inpatient Hospital Stay: Payer: BC Managed Care – PPO

## 2022-12-29 ENCOUNTER — Other Ambulatory Visit: Payer: Self-pay

## 2022-12-29 VITALS — BP 132/68 | HR 90 | Temp 98.1°F | Resp 18

## 2022-12-29 DIAGNOSIS — N939 Abnormal uterine and vaginal bleeding, unspecified: Secondary | ICD-10-CM

## 2022-12-29 DIAGNOSIS — D508 Other iron deficiency anemias: Secondary | ICD-10-CM

## 2022-12-29 DIAGNOSIS — C539 Malignant neoplasm of cervix uteri, unspecified: Secondary | ICD-10-CM | POA: Diagnosis not present

## 2022-12-29 MED ORDER — SODIUM CHLORIDE 0.9 % IV SOLN: Freq: Once | INTRAVENOUS | Status: AC

## 2022-12-29 MED ORDER — HEPARIN SOD (PORK) LOCK FLUSH 100 UNIT/ML IV SOLN
500.0000 [IU] | Freq: Once | INTRAVENOUS | Status: AC | PRN
  Administered 2022-12-29: 500 [IU]

## 2022-12-29 MED ORDER — SODIUM CHLORIDE 0.9% FLUSH
10.0000 mL | Freq: Once | INTRAVENOUS | Status: AC | PRN
  Administered 2022-12-29: 10 mL

## 2022-12-29 MED ORDER — SODIUM CHLORIDE 0.9 % IV SOLN
400.0000 mg | Freq: Once | INTRAVENOUS | Status: AC
  Administered 2022-12-29: 400 mg via INTRAVENOUS
  Filled 2022-12-29: qty 400

## 2022-12-29 NOTE — Progress Notes (Signed)
Met with Lorraine Yates in infusion and advised her that we are waiting for the PD-L1 results and that they should be back by early next week.  Advised her that we will call her once they are resulted.

## 2023-01-05 ENCOUNTER — Inpatient Hospital Stay: Payer: BC Managed Care – PPO

## 2023-01-05 ENCOUNTER — Other Ambulatory Visit: Payer: Self-pay

## 2023-01-05 VITALS — BP 103/71 | HR 85 | Temp 98.6°F | Resp 16

## 2023-01-05 DIAGNOSIS — N939 Abnormal uterine and vaginal bleeding, unspecified: Secondary | ICD-10-CM

## 2023-01-05 DIAGNOSIS — D508 Other iron deficiency anemias: Secondary | ICD-10-CM

## 2023-01-05 DIAGNOSIS — C539 Malignant neoplasm of cervix uteri, unspecified: Secondary | ICD-10-CM | POA: Diagnosis not present

## 2023-01-05 MED ORDER — SODIUM CHLORIDE 0.9 % IV SOLN
400.0000 mg | Freq: Once | INTRAVENOUS | Status: AC
  Administered 2023-01-05: 400 mg via INTRAVENOUS
  Filled 2023-01-05: qty 400

## 2023-01-05 MED ORDER — HEPARIN SOD (PORK) LOCK FLUSH 100 UNIT/ML IV SOLN
250.0000 [IU] | Freq: Once | INTRAVENOUS | Status: AC | PRN
  Administered 2023-01-05: 500 [IU]

## 2023-01-05 MED ORDER — SODIUM CHLORIDE 0.9% FLUSH
3.0000 mL | Freq: Once | INTRAVENOUS | Status: AC | PRN
  Administered 2023-01-05: 10 mL

## 2023-01-05 MED ORDER — SODIUM CHLORIDE 0.9 % IV SOLN: INTRAVENOUS | Status: DC

## 2023-01-05 NOTE — Progress Notes (Signed)
Pt declined to stay for 30 minute post infusion observation period. VS consistent with previous at time of discharge. Pt left ambulatory to lobby for discharge.

## 2023-01-05 NOTE — Patient Instructions (Signed)

## 2023-01-06 ENCOUNTER — Telehealth: Payer: Self-pay

## 2023-01-06 NOTE — Telephone Encounter (Signed)
Received a message from husband, Molly Maduro upset that FMLA paper work has not been filled out. Left a message for Cristan after checking with [facility] department. The office has not received anything. Ask for call back to the office. Left email address for fmla, left office fax # and main fax # to be faxed to fmla department to be filled out.

## 2023-01-07 ENCOUNTER — Telehealth: Payer: Self-pay | Admitting: Oncology

## 2023-01-07 ENCOUNTER — Encounter: Payer: Self-pay | Admitting: Hematology and Oncology

## 2023-01-07 NOTE — Telephone Encounter (Signed)
Called Lorraine Yates and advised that the PD-L1 testing is not resulted yet.  Advised I will let her know as soon as it is resulted.  She verbalized understanding and agreement.  She also apologized for her husband's message about his FMLA paperwork. It was faxed to the wrong number and will be refaxed to the [facility]'s number.

## 2023-01-10 ENCOUNTER — Telehealth: Payer: Self-pay | Admitting: Oncology

## 2023-01-10 ENCOUNTER — Other Ambulatory Visit: Payer: Self-pay | Admitting: Hematology and Oncology

## 2023-01-10 ENCOUNTER — Encounter: Payer: Self-pay | Admitting: Hematology and Oncology

## 2023-01-10 DIAGNOSIS — N939 Abnormal uterine and vaginal bleeding, unspecified: Secondary | ICD-10-CM

## 2023-01-10 DIAGNOSIS — C539 Malignant neoplasm of cervix uteri, unspecified: Secondary | ICD-10-CM

## 2023-01-10 NOTE — Addendum Note (Signed)
Encounter addended by: Edward Qualia on: 01/10/2023 12:15 PM  Actions taken: Imaging Exam ended

## 2023-01-10 NOTE — Addendum Note (Signed)
Encounter addended by: Edward Qualia on: 01/10/2023 12:13 PM  Actions taken: Imaging Exam ended

## 2023-01-10 NOTE — Telephone Encounter (Signed)
Called Lorraine Yates and asked if she come in for labs today or tomorrow and to see Dr. Bertis Ruddy tomorrow at 2:30.  She said she would like to have labs tomorrow through her port.  Port Flush/Lab apt scheduled at 1:45 tomorrow followed by Dr. Maxine Glenn apt at 2:30.  She verbalized understanding and agreement of appointments.

## 2023-01-10 NOTE — Addendum Note (Signed)
Encounter addended by: Edward Qualia on: 01/10/2023 12:11 PM  Actions taken: Imaging Exam ended

## 2023-01-11 ENCOUNTER — Encounter: Payer: Self-pay | Admitting: Hematology and Oncology

## 2023-01-11 ENCOUNTER — Inpatient Hospital Stay: Payer: BC Managed Care – PPO

## 2023-01-11 ENCOUNTER — Inpatient Hospital Stay: Payer: BC Managed Care – PPO | Admitting: Hematology and Oncology

## 2023-01-11 ENCOUNTER — Telehealth: Payer: Self-pay

## 2023-01-11 VITALS — BP 115/67 | HR 94 | Temp 98.7°F | Resp 18 | Ht 64.0 in | Wt 155.4 lb

## 2023-01-11 DIAGNOSIS — C539 Malignant neoplasm of cervix uteri, unspecified: Secondary | ICD-10-CM | POA: Insufficient documentation

## 2023-01-11 DIAGNOSIS — Z5112 Encounter for antineoplastic immunotherapy: Secondary | ICD-10-CM | POA: Insufficient documentation

## 2023-01-11 DIAGNOSIS — D508 Other iron deficiency anemias: Secondary | ICD-10-CM

## 2023-01-11 DIAGNOSIS — Z510 Encounter for antineoplastic radiation therapy: Secondary | ICD-10-CM | POA: Diagnosis present

## 2023-01-11 DIAGNOSIS — G893 Neoplasm related pain (acute) (chronic): Secondary | ICD-10-CM

## 2023-01-11 DIAGNOSIS — N939 Abnormal uterine and vaginal bleeding, unspecified: Secondary | ICD-10-CM

## 2023-01-11 DIAGNOSIS — Z5111 Encounter for antineoplastic chemotherapy: Secondary | ICD-10-CM | POA: Insufficient documentation

## 2023-01-11 DIAGNOSIS — D61818 Other pancytopenia: Secondary | ICD-10-CM | POA: Insufficient documentation

## 2023-01-11 DIAGNOSIS — K5909 Other constipation: Secondary | ICD-10-CM | POA: Insufficient documentation

## 2023-01-11 DIAGNOSIS — C531 Malignant neoplasm of exocervix: Secondary | ICD-10-CM

## 2023-01-11 DIAGNOSIS — R197 Diarrhea, unspecified: Secondary | ICD-10-CM | POA: Insufficient documentation

## 2023-01-11 DIAGNOSIS — D509 Iron deficiency anemia, unspecified: Secondary | ICD-10-CM | POA: Insufficient documentation

## 2023-01-11 LAB — COMPREHENSIVE METABOLIC PANEL
ALT: 5 U/L (ref 0–44)
AST: 11 U/L — ABNORMAL LOW (ref 15–41)
Albumin: 3.4 g/dL — ABNORMAL LOW (ref 3.5–5.0)
Alkaline Phosphatase: 62 U/L (ref 38–126)
Anion gap: 4 — ABNORMAL LOW (ref 5–15)
BUN: 10 mg/dL (ref 6–20)
CO2: 28 mmol/L (ref 22–32)
Calcium: 9 mg/dL (ref 8.9–10.3)
Chloride: 106 mmol/L (ref 98–111)
Creatinine, Ser: 0.67 mg/dL (ref 0.44–1.00)
GFR, Estimated: >60 mL/min (ref >60)
Glucose, Bld: 90 mg/dL (ref 70–99)
Potassium: 3.9 mmol/L (ref 3.5–5.1)
Sodium: 138 mmol/L (ref 135–145)
Total Bilirubin: 0.2 mg/dL — ABNORMAL LOW (ref 0.3–1.2)
Total Protein: 6.2 g/dL — ABNORMAL LOW (ref 6.5–8.1)

## 2023-01-11 LAB — CBC WITH DIFFERENTIAL/PLATELET
Abs Immature Granulocytes: 0.03 10*3/uL (ref 0.00–0.07)
Basophils Absolute: 0.1 10*3/uL (ref 0.0–0.1)
Basophils Relative: 1 %
Eosinophils Absolute: 0.3 10*3/uL (ref 0.0–0.5)
Eosinophils Relative: 4 %
HCT: 21.5 % — ABNORMAL LOW (ref 36.0–46.0)
Hemoglobin: 6.5 g/dL — CL (ref 12.0–15.0)
Immature Granulocytes: 1 %
Lymphocytes Relative: 22 %
Lymphs Abs: 1.4 10*3/uL (ref 0.7–4.0)
MCH: 29.1 pg (ref 26.0–34.0)
MCHC: 30.7 g/dL (ref 30.0–36.0)
MCV: 94.7 fL (ref 80.0–100.0)
Monocytes Absolute: 0.5 10*3/uL (ref 0.1–1.0)
Monocytes Relative: 9 %
Neutro Abs: 4 10*3/uL (ref 1.7–7.7)
Neutrophils Relative %: 63 %
Platelets: 519 10*3/uL — ABNORMAL HIGH (ref 150–400)
RBC: 2.27 MIL/uL — ABNORMAL LOW (ref 3.87–5.11)
RDW: 17.2 % — ABNORMAL HIGH (ref 11.5–15.5)
WBC: 6.3 10*3/uL (ref 4.0–10.5)
nRBC: 0 % (ref 0.0–0.2)

## 2023-01-11 LAB — IRON AND IRON BINDING CAPACITY (CC-WL,HP ONLY)
Iron: 18 ug/dL — ABNORMAL LOW (ref 28–170)
Saturation Ratios: 7 % — ABNORMAL LOW (ref 10.4–31.8)
TIBC: 259 ug/dL (ref 250–450)
UIBC: 241 ug/dL (ref 148–442)

## 2023-01-11 LAB — SAMPLE TO BLOOD BANK

## 2023-01-11 LAB — FERRITIN: Ferritin: 148 ng/mL (ref 11–307)

## 2023-01-11 MED ORDER — OXYCODONE HCL 5 MG PO TABS: 5.0000 mg | ORAL_TABLET | ORAL | 0 refills | Status: DC | PRN

## 2023-01-11 MED ORDER — PROCHLORPERAZINE MALEATE 10 MG PO TABS
10.0000 mg | ORAL_TABLET | Freq: Four times a day (QID) | ORAL | 1 refills | Status: DC | PRN
Start: 2023-01-11 — End: 2023-01-21

## 2023-01-11 MED ORDER — HEPARIN SOD (PORK) LOCK FLUSH 100 UNIT/ML IV SOLN: 500.0000 [IU] | Freq: Once | INTRAVENOUS | Status: DC

## 2023-01-11 MED ORDER — ONDANSETRON HCL 8 MG PO TABS
8.0000 mg | ORAL_TABLET | Freq: Three times a day (TID) | ORAL | 1 refills | Status: DC | PRN
Start: 2023-01-11 — End: 2023-03-31

## 2023-01-11 MED ORDER — SODIUM CHLORIDE 0.9% FLUSH: 10.0000 mL | Freq: Once | INTRAVENOUS | Status: DC

## 2023-01-11 NOTE — Progress Notes (Signed)
CRITICAL VALUE STICKER  CRITICAL VALUE: Hgb 6.5  RECEIVER (on-site recipient of call): Tilford Pillar, RN  DATE & TIME NOTIFIED: 01/11/23 AT 1419  MESSENGER (representative from lab): Murvin Donning.  MD NOTIFIED: Dr. Artis Delay  TIME OF NOTIFICATION: 01/11/23 at 1422  RESPONSE:  Will order blood transfusion.

## 2023-01-11 NOTE — Progress Notes (Addendum)
DISCONTINUE OFF PATHWAY REGIMEN - Other   [id number]:Cisplatin 40 mg/m2 IV D1 q7 Days + RT:   A cycle is every 7 days:     Cisplatin   **Always confirm dose/schedule in your pharmacy ordering system**  REASON: Other Reason PRIOR TREATMENT: Cisplatin 40 mg/m2 IV D1 q7 Days + RT TREATMENT RESPONSE: Unable to Evaluate  START OFF PATHWAY REGIMEN - Other   [id number]:Cisplatin 40 mg/m2 IV D1 q7 Days + RT:   A cycle is every 7 days:     Cisplatin   **Always confirm dose/schedule in your pharmacy ordering system**  Patient Characteristics: Intent of Therapy: Curative Intent, Discussed with Patient

## 2023-01-11 NOTE — Progress Notes (Signed)
START OFF PATHWAY REGIMEN - Other   [id number]:Cisplatin 40 mg/m2 IV D1 q7 Days + RT:   A cycle is every 7 days:     Cisplatin   **Always confirm dose/schedule in your pharmacy ordering system**  Patient Characteristics: Intent of Therapy: Curative Intent, Discussed with Patient 

## 2023-01-11 NOTE — Telephone Encounter (Signed)
Called her and told her Oxycodone sent to her preferred pharmacy. Pathology came back and she is positive for PDL1 and Dr. Bertis Ruddy will be adding immune therapy to treatment. She verbalized understanding.

## 2023-01-12 ENCOUNTER — Other Ambulatory Visit: Payer: Self-pay

## 2023-01-12 ENCOUNTER — Encounter: Payer: Self-pay | Admitting: Hematology and Oncology

## 2023-01-12 ENCOUNTER — Ambulatory Visit
Admission: RE | Admit: 2023-01-12 | Discharge: 2023-01-12 | Disposition: A | Discharge status: Home / Self Care | Payer: BC Managed Care – PPO | Source: Ambulatory Visit | Attending: Radiation Oncology | Admitting: Radiation Oncology

## 2023-01-12 VITALS — BP 98/73 | HR 97 | Temp 98.5°F | Resp 18 | Wt 155.4 lb

## 2023-01-12 DIAGNOSIS — C531 Malignant neoplasm of exocervix: Secondary | ICD-10-CM

## 2023-01-12 DIAGNOSIS — G893 Neoplasm related pain (acute) (chronic): Secondary | ICD-10-CM | POA: Insufficient documentation

## 2023-01-12 DIAGNOSIS — Z510 Encounter for antineoplastic radiation therapy: Secondary | ICD-10-CM | POA: Diagnosis not present

## 2023-01-12 LAB — PREPARE RBC (CROSSMATCH)

## 2023-01-12 NOTE — Progress Notes (Addendum)
[facility] OFFICE PROGRESS NOTE  Patient Care Team: Patient, No Pcp Per as PCP - General (General Practice)  ASSESSMENT & PLAN:  Cervical cancer (HCC) Unfortunately, there is excessive delay of getting her molecular studies from pathology Ultimately, after many phone calls, we received a report that her tumor tested positive for PD-L1 I recommend concurrent chemoradiation therapy with cisplatin and pembrolizumab with curative intent We discussed risk, benefits, side effects of chemotherapy including hearing loss, neuropathy, pancytopenia, risk of allergic reaction, kidney failure, abnormal thyroid function etc. and she is willing to proceed I recommend chemo education class, transfusion support and IV iron She will be seen weekly along with radiation therapy for the first few weeks of her treatment  Iron deficiency anemia Unfortunately, despite aggressive intravenous iron infusion, she remain anemic due to excessive bleeding We will start her chemotherapy as soon as possible along with transfusion support  We discussed some of the risks, benefits, and alternatives of blood transfusions. The patient is symptomatic from anemia and the hemoglobin level is critically low.  Some of the side-effects to be expected including risks of transfusion reactions, chills, infection, syndrome of volume overload and risk of hospitalization from various reasons and the patient is willing to proceed and went ahead to sign consent today. 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 Tylenol is no longer helpful I recommend low-dose narcotics and she agreed to proceed  Orders Placed This Encounter  Procedures   Pregnancy, urine    Standing Status:   Standing    Number of Occurrences:   9    Standing Expiration Date:   01/11/2024   CBC with Differential  (Cancer Center Only)    Standing Status:   Future    Standing Expiration Date:   01/20/2024   CMP (Cancer Center only)    Standing Status:   Future    Standing Expiration Date:   01/20/2024   T4    Standing Status:   Future    Standing Expiration Date:   01/20/2024   TSH    Standing Status:   Future    Standing Expiration Date:   01/20/2024   Magnesium    Standing Status:   Future    Standing Expiration Date:   01/20/2024   CBC with Differential (Cancer Center Only)    Standing Status:   Future    Standing Expiration Date:   01/27/2024   Basic Metabolic Panel - Cancer Center Only    Standing Status:   Future    Standing Expiration Date:   01/27/2024   Magnesium    Standing Status:   Future    Standing Expiration Date:   01/27/2024   CBC with Differential (Cancer Center Only)    Standing Status:   Future    Standing Expiration Date:   02/03/2024   Basic Metabolic Panel - Cancer Center Only    Standing Status:   Future    Standing Expiration Date:   02/03/2024   Magnesium    Standing Status:   Future    Standing Expiration Date:   02/03/2024   CBC with Differential (Cancer Center Only)    Standing Status:   Future    Standing Expiration Date:   02/10/2024   CMP (Cancer Center only)    Standing Status:   Future    Standing Expiration Date:  02/10/2024   Magnesium    Standing Status:   Future    Standing Expiration Date:   02/10/2024   CBC with Differential (Cancer Center Only)    Standing Status:   Future    Standing Expiration Date:   02/17/2024   Basic Metabolic Panel - Cancer Center Only    Standing Status:   Future    Standing Expiration Date:   02/17/2024   Magnesium    Standing Status:   Future    Standing Expiration Date:   02/17/2024   CBC with Differential (Cancer Center Only)    Standing Status:   Future    Standing Expiration Date:   02/24/2024   Basic Metabolic Panel - Cancer Center Only    Standing Status:   Future    Standing Expiration Date:   02/24/2024   Magnesium     Standing Status:   Future    Standing Expiration Date:   02/24/2024   CBC with Differential (Cancer Center Only)    Standing Status:   Future    Standing Expiration Date:   03/02/2024   CMP (Cancer Center only)    Standing Status:   Future    Standing Expiration Date:   03/02/2024   T4    Standing Status:   Future    Standing Expiration Date:   03/02/2024   TSH    Standing Status:   Future    Standing Expiration Date:   03/02/2024   CBC with Differential (Cancer Center Only)    Standing Status:   Future    Standing Expiration Date:   03/23/2024   CMP (Cancer Center only)    Standing Status:   Future    Standing Expiration Date:   03/23/2024   CBC with Differential (Cancer Center Only)    Standing Status:   Future    Standing Expiration Date:   04/13/2024   CMP (Cancer Center only)    Standing Status:   Future    Standing Expiration Date:   04/13/2024   CBC with Differential (Cancer Center Only)    Standing Status:   Future    Standing Expiration Date:   05/04/2024   CMP (Cancer Center only)    Standing Status:   Future    Standing Expiration Date:   05/04/2024   T4    Standing Status:   Future    Standing Expiration Date:   05/04/2024   TSH    Standing Status:   Future    Standing Expiration Date:   05/04/2024   CBC with Differential (Cancer Center Only)    Standing Status:   Future    Standing Expiration Date:   06/15/2024   CMP (Cancer Center only)    Standing Status:   Future    Standing Expiration Date:   06/15/2024   T4    Standing Status:   Future    Standing Expiration Date:   06/15/2024   TSH    Standing Status:   Future    Standing Expiration Date:   06/15/2024   CBC with Differential (Cancer Center Only)    Standing Status:   Future    Standing Expiration Date:   07/27/2024   CMP (Cancer Center only)    Standing Status:   Future    Standing Expiration Date:   07/27/2024   T4    Standing Status:   Future    Standing Expiration Date:   07/27/2024   TSH    Standing  Status:   Future    Standing Expiration Date:   07/27/2024   Magnesium    Standing Status:   Future    Standing Expiration Date:   01/11/2024   TSH    Standing Status:   Future    Standing Expiration Date:   01/11/2024   Pregnancy, urine    Standing Status:   Standing    Number of Occurrences:   9    Standing Expiration Date:   01/11/2024   Informed Consent Details: Physician/Practitioner Attestation; Transcribe to consent form and obtain patient signature    Standing Status:   Future    Standing Expiration Date:   01/11/2024    Order Specific Question:   Physician/Practitioner attestation of informed consent for blood and or blood product transfusion    Answer:   I, the physician/practitioner, attest that I have discussed with the patient the benefits, risks, side effects, alternatives, likelihood of achieving goals and potential problems during recovery for the procedure that I have provided informed consent.    Order Specific Question:   Product(s)    Answer:   All Product(s)   Care order/instruction    Transfuse Parameters    Standing Status:   Future    Standing Expiration Date:   01/11/2024   Type and screen         Standing Status:   Future    Standing Expiration Date:   01/11/2024   Prepare RBC (crossmatch)    Standing Status:   Standing    Number of Occurrences:   1    Order Specific Question:   # of Units    Answer:   2 units    Order Specific Question:   Transfusion Indications    Answer:   Hemoglobin < 7 gm/dL and symptomatic    Order Specific Question:   Number of Units to Keep Ahead    Answer:   NO units ahead    Order Specific Question:   Instructions:    Answer:   Transfuse    Order Specific Question:   If emergent release call blood bank    Answer:   Not emergent release    All questions were answered. The patient knows to call the clinic with any problems, questions or concerns. The total time spent in the appointment was 40 minutes encounter with patients including  review of chart and various tests results, discussions about plan of care and coordination of care plan   Artis Delay, MD 01/12/2023 7:36 AM  INTERVAL HISTORY: Please see below for problem oriented charting. she returns for treatment follow-up with her husband She continues to have excessive bleeding She complains of fatigue No dizziness or chest pain We reviewed test results and discussed treatment options and the role of transfusion support  REVIEW OF SYSTEMS:   Constitutional: Denies fevers, chills or abnormal weight loss Eyes: Denies blurriness of vision Ears, nose, mouth, throat, and face: Denies mucositis or sore throat Respiratory: Denies cough, dyspnea or wheezes Cardiovascular: Denies palpitation, chest discomfort or lower extremity swelling Gastrointestinal:  Denies nausea, heartburn or change in bowel habits Skin: Denies abnormal skin rashes Lymphatics: Denies new lymphadenopathy or easy bruising Neurological:Denies numbness, tingling or new weaknesses Behavioral/Psych: Mood is stable, no new changes  All other systems were reviewed with the patient and are negative.  I have reviewed the past medical history, past surgical history, social history and family history with the patient and they are unchanged from previous note.  ALLERGIES:  has  No Known Allergies.  MEDICATIONS:  Current Outpatient Medications  Medication Sig Dispense Refill   oxyCODONE (OXY IR/ROXICODONE) 5 MG immediate release tablet Take 1 tablet (5 mg total) by mouth every 4 (four) hours as needed for severe pain. 30 tablet 0   ferrous sulfate 324 MG TBEC Take 324 mg by mouth 2 (two) times daily.     lidocaine-prilocaine (EMLA) cream Apply 1 Application topically as needed. Apply to port site 1 hour to 30 minutes before port is accessed 30 g 1   ondansetron (ZOFRAN) 8 MG tablet Take 1 tablet (8 mg total) by mouth every 8 (eight) hours as needed for nausea or vomiting. Start on the third day after cisplatin.  30 tablet 1   prochlorperazine (COMPAZINE) 10 MG tablet Take 1 tablet (10 mg total) by mouth every 6 (six) hours as needed (Nausea or vomiting). 30 tablet 1   No current facility-administered medications for this visit.    SUMMARY OF ONCOLOGIC HISTORY: Oncology History Overview Note  PD-L1 CPS 1%   Cervical cancer (HCC)  11/24/2022 Initial Diagnosis   The patient presented recently to the emergency department with both vaginal bleeding and rectal bleeding.  Exam in the emergency department showed a 6 cm irregular cervical mass.     11/24/2022 Imaging   CT pelvis 1. Heterogeneous cervical mass is highly worrisome for cervical carcinoma. Significant border of contact with the adjacent rectum. Difficult to exclude invasion. 2. Locule of air within the cervical mass may be due to necrosis or extension from the vagina. Difficult to exclude a fistula to the rectum.   11/24/2022 Imaging   US pelvis 1. Large, approximately 7.6 cm vascular soft tissue mass of the lower uterine segment. This seems inseparable from the cervix and Cervical Carcinoma is not excluded. Alternatively this might be a large lower uterine fibroid. Recommend direct visualization of the cervix.   2. Elsewhere the uterus and endometrium are within normal limits. Normal ovaries and no free fluid.   11/26/2022 Pathology Results   SURGICAL PATHOLOGY  CASE: [id number]  PATIENT: Deangela Shimer  Surgical Pathology Report   FINAL MICROSCOPIC DIAGNOSIS:   A. 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  See comment.   COMMENT:   - Morphologic evaluation and p16 immunohistochemical stain reveal superficial squamous mucosa with findings consistent with high-grade squamous intraepithelial lesion.  The patient's clinical history of a cervical mass is noted, and a deeper more invasive process cannot be excluded.  Clinical and imaging correlation is recommended.      11/29/2022 Initial Diagnosis    Cervical cancer (HCC)   12/02/2022 Cancer Staging   Staging form: Cervix Uteri, AJCC Version 9 - Clinical stage from 12/02/2022: Stage IIIC1 (cT1b3, cN1, cM0) - Signed by Artis Delay, MD on 12/15/2022 Stage prefix: Initial diagnosis   12/15/2022 PET scan   1. Hypermetabolic cervical mass consistent with known cervical carcinoma. 2. There is a mildly FDG avid left pelvic sidewall lymph node which is equivocal for nodal metastasis. 3. No additional sites of disease identified. 4. Aortic Atherosclerosis (ICD10-I70.0).     12/16/2022 Procedure   Successful placement of a right internal jugular approach power injectable Port-A-Cath. The catheter is ready for immediate use.     12/17/2022 Pathology Results   SURGICAL PATHOLOGY  CASE: [id number]  PATIENT: Berneda Newbury  Surgical Pathology Report   Clinical History: malignant neoplasm of cervix, unspecified site   FINAL MICROSCOPIC DIAGNOSIS:   A. 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     12/18/2022 Imaging   1. The normal cervical anatomy is completely effaced by a bulky mass measuring 7.9 x 6.9 x 5.5 cm. Mass extends into the lower uterine segment with loss of parametrial definition about the posterior 180 degrees of the upper portion of the mass. Mass remains confined to the upper third of the vagina. 2. Preserved fat planes to the adjacent bladder and rectum. No ureteral invasion or hydronephrosis. 3. Small left pelvic sidewall lymph node measuring 0.9 x 0.6 cm, previously with equivocal FDG avidity and suspicious for a small nodal metastasis. No other enlarged lymph nodes in the pelvis.     01/20/2023 - 01/20/2023 Chemotherapy   Patient is on Treatment Plan : HEAD/NECK Cisplatin (40) q7d     01/20/2023 -  Chemotherapy   Patient is on Treatment Plan : Cervical Pembrolizumab (200) q21d, cisplatin  (40) q7d + XRT / pembrolizumab (400) q42d        PHYSICAL EXAMINATION: ECOG PERFORMANCE STATUS: 1 - Symptomatic but completely ambulatory  Vitals:   01/11/23 1412  BP: 115/67  Pulse: 94  Resp: 18  Temp: 98.7 F (37.1 C)  SpO2: 100%   Filed Weights   01/11/23 1412  Weight: 155 lb 6.4 oz (70.5 kg)    GENERAL:alert, no distress and comfortable.  She has a conjunctival pallor NEURO: alert & oriented x 3 with fluent speech, no focal motor/sensory deficits  LABORATORY DATA:  I have reviewed the data as listed    Component Value Date/Time   NA 138 01/11/2023 1355   K 3.9 01/11/2023 1355   CL 106 01/11/2023 1355   CO2 28 01/11/2023 1355   GLUCOSE 90 01/11/2023 1355   BUN 10 01/11/2023 1355   CREATININE 0.67 01/11/2023 1355   CALCIUM 9.0 01/11/2023 1355   PROT 6.2 (L) 01/11/2023 1355   ALBUMIN 3.4 (L) 01/11/2023 1355   AST 11 (L) 01/11/2023 1355   ALT 5 01/11/2023 1355   ALKPHOS 62 01/11/2023 1355   BILITOT 0.2 (L) 01/11/2023 1355   GFRNONAA >60 01/11/2023 1355    No results found for: "SPEP", "UPEP"  Lab Results  Component Value Date   WBC 6.3 01/11/2023   NEUTROABS 4.0 01/11/2023   HGB 6.5 (LL) 01/11/2023   HCT 21.5 (L) 01/11/2023   MCV 94.7 01/11/2023   PLT 519 (H) 01/11/2023      Chemistry      Component Value Date/Time   NA 138 01/11/2023 1355   K 3.9 01/11/2023 1355   CL 106 01/11/2023 1355   CO2 28 01/11/2023 1355   BUN 10 01/11/2023 1355   CREATININE 0.67 01/11/2023 1355      Component Value Date/Time   CALCIUM 9.0 01/11/2023 1355   ALKPHOS 62 01/11/2023 1355   AST 11 (L) 01/11/2023 1355   ALT 5 01/11/2023 1355   BILITOT 0.2 (L) 01/11/2023 1355       RADIOGRAPHIC STUDIES: I have personally reviewed the radiological images as listed and agreed with the findings in the report. MR Pelvis W Wo Contrast  Result Date: 12/18/2022 CLINICAL DATA:  Cervical cancer staging, pelvic mass identified by CT EXAM: MRI PELVIS WITHOUT AND WITH CONTRAST  TECHNIQUE: Multiplanar multisequence MR imaging of the pelvis was performed both before and after administration of intravenous contrast. CONTRAST:  7mL GADAVIST GADOBUTROL 1 MMOL/ML IV SOLN COMPARISON:  CT pelvis, 11/24/2022, PET-CT, 12/14/2022 FINDINGS:  Urinary Tract:  No abnormality visualized. Bowel:  Unremarkable visualized pelvic bowel loops. Vascular/Lymphatic: Small left pelvic sidewall lymph node measuring 0.9 x 0.6 cm, previously with equivocal FDG avidity (series 11, image 18) no other enlarged lymph nodes. No significant vascular abnormality seen. Reproductive: The normal cervical anatomy is completely effaced by a bulky mass measuring 7.9 x 6.9 x 5.5 cm (series 11, image 28, series 3, image 22). Mass extends into the lower uterine segment (series 3, image 24) with loss of parametrial definition about the posterior 180 degrees of the upper portion of the mass (series 11, image 22). Mass remains confined to the upper third of the vagina (series 3, image 20). Preserved fat planes to the adjacent rectum and bladder. Normal ovaries. Other:  None. Musculoskeletal: No suspicious bone lesions identified. IMPRESSION: 1. The normal cervical anatomy is completely effaced by a bulky mass measuring 7.9 x 6.9 x 5.5 cm. Mass extends into the lower uterine segment with loss of parametrial definition about the posterior 180 degrees of the upper portion of the mass. Mass remains confined to the upper third of the vagina. 2. Preserved fat planes to the adjacent bladder and rectum. No ureteral invasion or hydronephrosis. 3. Small left pelvic sidewall lymph node measuring 0.9 x 0.6 cm, previously with equivocal FDG avidity and suspicious for a small nodal metastasis. No other enlarged lymph nodes in the pelvis. Electronically Signed   By: Jearld Lesch M.D.   On: 12/18/2022 18:27   IR IMAGING GUIDED PORT INSERTION  Result Date: 12/16/2022 INDICATION: Cervical cancer. In need of durable intravenous access for chemotherapy  administration. EXAM: IMPLANTED PORT A CATH PLACEMENT WITH ULTRASOUND AND FLUOROSCOPIC GUIDANCE COMPARISON:  PET-CT-12/14/2022 MEDICATIONS: None ANESTHESIA/SEDATION: Moderate (conscious) sedation was employed during this procedure as administered by the Interventional Radiology RN. A total of Versed 4 mg and Fentanyl 100 mcg was administered intravenously. Moderate Sedation Time: 31 minutes. The patient's level of consciousness and vital signs were monitored continuously by radiology nursing throughout the procedure under my direct supervision. CONTRAST:  None FLUOROSCOPY TIME:  24 seconds (1 mGy) COMPLICATIONS: None immediate. PROCEDURE: The procedure, risks, benefits, and alternatives were explained to the patient. Questions regarding the procedure were encouraged and answered. The patient understands and consents to the procedure. The right neck and chest were prepped with chlorhexidine in a sterile fashion, and a sterile drape was applied covering the operative field. Maximum barrier sterile technique with sterile gowns and gloves were used for the procedure. A timeout was performed prior to the initiation of the procedure. Local anesthesia was provided with 1% lidocaine with epinephrine. After creating a small venotomy incision, a micropuncture kit was utilized to access the internal jugular vein. Real-time ultrasound guidance was utilized for vascular access including the acquisition of a permanent ultrasound image documenting patency of the accessed vessel. The microwire was utilized to measure appropriate catheter length. A subcutaneous port pocket was then created along the upper chest wall utilizing a combination of sharp and blunt dissection. The pocket was irrigated with sterile saline. A single lumen clear view power injectable port was chosen for placement. The 8 Fr catheter was tunneled from the port pocket site to the venotomy incision. The port was placed in the pocket. The external catheter was  trimmed to appropriate length. At the venotomy, an 8 Fr peel-away sheath was placed over a guidewire under fluoroscopic guidance. The catheter was then placed through the sheath and the sheath was removed. Final catheter positioning was confirmed and documented with  a fluoroscopic spot radiograph. The port was accessed with a Huber needle, aspirated and flushed with heparinized saline. The venotomy site was closed with an interrupted 4-0 Vicryl suture. The port pocket incision was closed with interrupted 2-0 Vicryl suture. The skin was opposed with a running subcuticular 4-0 Vicryl suture. Dermabond and Steri-strips were applied to both incisions. Dressings were applied. The patient tolerated the procedure well without immediate post procedural complication. FINDINGS: After catheter placement, the tip lies within the superior cavoatrial junction. The catheter aspirates and flushes normally and is ready for immediate use. IMPRESSION: Successful placement of a right internal jugular approach power injectable Port-A-Cath. The catheter is ready for immediate use. Electronically Signed   By: Simonne Come M.D.   On: 12/16/2022 13:04   NM PET Image Initial (PI) Skull Base To Thigh (F-18 FDG)  Result Date: 12/15/2022 CLINICAL DATA:  Initial treatment strategy for cervical cancer. EXAM: NUCLEAR MEDICINE PET SKULL BASE TO THIGH TECHNIQUE: 7.99 mCi F-18 FDG was injected intravenously. Full-ring PET imaging was performed from the skull base to thigh after the radiotracer. CT data was obtained and used for attenuation correction and anatomic localization. Fasting blood glucose: 81 mg/dl COMPARISON:  CT pelvis 11/24/2022 FINDINGS: Mediastinal blood pool activity: SUV max 2.34 Liver activity: SUV max NA NECK: No hypermetabolic lymph nodes in the neck. Incidental CT findings: None. CHEST: No hypermetabolic mediastinal or hilar nodes. No suspicious pulmonary nodules on the CT scan. Incidental CT findings: Mild cardiac enlargement.  Aortic atherosclerosis and coronary artery calcification ABDOMEN/PELVIS: No abnormal tracer uptake identified within the liver, pancreas, spleen, or adrenal glands. No tracer avid lymph nodes within the abdomen. There is a left pelvic sidewall lymph node which is mildly FDG avid measuring 6 mm short axis with SUV max of 2.86, image 168/4. Tracer avid cervical mass measures 8.3 x 6.0 cm with SUV max 18.83, image 175/4. Incidental CT findings: No ascites. No signs of peritoneal nodularity. SKELETON: No focal hypermetabolic activity to suggest skeletal metastasis. Incidental CT findings: None. IMPRESSION: 1. Hypermetabolic cervical mass consistent with known cervical carcinoma. 2. There is a mildly FDG avid left pelvic sidewall lymph node which is equivocal for nodal metastasis. 3. No additional sites of disease identified. 4. Aortic Atherosclerosis (ICD10-I70.0). Electronically Signed   By: Signa Kell M.D.   On: 12/15/2022 08:36

## 2023-01-12 NOTE — Assessment & Plan Note (Addendum)
Unfortunately, there is excessive delay of getting her molecular studies from pathology Ultimately, after many phone calls, we received a report that her tumor tested positive for PD-L1 I recommend concurrent chemoradiation therapy with cisplatin and pembrolizumab with curative intent We discussed risk, benefits, side effects of chemotherapy including hearing loss, neuropathy, pancytopenia, risk of allergic reaction, kidney failure, abnormal thyroid function etc. and she is willing to proceed I recommend chemo education class, transfusion support and IV iron She will be seen weekly along with radiation therapy for the first few weeks of her treatment

## 2023-01-12 NOTE — Assessment & Plan Note (Signed)
Unfortunately, despite aggressive intravenous iron infusion, she remain anemic due to excessive bleeding We will start her chemotherapy as soon as possible along with transfusion support  We discussed some of the risks, benefits, and alternatives of blood transfusions. The patient is symptomatic from anemia and the hemoglobin level is critically low.  Some of the side-effects to be expected including risks of transfusion reactions, chills, infection, syndrome of volume overload and risk of hospitalization from various reasons and the patient is willing to proceed and went ahead to sign consent today. I recommend 2 units of blood this week She will receive weekly IV iron with her treatment and further transfusion support as needed

## 2023-01-12 NOTE — Assessment & Plan Note (Signed)
She has deep pelvic pain Tylenol is no longer helpful I recommend low-dose narcotics and she agreed to proceed

## 2023-01-13 ENCOUNTER — Other Ambulatory Visit: Payer: Self-pay

## 2023-01-14 ENCOUNTER — Inpatient Hospital Stay: Payer: BC Managed Care – PPO

## 2023-01-14 ENCOUNTER — Other Ambulatory Visit: Payer: Self-pay

## 2023-01-14 ENCOUNTER — Other Ambulatory Visit: Payer: Self-pay | Admitting: Hematology and Oncology

## 2023-01-14 DIAGNOSIS — C539 Malignant neoplasm of cervix uteri, unspecified: Secondary | ICD-10-CM

## 2023-01-14 DIAGNOSIS — D508 Other iron deficiency anemias: Secondary | ICD-10-CM

## 2023-01-14 DIAGNOSIS — N939 Abnormal uterine and vaginal bleeding, unspecified: Secondary | ICD-10-CM

## 2023-01-14 DIAGNOSIS — C531 Malignant neoplasm of exocervix: Secondary | ICD-10-CM

## 2023-01-14 DIAGNOSIS — Z510 Encounter for antineoplastic radiation therapy: Secondary | ICD-10-CM | POA: Diagnosis not present

## 2023-01-14 LAB — CBC WITH DIFFERENTIAL/PLATELET
Abs Immature Granulocytes: 0.02 10*3/uL (ref 0.00–0.07)
Basophils Absolute: 0.1 10*3/uL (ref 0.0–0.1)
Basophils Relative: 1 %
Eosinophils Absolute: 0.3 10*3/uL (ref 0.0–0.5)
Eosinophils Relative: 3 %
HCT: 21.3 % — ABNORMAL LOW (ref 36.0–46.0)
Hemoglobin: 6.5 g/dL — CL (ref 12.0–15.0)
Immature Granulocytes: 0 %
Lymphocytes Relative: 23 %
Lymphs Abs: 1.7 10*3/uL (ref 0.7–4.0)
MCH: 28.6 pg (ref 26.0–34.0)
MCHC: 30.5 g/dL (ref 30.0–36.0)
MCV: 93.8 fL (ref 80.0–100.0)
Monocytes Absolute: 0.6 10*3/uL (ref 0.1–1.0)
Monocytes Relative: 8 %
Neutro Abs: 5 10*3/uL (ref 1.7–7.7)
Neutrophils Relative %: 65 %
Platelets: 524 10*3/uL — ABNORMAL HIGH (ref 150–400)
RBC: 2.27 MIL/uL — ABNORMAL LOW (ref 3.87–5.11)
RDW: 16.6 % — ABNORMAL HIGH (ref 11.5–15.5)
WBC: 7.7 10*3/uL (ref 4.0–10.5)
nRBC: 0 % (ref 0.0–0.2)

## 2023-01-14 LAB — COMPREHENSIVE METABOLIC PANEL
ALT: 6 U/L (ref 0–44)
AST: 11 U/L — ABNORMAL LOW (ref 15–41)
Albumin: 3.3 g/dL — ABNORMAL LOW (ref 3.5–5.0)
Alkaline Phosphatase: 75 U/L (ref 38–126)
Anion gap: 7 (ref 5–15)
BUN: 8 mg/dL (ref 6–20)
CO2: 27 mmol/L (ref 22–32)
Calcium: 8.8 mg/dL — ABNORMAL LOW (ref 8.9–10.3)
Chloride: 105 mmol/L (ref 98–111)
Creatinine, Ser: 0.75 mg/dL (ref 0.44–1.00)
GFR, Estimated: >60 mL/min (ref >60)
Glucose, Bld: 95 mg/dL (ref 70–99)
Potassium: 4.1 mmol/L (ref 3.5–5.1)
Sodium: 139 mmol/L (ref 135–145)
Total Bilirubin: 0.2 mg/dL — ABNORMAL LOW (ref 0.3–1.2)
Total Protein: 5.9 g/dL — ABNORMAL LOW (ref 6.5–8.1)

## 2023-01-14 LAB — BPAM RBC

## 2023-01-14 LAB — MAGNESIUM: Magnesium: 2 mg/dL (ref 1.7–2.4)

## 2023-01-14 LAB — TYPE AND SCREEN: Unit division: 0

## 2023-01-14 LAB — SAMPLE TO BLOOD BANK

## 2023-01-14 LAB — TSH: TSH: 1.369 u[IU]/mL (ref 0.350–4.500)

## 2023-01-14 LAB — PREGNANCY, URINE: Preg Test, Ur: NEGATIVE

## 2023-01-14 MED ORDER — HEPARIN SOD (PORK) LOCK FLUSH 100 UNIT/ML IV SOLN
500.0000 [IU] | Freq: Once | INTRAVENOUS | Status: AC | PRN
  Administered 2023-01-14: 500 [IU]

## 2023-01-14 MED ORDER — SODIUM CHLORIDE 0.9% FLUSH
10.0000 mL | Freq: Once | INTRAVENOUS | Status: AC | PRN
  Administered 2023-01-14: 10 mL

## 2023-01-14 NOTE — Progress Notes (Deleted)
CR

## 2023-01-14 NOTE — Progress Notes (Signed)
CRITICAL VALUE STICKER  CRITICAL VALUE: HGB 6.5  RECEIVER (on-site recipient of call): Tilford Pillar, RN  DATE & TIME NOTIFIED: 01/14/23 at 1112  MESSENGER (representative from lab): Sheralyn Boatman   MD NOTIFIED: Dr. Bertis Ruddy  TIME OF NOTIFICATION: 01/14/23 at 1114  RESPONSE:  2 units of blood ordered for tomorrow.

## 2023-01-15 ENCOUNTER — Inpatient Hospital Stay: Payer: BC Managed Care – PPO

## 2023-01-15 ENCOUNTER — Other Ambulatory Visit: Payer: Self-pay

## 2023-01-15 VITALS — BP 105/73 | HR 84 | Temp 98.5°F | Resp 16

## 2023-01-15 DIAGNOSIS — Z510 Encounter for antineoplastic radiation therapy: Secondary | ICD-10-CM | POA: Diagnosis not present

## 2023-01-15 DIAGNOSIS — D508 Other iron deficiency anemias: Secondary | ICD-10-CM

## 2023-01-15 DIAGNOSIS — C531 Malignant neoplasm of exocervix: Secondary | ICD-10-CM

## 2023-01-15 DIAGNOSIS — N939 Abnormal uterine and vaginal bleeding, unspecified: Secondary | ICD-10-CM

## 2023-01-15 LAB — TYPE AND SCREEN: ABO/RH(D): A NEG

## 2023-01-15 LAB — BPAM RBC: Blood Product Expiration Date: 202407182359

## 2023-01-15 MED ORDER — HEPARIN SOD (PORK) LOCK FLUSH 100 UNIT/ML IV SOLN
500.0000 [IU] | Freq: Once | INTRAVENOUS | Status: AC
  Administered 2023-01-15: 500 [IU]

## 2023-01-15 MED ORDER — DIPHENHYDRAMINE HCL 25 MG PO CAPS
25.0000 mg | ORAL_CAPSULE | Freq: Once | ORAL | Status: AC
  Administered 2023-01-15: 25 mg via ORAL
  Filled 2023-01-15: qty 1

## 2023-01-15 MED ORDER — SODIUM CHLORIDE 0.9% FLUSH
10.0000 mL | Freq: Once | INTRAVENOUS | Status: AC
  Administered 2023-01-15: 10 mL

## 2023-01-15 MED ORDER — ACETAMINOPHEN 325 MG PO TABS
650.0000 mg | ORAL_TABLET | Freq: Once | ORAL | Status: AC
  Administered 2023-01-15: 650 mg via ORAL
  Filled 2023-01-15: qty 2

## 2023-01-15 MED ORDER — SODIUM CHLORIDE 0.9% IV SOLUTION
250.0000 mL | Freq: Once | INTRAVENOUS | Status: AC
  Administered 2023-01-15: 250 mL via INTRAVENOUS

## 2023-01-15 NOTE — Patient Instructions (Signed)
Blood Transfusion, Adult A blood transfusion is a procedure in which you receive blood or a type of blood cell (blood component) through an IV. You may need a blood transfusion when you have a low blood count, which is a low number of any blood cell. This may result from a bleeding disorder, illness, injury, or surgery. The blood may come from a donor, or you may be able to have your own blood collected and stored (autologous blood donation) before a planned surgery. The blood given in a transfusion may be made up of different blood components. You may receive: Red blood cells. These carry oxygen to the cells in the body. Platelets. These help your blood to clot. Plasma. This is the liquid part of your blood. It carries proteins and other substances throughout the body. White blood cells. These help you fight infections. If you have hemophilia or another clotting disorder, you may also receive other types of blood products. Depending on the type of blood product, this procedure may take 1-4 hours to complete. Tell a health care provider about: Any bleeding problems you have. Any previous reactions you have had during a blood transfusion. Any allergies you have. All medicines you are taking, including vitamins, herbs, eye drops, creams, and over-the-counter medicines. Any surgeries you have had. Any medical conditions you have. Whether you are pregnant or may be pregnant. What are the risks? Talk with your health care provider about risks. The most common problems include: A mild allergic reaction, such as red, swollen areas of skin (hives) and itching. Fever or chills. This may be the body's response to new blood cells received. This may occur during or up to 4 hours after the transfusion. More serious problems may include: A serious allergic reaction that causes difficulty breathing or swelling around the face and lips. Transfusion-associated circulatory overload (TACO), or too much fluid in  the lungs. This may cause breathing problems. Transfusion-related acute lung injury (TRALI), which causes breathing difficulty and low oxygen in the blood. This can occur within hours of the transfusion or several days later. Iron overload. This can happen after receiving many blood transfusions over a period of time. Infection or virus being transmitted. This is rare because donated blood is carefully tested before it is given. Hemolytic transfusion reaction. This is rare. It happens when the body's defense system (immune system)tries to attack the new blood cells. Symptoms may include fever, chills, nausea, low blood pressure, and low back or chest pain. Transfusion-associated graft-versus-host disease (TAGVHD). This is rare. It happens when donated cells attack the body's healthy tissues. What happens before the procedure? You will have a blood test to check your blood type. This test is done to know what kind of blood your body will accept and to match it to the donor blood. If you are going to have a planned surgery, you may be able to do an autologous blood donation. This may be done in case you need to have a transfusion. You will have your temperature, blood pressure, and pulse checked before the transfusion. If you have had an allergic reaction to a transfusion in the past, you may be given medicine to help prevent a reaction. This medicine may be given to you by mouth (orally) or through an IV. What happens during the procedure?  An IV will be inserted into one of your veins. The bag of blood will be attached to your IV. The blood will then enter through your vein. Your temperature, blood pressure,   and pulse will be monitored during the transfusion. This monitoring is done to detect early signs of a transfusion reaction. Tell your nurse right away if you have any of these symptoms during the transfusion: Shortness of breath or trouble breathing. Chest or back pain. Fever or  chills. Itching or hives. If you have any signs or symptoms of a reaction, your transfusion will be stopped and you may be given medicine. When the transfusion is complete, your IV will be removed. Pressure may be applied to the IV site for a few minutes. A bandage (dressing)will be applied. The procedure may vary among health care providers and hospitals. What happens after the procedure? Your temperature, blood pressure, pulse, breathing rate, and blood oxygen level will be monitored until you leave the hospital or clinic. Your blood may be tested to see how you have responded to the transfusion. You may be warmed with fluids or blankets to maintain a normal body temperature. If you receive your blood transfusion in an outpatient setting, you will be told whom to contact to report any reactions. Where to find more information Visit the [employer]: redcross.org Summary A blood transfusion is a procedure in which you receive blood or a type of blood cell (blood component) through an IV. The blood given in a transfusion may be made up of different blood components. You may receive red blood cells, platelets, plasma, or white blood cells depending on the condition treated. Your temperature, blood pressure, and pulse will be monitored before, during, and after the transfusion. After the transfusion, your blood may be tested to see how your body has responded. This information is not intended to replace advice given to you by your health care provider. Make sure you discuss any questions you have with your health care provider. Document Revised: 09/25/2021 Document Reviewed: 09/25/2021 Elsevier Patient Education  2024 Elsevier Inc.   

## 2023-01-17 LAB — BPAM RBC
ISSUE DATE / TIME: 202407060853
Unit Type and Rh: 600

## 2023-01-17 LAB — TYPE AND SCREEN: Unit division: 0

## 2023-01-18 DIAGNOSIS — Z510 Encounter for antineoplastic radiation therapy: Secondary | ICD-10-CM | POA: Diagnosis not present

## 2023-01-19 LAB — TYPE AND SCREEN: Antibody Screen: NEGATIVE

## 2023-01-19 LAB — BPAM RBC: Unit Type and Rh: 600

## 2023-01-19 MED FILL — Dexamethasone Sodium Phosphate Inj 100 MG/10ML: INTRAMUSCULAR | Qty: 1 | Status: AC

## 2023-01-19 MED FILL — Fosaprepitant Dimeglumine For IV Infusion 150 MG (Base Eq): INTRAVENOUS | Qty: 5 | Status: AC

## 2023-01-20 ENCOUNTER — Telehealth: Payer: Self-pay

## 2023-01-20 ENCOUNTER — Ambulatory Visit
Admission: RE | Admit: 2023-01-20 | Discharge: 2023-01-20 | Disposition: A | Discharge status: Home / Self Care | Payer: BC Managed Care – PPO | Source: Ambulatory Visit | Attending: Radiation Oncology | Admitting: Radiation Oncology

## 2023-01-20 ENCOUNTER — Other Ambulatory Visit: Payer: Self-pay

## 2023-01-20 ENCOUNTER — Encounter: Payer: Self-pay | Admitting: Hematology and Oncology

## 2023-01-20 ENCOUNTER — Inpatient Hospital Stay: Payer: BC Managed Care – PPO

## 2023-01-20 VITALS — BP 122/82 | HR 92 | Temp 98.9°F | Resp 16 | Wt 154.8 lb

## 2023-01-20 DIAGNOSIS — Z510 Encounter for antineoplastic radiation therapy: Secondary | ICD-10-CM | POA: Diagnosis not present

## 2023-01-20 DIAGNOSIS — N939 Abnormal uterine and vaginal bleeding, unspecified: Secondary | ICD-10-CM

## 2023-01-20 DIAGNOSIS — C531 Malignant neoplasm of exocervix: Secondary | ICD-10-CM

## 2023-01-20 DIAGNOSIS — D508 Other iron deficiency anemias: Secondary | ICD-10-CM

## 2023-01-20 LAB — RAD ONC ARIA SESSION SUMMARY
Course Elapsed Days: 0
Plan Fractions Treated to Date: 1
Plan Prescribed Dose Per Fraction: 1.8 Gy
Plan Total Fractions Prescribed: 25
Plan Total Prescribed Dose: 45 Gy
Reference Point Dosage Given to Date: 1.8 Gy
Reference Point Session Dosage Given: 1.8 Gy
Session Number: 1

## 2023-01-20 MED ORDER — SODIUM CHLORIDE 0.9 % IV SOLN
200.0000 mg | Freq: Once | INTRAVENOUS | Status: AC
  Administered 2023-01-20: 200 mg via INTRAVENOUS
  Filled 2023-01-20: qty 200

## 2023-01-20 MED ORDER — SODIUM CHLORIDE 0.9 % IV SOLN: Freq: Once | INTRAVENOUS | Status: AC

## 2023-01-20 MED ORDER — HEPARIN SOD (PORK) LOCK FLUSH 100 UNIT/ML IV SOLN
500.0000 [IU] | Freq: Once | INTRAVENOUS | Status: AC | PRN
  Administered 2023-01-20: 500 [IU]

## 2023-01-20 MED ORDER — MAGNESIUM SULFATE 2 GM/50ML IV SOLN
2.0000 g | Freq: Once | INTRAVENOUS | Status: AC
  Administered 2023-01-20: 2 g via INTRAVENOUS
  Filled 2023-01-20: qty 50

## 2023-01-20 MED ORDER — PALONOSETRON HCL INJECTION 0.25 MG/5ML
0.2500 mg | Freq: Once | INTRAVENOUS | Status: AC
  Administered 2023-01-20: 0.25 mg via INTRAVENOUS
  Filled 2023-01-20: qty 5

## 2023-01-20 MED ORDER — SODIUM CHLORIDE 0.9 % IV SOLN
150.0000 mg | Freq: Once | INTRAVENOUS | Status: AC
  Administered 2023-01-20: 150 mg via INTRAVENOUS
  Filled 2023-01-20: qty 150

## 2023-01-20 MED ORDER — POTASSIUM CHLORIDE IN NACL 20-0.9 MEQ/L-% IV SOLN
Freq: Once | INTRAVENOUS | Status: AC
  Filled 2023-01-20: qty 1000

## 2023-01-20 MED ORDER — SODIUM CHLORIDE 0.9% FLUSH
10.0000 mL | INTRAVENOUS | Status: DC | PRN
  Administered 2023-01-20: 10 mL

## 2023-01-20 MED ORDER — SODIUM CHLORIDE 0.9 % IV SOLN
40.0000 mg/m2 | Freq: Once | INTRAVENOUS | Status: AC
  Administered 2023-01-20: 71 mg via INTRAVENOUS
  Filled 2023-01-20: qty 71

## 2023-01-20 MED ORDER — SODIUM CHLORIDE 0.9 % IV SOLN
10.0000 mg | Freq: Once | INTRAVENOUS | Status: AC
  Administered 2023-01-20: 10 mg via INTRAVENOUS
  Filled 2023-01-20: qty 10

## 2023-01-20 NOTE — Patient Instructions (Signed)
[facility]  Discharge Instructions: Thank you for choosing [facility] to provide your oncology and hematology care.   If you have a lab appointment with the Cancer Center, please go directly to the Cancer Center and check in at the registration area.   Wear comfortable clothing and clothing appropriate for easy access to any Portacath or PICC line.   We strive to give you quality time with your provider. You may need to reschedule your appointment if you arrive late (15 or more minutes).  Arriving late affects you and other patients whose appointments are after yours.  Also, if you miss three or more appointments without notifying the office, you may be dismissed from the clinic at the provider's discretion.      For prescription refill requests, have your pharmacy contact our office and allow 72 hours for refills to be completed.    Today you received the following chemotherapy and/or immunotherapy agents Keytruda, Cisplatin, Venofer.      To help prevent nausea and vomiting after your treatment, we encourage you to take your nausea medication as directed.  BELOW ARE SYMPTOMS THAT SHOULD BE REPORTED IMMEDIATELY: *FEVER GREATER THAN 100.4 F (38 C) OR HIGHER *CHILLS OR SWEATING *NAUSEA AND VOMITING THAT IS NOT CONTROLLED WITH YOUR NAUSEA MEDICATION *UNUSUAL SHORTNESS OF BREATH *UNUSUAL BRUISING OR BLEEDING *URINARY PROBLEMS (pain or burning when urinating, or frequent urination) *BOWEL PROBLEMS (unusual diarrhea, constipation, pain near the anus) TENDERNESS IN MOUTH AND THROAT WITH OR WITHOUT PRESENCE OF ULCERS (sore throat, sores in mouth, or a toothache) UNUSUAL RASH, SWELLING OR PAIN  UNUSUAL VAGINAL DISCHARGE OR ITCHING   Items with * indicate a potential emergency and should be followed up as soon as possible or go to the Emergency Department if any problems should occur.  Please show the CHEMOTHERAPY ALERT CARD or IMMUNOTHERAPY  ALERT CARD at check-in to the Emergency Department and triage nurse.  Should you have questions after your visit or need to cancel or reschedule your appointment, please contact [facility]  Dept: [phone]  and follow the prompts.  Office hours are 8:00 a.m. to 4:30 p.m. Monday - Friday. Please note that voicemails left after 4:00 p.m. may not be returned until the following business day.  We are closed weekends and major holidays. You have access to a nurse at all times for urgent questions. Please call the main number to the clinic Dept: [phone] and follow the prompts.   For any non-urgent questions, you may also contact your provider using MyChart. We now offer e-Visits for anyone 51 and older to request care online for non-urgent symptoms. For details visit [email or web address].   Also download the MyChart app! Go to the app store, search "MyChart", open the app, select [facility], and log in with your MyChart username and password.

## 2023-01-20 NOTE — Progress Notes (Signed)
Per Dr Bertis Ruddy, ok to run post-hydration fluids concurrently with Cisplatin for this tx.

## 2023-01-20 NOTE — Telephone Encounter (Signed)
Notified Patient of completion of FMLA forms for Spouse as requested. Fax transmission confirmation received.Copy of forms placed for pick-up as requested. No other needs or concerns voiced at this time.

## 2023-01-21 ENCOUNTER — Inpatient Hospital Stay: Payer: BC Managed Care – PPO | Admitting: Hematology and Oncology

## 2023-01-21 ENCOUNTER — Other Ambulatory Visit: Payer: Self-pay

## 2023-01-21 ENCOUNTER — Other Ambulatory Visit: Payer: Self-pay | Admitting: Hematology and Oncology

## 2023-01-21 ENCOUNTER — Inpatient Hospital Stay: Payer: BC Managed Care – PPO

## 2023-01-21 ENCOUNTER — Encounter: Payer: Self-pay | Admitting: Hematology and Oncology

## 2023-01-21 ENCOUNTER — Ambulatory Visit: Admission: RE | Admit: 2023-01-21 | Payer: BC Managed Care – PPO | Source: Ambulatory Visit

## 2023-01-21 VITALS — BP 120/70 | HR 77 | Temp 97.9°F | Resp 18 | Ht 64.0 in | Wt 157.8 lb

## 2023-01-21 DIAGNOSIS — D508 Other iron deficiency anemias: Secondary | ICD-10-CM

## 2023-01-21 DIAGNOSIS — C531 Malignant neoplasm of exocervix: Secondary | ICD-10-CM

## 2023-01-21 DIAGNOSIS — Z510 Encounter for antineoplastic radiation therapy: Secondary | ICD-10-CM | POA: Diagnosis not present

## 2023-01-21 DIAGNOSIS — N939 Abnormal uterine and vaginal bleeding, unspecified: Secondary | ICD-10-CM

## 2023-01-21 DIAGNOSIS — G893 Neoplasm related pain (acute) (chronic): Secondary | ICD-10-CM

## 2023-01-21 DIAGNOSIS — C539 Malignant neoplasm of cervix uteri, unspecified: Secondary | ICD-10-CM

## 2023-01-21 LAB — COMPREHENSIVE METABOLIC PANEL
ALT: <5 U/L (ref 0–44)
AST: 8 U/L — ABNORMAL LOW (ref 15–41)
Albumin: 3.5 g/dL (ref 3.5–5.0)
Alkaline Phosphatase: 71 U/L (ref 38–126)
Anion gap: 6 (ref 5–15)
BUN: 9 mg/dL (ref 6–20)
CO2: 25 mmol/L (ref 22–32)
Calcium: 9.5 mg/dL (ref 8.9–10.3)
Chloride: 110 mmol/L (ref 98–111)
Creatinine, Ser: 0.59 mg/dL (ref 0.44–1.00)
GFR, Estimated: >60 mL/min (ref >60)
Glucose, Bld: 101 mg/dL — ABNORMAL HIGH (ref 70–99)
Potassium: 3.7 mmol/L (ref 3.5–5.1)
Sodium: 141 mmol/L (ref 135–145)
Total Bilirubin: 0.3 mg/dL (ref 0.3–1.2)
Total Protein: 6.6 g/dL (ref 6.5–8.1)

## 2023-01-21 LAB — RAD ONC ARIA SESSION SUMMARY
Course Elapsed Days: 1
Plan Fractions Treated to Date: 2
Plan Prescribed Dose Per Fraction: 1.8 Gy
Plan Total Fractions Prescribed: 25
Plan Total Prescribed Dose: 45 Gy
Reference Point Dosage Given to Date: 3.6 Gy
Reference Point Session Dosage Given: 1.8 Gy
Session Number: 2

## 2023-01-21 LAB — CBC WITH DIFFERENTIAL/PLATELET
Abs Immature Granulocytes: 0.03 10*3/uL (ref 0.00–0.07)
Basophils Absolute: 0 10*3/uL (ref 0.0–0.1)
Basophils Relative: 0 %
Eosinophils Absolute: 0 10*3/uL (ref 0.0–0.5)
Eosinophils Relative: 0 %
HCT: 29.6 % — ABNORMAL LOW (ref 36.0–46.0)
Hemoglobin: 9 g/dL — ABNORMAL LOW (ref 12.0–15.0)
Immature Granulocytes: 0 %
Lymphocytes Relative: 7 %
Lymphs Abs: 0.9 10*3/uL (ref 0.7–4.0)
MCH: 28.8 pg (ref 26.0–34.0)
MCHC: 30.4 g/dL (ref 30.0–36.0)
MCV: 94.9 fL (ref 80.0–100.0)
Monocytes Absolute: 0.7 10*3/uL (ref 0.1–1.0)
Monocytes Relative: 5 %
Neutro Abs: 10.7 10*3/uL — ABNORMAL HIGH (ref 1.7–7.7)
Neutrophils Relative %: 88 %
Platelets: 618 10*3/uL — ABNORMAL HIGH (ref 150–400)
RBC: 3.12 MIL/uL — ABNORMAL LOW (ref 3.87–5.11)
RDW: 15.9 % — ABNORMAL HIGH (ref 11.5–15.5)
WBC: 12.3 10*3/uL — ABNORMAL HIGH (ref 4.0–10.5)
nRBC: 0 % (ref 0.0–0.2)

## 2023-01-21 LAB — MAGNESIUM: Magnesium: 2.2 mg/dL (ref 1.7–2.4)

## 2023-01-21 MED ORDER — SODIUM CHLORIDE 0.9% FLUSH
10.0000 mL | Freq: Once | INTRAVENOUS | Status: AC | PRN
  Administered 2023-01-21: 10 mL

## 2023-01-21 MED ORDER — HEPARIN SOD (PORK) LOCK FLUSH 100 UNIT/ML IV SOLN
500.0000 [IU] | Freq: Once | INTRAVENOUS | Status: AC | PRN
  Administered 2023-01-21: 500 [IU]

## 2023-01-21 NOTE — Assessment & Plan Note (Signed)
She has started on treatment Her vaginal bleeding is light I will see her next week for further follow-up

## 2023-01-21 NOTE — Assessment & Plan Note (Signed)
She has suboptimal pain control with oxycodone I recommend the patient to increase oxycodone along with acetaminophen I will assess pain control next week

## 2023-01-21 NOTE — Progress Notes (Signed)
[facility] OFFICE PROGRESS NOTE  Patient Care Team: Patient, No Pcp Per as PCP - General (General Practice)  ASSESSMENT & PLAN:  Cervical cancer (HCC) She has started on treatment Her vaginal bleeding is light I will see her next week for further follow-up  Iron deficiency anemia This is improved with blood and iron transfusion She does not need further transfusion right now Observe closely  Cancer associated pain She has suboptimal pain control with oxycodone I recommend the patient to increase oxycodone along with acetaminophen I will assess pain control next week  No orders of the defined types were placed in this encounter.   All questions were answered. The patient knows to call the clinic with any problems, questions or concerns. The total time spent in the appointment was 25 minutes encounter with patients including review of chart and various tests results, discussions about plan of care and coordination of care plan   Artis Delay, MD 01/21/2023 3:31 PM  INTERVAL HISTORY: Please see below for problem oriented charting. she returns for treatment follow-up She tolerated chemotherapy well Her vaginal bleeding is less She continues to have pelvic pain Denies nausea or constipation  REVIEW OF SYSTEMS:   Constitutional: Denies fevers, chills or abnormal weight loss Eyes: Denies blurriness of vision Ears, nose, mouth, throat, and face: Denies mucositis or sore throat Respiratory: Denies cough, dyspnea or wheezes Cardiovascular: Denies palpitation, chest discomfort or lower extremity swelling Gastrointestinal:  Denies nausea, heartburn or change in bowel habits Skin: Denies abnormal skin rashes Lymphatics: Denies new lymphadenopathy or easy bruising Neurological:Denies numbness, tingling or new weaknesses Behavioral/Psych: Mood is stable, no new changes  All other systems were reviewed with the patient and are negative.  I have reviewed the past  medical history, past surgical history, social history and family history with the patient and they are unchanged from previous note.  ALLERGIES:  has No Known Allergies.  MEDICATIONS:  Current Outpatient Medications  Medication Sig Dispense Refill   lidocaine-prilocaine (EMLA) cream Apply 1 Application topically as needed. Apply to port site 1 hour to 30 minutes before port is accessed 30 g 1   ondansetron (ZOFRAN) 8 MG tablet Take 1 tablet (8 mg total) by mouth every 8 (eight) hours as needed for nausea or vomiting. Start on the third day after cisplatin. 30 tablet 1   oxyCODONE (OXY IR/ROXICODONE) 5 MG immediate release tablet Take 1 tablet (5 mg total) by mouth every 4 (four) hours as needed for severe pain. 30 tablet 0   prochlorperazine (COMPAZINE) 10 MG tablet TAKE 1 TABLET(10 MG) BY MOUTH EVERY 6 HOURS AS NEEDED FOR NAUSEA OR VOMITING 30 tablet 1   No current facility-administered medications for this visit.    SUMMARY OF ONCOLOGIC HISTORY: Oncology History Overview Note  PD-L1 CPS 1%   Cervical cancer (HCC)  11/24/2022 Initial Diagnosis   The patient presented recently to the emergency department with both vaginal bleeding and rectal bleeding.  Exam in the emergency department showed a 6 cm irregular cervical mass.     11/24/2022 Imaging   CT pelvis 1. Heterogeneous cervical mass is highly worrisome for cervical carcinoma. Significant border of contact with the adjacent rectum. Difficult to exclude invasion. 2. Locule of air within the cervical mass may be due to necrosis or extension from the vagina. Difficult to exclude a fistula to the rectum.   11/24/2022 Imaging   US pelvis 1. Large, approximately 7.6 cm vascular soft tissue mass of the lower uterine segment. This  seems inseparable from the cervix and Cervical Carcinoma is not excluded. Alternatively this might be a large lower uterine fibroid. Recommend direct visualization of the cervix.   2. Elsewhere the uterus and  endometrium are within normal limits. Normal ovaries and no free fluid.   11/26/2022 Pathology Results   SURGICAL PATHOLOGY  CASE: [id number]  PATIENT: Lorraine Yates  Surgical Pathology Report   FINAL MICROSCOPIC DIAGNOSIS:   A. 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  See comment.   COMMENT:   - Morphologic evaluation and p16 immunohistochemical stain reveal superficial squamous mucosa with findings consistent with high-grade squamous intraepithelial lesion.  The patient's clinical history of a cervical mass is noted, and a deeper more invasive process cannot be excluded.  Clinical and imaging correlation is recommended.      11/29/2022 Initial Diagnosis   Cervical cancer (HCC)   12/02/2022 Cancer Staging   Staging form: Cervix Uteri, AJCC Version 9 - Clinical stage from 12/02/2022: Stage IIIC1 (cT1b3, cN1, cM0) - Signed by Artis Delay, MD on 12/15/2022 Stage prefix: Initial diagnosis   12/15/2022 PET scan   1. Hypermetabolic cervical mass consistent with known cervical carcinoma. 2. There is a mildly FDG avid left pelvic sidewall lymph node which is equivocal for nodal metastasis. 3. No additional sites of disease identified. 4. Aortic Atherosclerosis (ICD10-I70.0).     12/16/2022 Procedure   Successful placement of a right internal jugular approach power injectable Port-A-Cath. The catheter is ready for immediate use.     12/17/2022 Pathology Results   SURGICAL PATHOLOGY  CASE: [id number]  PATIENT: Lorraine Yates  Surgical Pathology Report   Clinical History: malignant neoplasm of cervix, unspecified site   FINAL MICROSCOPIC DIAGNOSIS:   A. 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     12/18/2022 Imaging   1. The normal cervical anatomy is completely effaced by a bulky mass measuring 7.9 x 6.9 x  5.5 cm. Mass extends into the lower uterine segment with loss of parametrial definition about the posterior 180 degrees of the upper portion of the mass. Mass remains confined to the upper third of the vagina. 2. Preserved fat planes to the adjacent bladder and rectum. No ureteral invasion or hydronephrosis. 3. Small left pelvic sidewall lymph node measuring 0.9 x 0.6 cm, previously with equivocal FDG avidity and suspicious for a small nodal metastasis. No other enlarged lymph nodes in the pelvis.     01/20/2023 - 01/20/2023 Chemotherapy   Patient is on Treatment Plan : HEAD/NECK Cisplatin (40) q7d     01/20/2023 -  Chemotherapy   Patient is on Treatment Plan : Cervical Pembrolizumab (200) q21d, cisplatin (40) q7d + XRT / pembrolizumab (400) q42d        PHYSICAL EXAMINATION: ECOG PERFORMANCE STATUS: 1 - Symptomatic but completely ambulatory  Vitals:   01/21/23 1505  BP: 120/70  Pulse: 77  Resp: 18  Temp: 97.9 F (36.6 C)  SpO2: 100%   Filed Weights   01/21/23 1505  Weight: 157 lb 12.8 oz (71.6 kg)    GENERAL:alert, no distress and comfortable NEURO: alert & oriented x 3 with fluent speech, no focal motor/sensory deficits  LABORATORY DATA:  I have reviewed the data as listed    Component Value Date/Time   NA 141 01/21/2023 1407   K 3.7 01/21/2023 1407   CL 110 01/21/2023 1407   CO2  25 01/21/2023 1407   GLUCOSE 101 (H) 01/21/2023 1407   BUN 9 01/21/2023 1407   CREATININE 0.59 01/21/2023 1407   CALCIUM 9.5 01/21/2023 1407   PROT 6.6 01/21/2023 1407   ALBUMIN 3.5 01/21/2023 1407   AST 8 (L) 01/21/2023 1407   ALT <5 01/21/2023 1407   ALKPHOS 71 01/21/2023 1407   BILITOT 0.3 01/21/2023 1407   GFRNONAA >60 01/21/2023 1407    No results found for: "SPEP", "UPEP"  Lab Results  Component Value Date   WBC 12.3 (H) 01/21/2023   NEUTROABS 10.7 (H) 01/21/2023   HGB 9.0 (L) 01/21/2023   HCT 29.6 (L) 01/21/2023   MCV 94.9 01/21/2023   PLT 618 (H) 01/21/2023       Chemistry      Component Value Date/Time   NA 141 01/21/2023 1407   K 3.7 01/21/2023 1407   CL 110 01/21/2023 1407   CO2 25 01/21/2023 1407   BUN 9 01/21/2023 1407   CREATININE 0.59 01/21/2023 1407      Component Value Date/Time   CALCIUM 9.5 01/21/2023 1407   ALKPHOS 71 01/21/2023 1407   AST 8 (L) 01/21/2023 1407   ALT <5 01/21/2023 1407   BILITOT 0.3 01/21/2023 1407

## 2023-01-21 NOTE — Assessment & Plan Note (Signed)
This is improved with blood and iron transfusion She does not need further transfusion right now Observe closely

## 2023-01-24 ENCOUNTER — Ambulatory Visit
Admission: RE | Admit: 2023-01-24 | Discharge: 2023-01-24 | Disposition: A | Discharge status: Home / Self Care | Payer: BC Managed Care – PPO | Source: Ambulatory Visit | Attending: Radiation Oncology | Admitting: Radiation Oncology

## 2023-01-24 ENCOUNTER — Encounter: Payer: Self-pay | Admitting: Hematology and Oncology

## 2023-01-24 ENCOUNTER — Inpatient Hospital Stay: Payer: BC Managed Care – PPO

## 2023-01-24 ENCOUNTER — Other Ambulatory Visit: Payer: Self-pay

## 2023-01-24 DIAGNOSIS — Z510 Encounter for antineoplastic radiation therapy: Secondary | ICD-10-CM | POA: Diagnosis not present

## 2023-01-24 LAB — RAD ONC ARIA SESSION SUMMARY
Course Elapsed Days: 4
Plan Fractions Treated to Date: 3
Plan Prescribed Dose Per Fraction: 1.8 Gy
Plan Total Fractions Prescribed: 25
Plan Total Prescribed Dose: 45 Gy
Reference Point Dosage Given to Date: 5.4 Gy
Reference Point Session Dosage Given: 1.8 Gy
Session Number: 3

## 2023-01-24 NOTE — Telephone Encounter (Signed)
Left message on pt's identified vm to call back to let us know how she is doing post chemo.  Requested she talk with Dr Bertis Ruddy RN.

## 2023-01-24 NOTE — Telephone Encounter (Signed)
-----   Message from Nurse Lanora Manis T sent at 01/21/2023  1:59 PM EDT ----- Regarding: Dr Bertis Ruddy pt 1st time Cisplatin Dr Bertis Ruddy pt received 1st time Keytruda, Cisplatin, and iron yesterday. Pt tolerated tx well w/o incident. Pt due for callback.

## 2023-01-25 ENCOUNTER — Ambulatory Visit
Admission: RE | Admit: 2023-01-25 | Discharge: 2023-01-25 | Disposition: A | Discharge status: Home / Self Care | Payer: BC Managed Care – PPO | Source: Ambulatory Visit | Attending: Radiation Oncology | Admitting: Radiation Oncology

## 2023-01-25 ENCOUNTER — Inpatient Hospital Stay: Payer: BC Managed Care – PPO | Admitting: Hematology and Oncology

## 2023-01-25 ENCOUNTER — Encounter: Payer: Self-pay | Admitting: Hematology and Oncology

## 2023-01-25 ENCOUNTER — Other Ambulatory Visit: Payer: Self-pay

## 2023-01-25 ENCOUNTER — Other Ambulatory Visit (HOSPITAL_COMMUNITY): Payer: Self-pay

## 2023-01-25 VITALS — BP 110/68 | HR 89 | Temp 99.4°F | Resp 18 | Ht 64.0 in | Wt 154.4 lb

## 2023-01-25 DIAGNOSIS — G893 Neoplasm related pain (acute) (chronic): Secondary | ICD-10-CM | POA: Diagnosis not present

## 2023-01-25 DIAGNOSIS — K5909 Other constipation: Secondary | ICD-10-CM | POA: Diagnosis not present

## 2023-01-25 DIAGNOSIS — C531 Malignant neoplasm of exocervix: Secondary | ICD-10-CM

## 2023-01-25 DIAGNOSIS — D508 Other iron deficiency anemias: Secondary | ICD-10-CM

## 2023-01-25 DIAGNOSIS — Z510 Encounter for antineoplastic radiation therapy: Secondary | ICD-10-CM | POA: Diagnosis not present

## 2023-01-25 LAB — RAD ONC ARIA SESSION SUMMARY
Course Elapsed Days: 5
Plan Fractions Treated to Date: 4
Plan Prescribed Dose Per Fraction: 1.8 Gy
Plan Total Fractions Prescribed: 25
Plan Total Prescribed Dose: 45 Gy
Reference Point Dosage Given to Date: 7.2 Gy
Reference Point Session Dosage Given: 1.8 Gy
Session Number: 4

## 2023-01-25 MED ORDER — OXYCODONE HCL 5 MG PO TABS
5.0000 mg | ORAL_TABLET | ORAL | 0 refills | Status: DC | PRN
  Filled 2023-01-25: qty 60, 10d supply, fill #0

## 2023-01-26 ENCOUNTER — Ambulatory Visit
Admission: RE | Admit: 2023-01-26 | Discharge: 2023-01-26 | Disposition: A | Discharge status: Home / Self Care | Payer: BC Managed Care – PPO | Source: Ambulatory Visit | Attending: Radiation Oncology | Admitting: Radiation Oncology

## 2023-01-26 ENCOUNTER — Other Ambulatory Visit: Payer: Self-pay

## 2023-01-26 DIAGNOSIS — Z510 Encounter for antineoplastic radiation therapy: Secondary | ICD-10-CM | POA: Diagnosis not present

## 2023-01-26 DIAGNOSIS — K5909 Other constipation: Secondary | ICD-10-CM | POA: Insufficient documentation

## 2023-01-26 LAB — RAD ONC ARIA SESSION SUMMARY
Course Elapsed Days: 6
Plan Fractions Treated to Date: 5
Plan Prescribed Dose Per Fraction: 1.8 Gy
Plan Total Fractions Prescribed: 25
Plan Total Prescribed Dose: 45 Gy
Reference Point Dosage Given to Date: 9 Gy
Reference Point Session Dosage Given: 1.8 Gy
Session Number: 5

## 2023-01-26 MED FILL — Fosaprepitant Dimeglumine For IV Infusion 150 MG (Base Eq): INTRAVENOUS | Qty: 5 | Status: AC

## 2023-01-26 MED FILL — Dexamethasone Sodium Phosphate Inj 100 MG/10ML: INTRAMUSCULAR | Qty: 1 | Status: AC

## 2023-01-26 NOTE — Assessment & Plan Note (Signed)
We discussed importance of aggressive laxative therapy while on pain medicine. I recommend she takes MiraLAX and Senokot daily

## 2023-01-26 NOTE — Assessment & Plan Note (Signed)
Her pain control has been reasonable with current prescribed pain medicine.  I refilled her prescription today.  We discussed narcotic refill policy

## 2023-01-26 NOTE — Assessment & Plan Note (Signed)
This is improved with blood and iron transfusion Observe closely

## 2023-01-26 NOTE — Progress Notes (Signed)
[facility] OFFICE PROGRESS NOTE  Patient Care Team: Patient, No Pcp Per as PCP - General (General Practice)  ASSESSMENT & PLAN:  Cervical cancer (HCC) She tolerated cycle 1 of chemotherapy well without major complications. She has minimal vaginal bleeding.  I will see her weekly prior to chemotherapy as scheduled.  Cancer associated pain Her pain control has been reasonable with current prescribed pain medicine.  I refilled her prescription today.  We discussed narcotic refill policy  Iron deficiency anemia This is improved with blood and iron transfusion Observe closely  Other constipation We discussed importance of aggressive laxative therapy while on pain medicine. I recommend she takes MiraLAX and Senokot daily  No orders of the defined types were placed in this encounter.   All questions were answered. The patient knows to call the clinic with any problems, questions or concerns. The total time spent in the appointment was 30 minutes encounter with patients including review of chart and various tests results, discussions about plan of care and coordination of care plan   Artis Delay, MD 01/26/2023 3:00 PM  INTERVAL HISTORY: Please see below for problem oriented charting. she returns for treatment follow-up with her husband Overall, she tolerated last cycle therapy well Her vaginal bleeding is now minimum Her pain control has improved with pain medicine She is constipated.  She is taking intermittent doses of laxatives without success.  She denies nausea  REVIEW OF SYSTEMS:   Constitutional: Denies fevers, chills or abnormal weight loss Eyes: Denies blurriness of vision Ears, nose, mouth, throat, and face: Denies mucositis or sore throat Respiratory: Denies cough, dyspnea or wheezes Cardiovascular: Denies palpitation, chest discomfort or lower extremity swelling Skin: Denies abnormal skin rashes Lymphatics: Denies new lymphadenopathy or easy  bruising Neurological:Denies numbness, tingling or new weaknesses Behavioral/Psych: Mood is stable, no new changes  All other systems were reviewed with the patient and are negative.  I have reviewed the past medical history, past surgical history, social history and family history with the patient and they are unchanged from previous note.  ALLERGIES:  has No Known Allergies.  MEDICATIONS:  Current Outpatient Medications  Medication Sig Dispense Refill   lidocaine-prilocaine (EMLA) cream Apply 1 Application topically as needed. Apply to port site 1 hour to 30 minutes before port is accessed 30 g 1   ondansetron (ZOFRAN) 8 MG tablet Take 1 tablet (8 mg total) by mouth every 8 (eight) hours as needed for nausea or vomiting. Start on the third day after cisplatin. 30 tablet 1   oxyCODONE (OXY IR/ROXICODONE) 5 MG immediate release tablet Take 1 tablet (5 mg total) by mouth every 4 (four) hours as needed for severe pain. 60 tablet 0   prochlorperazine (COMPAZINE) 10 MG tablet TAKE 1 TABLET(10 MG) BY MOUTH EVERY 6 HOURS AS NEEDED FOR NAUSEA OR VOMITING 30 tablet 1   No current facility-administered medications for this visit.    SUMMARY OF ONCOLOGIC HISTORY: Oncology History Overview Note  PD-L1 CPS 1%   Cervical cancer (HCC)  11/24/2022 Initial Diagnosis   The patient presented recently to the emergency department with both vaginal bleeding and rectal bleeding.  Exam in the emergency department showed a 6 cm irregular cervical mass.     11/24/2022 Imaging   CT pelvis 1. Heterogeneous cervical mass is highly worrisome for cervical carcinoma. Significant border of contact with the adjacent rectum. Difficult to exclude invasion. 2. Locule of air within the cervical mass may be due to necrosis or extension from the  vagina. Difficult to exclude a fistula to the rectum.   11/24/2022 Imaging   US pelvis 1. Large, approximately 7.6 cm vascular soft tissue mass of the lower uterine segment. This  seems inseparable from the cervix and Cervical Carcinoma is not excluded. Alternatively this might be a large lower uterine fibroid. Recommend direct visualization of the cervix.   2. Elsewhere the uterus and endometrium are within normal limits. Normal ovaries and no free fluid.   11/26/2022 Pathology Results   SURGICAL PATHOLOGY  CASE: [id number]  PATIENT: Lorraine Yates  Surgical Pathology Report   FINAL MICROSCOPIC DIAGNOSIS:   A. 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  See comment.   COMMENT:   - Morphologic evaluation and p16 immunohistochemical stain reveal superficial squamous mucosa with findings consistent with high-grade squamous intraepithelial lesion.  The patient's clinical history of a cervical mass is noted, and a deeper more invasive process cannot be excluded.  Clinical and imaging correlation is recommended.      11/29/2022 Initial Diagnosis   Cervical cancer (HCC)   12/02/2022 Cancer Staging   Staging form: Cervix Uteri, AJCC Version 9 - Clinical stage from 12/02/2022: Stage IIIC1 (cT1b3, cN1, cM0) - Signed by Artis Delay, MD on 12/15/2022 Stage prefix: Initial diagnosis   12/15/2022 PET scan   1. Hypermetabolic cervical mass consistent with known cervical carcinoma. 2. There is a mildly FDG avid left pelvic sidewall lymph node which is equivocal for nodal metastasis. 3. No additional sites of disease identified. 4. Aortic Atherosclerosis (ICD10-I70.0).     12/16/2022 Procedure   Successful placement of a right internal jugular approach power injectable Port-A-Cath. The catheter is ready for immediate use.     12/17/2022 Pathology Results   SURGICAL PATHOLOGY  CASE: [id number]  PATIENT: Lorraine Yates  Surgical Pathology Report   Clinical History: malignant neoplasm of cervix, unspecified site   FINAL MICROSCOPIC DIAGNOSIS:   A. 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     12/18/2022 Imaging   1. The normal cervical anatomy is completely effaced by a bulky mass measuring 7.9 x 6.9 x 5.5 cm. Mass extends into the lower uterine segment with loss of parametrial definition about the posterior 180 degrees of the upper portion of the mass. Mass remains confined to the upper third of the vagina. 2. Preserved fat planes to the adjacent bladder and rectum. No ureteral invasion or hydronephrosis. 3. Small left pelvic sidewall lymph node measuring 0.9 x 0.6 cm, previously with equivocal FDG avidity and suspicious for a small nodal metastasis. No other enlarged lymph nodes in the pelvis.     01/20/2023 - 01/20/2023 Chemotherapy   Patient is on Treatment Plan : HEAD/NECK Cisplatin (40) q7d     01/20/2023 -  Chemotherapy   Patient is on Treatment Plan : Cervical Pembrolizumab (200) q21d, cisplatin (40) q7d + XRT / pembrolizumab (400) q42d        PHYSICAL EXAMINATION: ECOG PERFORMANCE STATUS: 1 - Symptomatic but completely ambulatory  Vitals:   01/25/23 1354  BP: 110/68  Pulse: 89  Resp: 18  Temp: 99.4 F (37.4 C)  SpO2: 100%   Filed Weights   01/25/23 1354  Weight: 154 lb 6.4 oz (70 kg)    GENERAL:alert, no distress and comfortable NEURO: alert & oriented x 3 with fluent speech, no focal motor/sensory deficits  LABORATORY DATA:  I have  reviewed the data as listed    Component Value Date/Time   NA 141 01/21/2023 1407   K 3.7 01/21/2023 1407   CL 110 01/21/2023 1407   CO2 25 01/21/2023 1407   GLUCOSE 101 (H) 01/21/2023 1407   BUN 9 01/21/2023 1407   CREATININE 0.59 01/21/2023 1407   CALCIUM 9.5 01/21/2023 1407   PROT 6.6 01/21/2023 1407   ALBUMIN 3.5 01/21/2023 1407   AST 8 (L) 01/21/2023 1407   ALT <5 01/21/2023 1407   ALKPHOS 71 01/21/2023 1407   BILITOT 0.3 01/21/2023 1407   GFRNONAA >60 01/21/2023 1407    No results found for: "SPEP",  "UPEP"  Lab Results  Component Value Date   WBC 12.3 (H) 01/21/2023   NEUTROABS 10.7 (H) 01/21/2023   HGB 9.0 (L) 01/21/2023   HCT 29.6 (L) 01/21/2023   MCV 94.9 01/21/2023   PLT 618 (H) 01/21/2023      Chemistry      Component Value Date/Time   NA 141 01/21/2023 1407   K 3.7 01/21/2023 1407   CL 110 01/21/2023 1407   CO2 25 01/21/2023 1407   BUN 9 01/21/2023 1407   CREATININE 0.59 01/21/2023 1407      Component Value Date/Time   CALCIUM 9.5 01/21/2023 1407   ALKPHOS 71 01/21/2023 1407   AST 8 (L) 01/21/2023 1407   ALT <5 01/21/2023 1407   BILITOT 0.3 01/21/2023 1407

## 2023-01-26 NOTE — Assessment & Plan Note (Signed)
She tolerated cycle 1 of chemotherapy well without major complications. She has minimal vaginal bleeding.  I will see her weekly prior to chemotherapy as scheduled.

## 2023-01-27 ENCOUNTER — Other Ambulatory Visit: Payer: Self-pay

## 2023-01-27 ENCOUNTER — Ambulatory Visit
Admission: RE | Admit: 2023-01-27 | Discharge: 2023-01-27 | Disposition: A | Discharge status: Home / Self Care | Payer: BC Managed Care – PPO | Source: Ambulatory Visit | Attending: Radiation Oncology | Admitting: Radiation Oncology

## 2023-01-27 ENCOUNTER — Encounter: Payer: Self-pay | Admitting: Hematology and Oncology

## 2023-01-27 ENCOUNTER — Inpatient Hospital Stay: Payer: BC Managed Care – PPO

## 2023-01-27 VITALS — BP 106/72 | HR 87 | Temp 99.3°F | Resp 18 | Ht 64.0 in | Wt 151.0 lb

## 2023-01-27 DIAGNOSIS — N939 Abnormal uterine and vaginal bleeding, unspecified: Secondary | ICD-10-CM

## 2023-01-27 DIAGNOSIS — D508 Other iron deficiency anemias: Secondary | ICD-10-CM

## 2023-01-27 DIAGNOSIS — C531 Malignant neoplasm of exocervix: Secondary | ICD-10-CM

## 2023-01-27 DIAGNOSIS — Z510 Encounter for antineoplastic radiation therapy: Secondary | ICD-10-CM | POA: Diagnosis not present

## 2023-01-27 LAB — RAD ONC ARIA SESSION SUMMARY
Course Elapsed Days: 7
Plan Fractions Treated to Date: 6
Plan Prescribed Dose Per Fraction: 1.8 Gy
Plan Total Fractions Prescribed: 25
Plan Total Prescribed Dose: 45 Gy
Reference Point Dosage Given to Date: 10.8 Gy
Reference Point Session Dosage Given: 1.8 Gy
Session Number: 6

## 2023-01-27 MED ORDER — SODIUM CHLORIDE 0.9% FLUSH
10.0000 mL | INTRAVENOUS | Status: DC | PRN
  Administered 2023-01-27: 10 mL

## 2023-01-27 MED ORDER — MAGNESIUM SULFATE 2 GM/50ML IV SOLN
2.0000 g | Freq: Once | INTRAVENOUS | Status: AC
  Administered 2023-01-27: 2 g via INTRAVENOUS
  Filled 2023-01-27: qty 50

## 2023-01-27 MED ORDER — POTASSIUM CHLORIDE IN NACL 20-0.9 MEQ/L-% IV SOLN
Freq: Once | INTRAVENOUS | Status: AC
  Filled 2023-01-27: qty 1000

## 2023-01-27 MED ORDER — SODIUM CHLORIDE 0.9 % IV SOLN
10.0000 mg | Freq: Once | INTRAVENOUS | Status: AC
  Administered 2023-01-27: 10 mg via INTRAVENOUS
  Filled 2023-01-27: qty 10

## 2023-01-27 MED ORDER — SODIUM CHLORIDE 0.9 % IV SOLN: Freq: Once | INTRAVENOUS | Status: AC

## 2023-01-27 MED ORDER — SODIUM CHLORIDE 0.9 % IV SOLN
40.0000 mg/m2 | Freq: Once | INTRAVENOUS | Status: AC
  Administered 2023-01-27: 71 mg via INTRAVENOUS
  Filled 2023-01-27: qty 71

## 2023-01-27 MED ORDER — SODIUM CHLORIDE 0.9 % IV SOLN
200.0000 mg | Freq: Once | INTRAVENOUS | Status: AC
  Administered 2023-01-27: 200 mg via INTRAVENOUS
  Filled 2023-01-27: qty 200

## 2023-01-27 MED ORDER — HEPARIN SOD (PORK) LOCK FLUSH 100 UNIT/ML IV SOLN
500.0000 [IU] | Freq: Once | INTRAVENOUS | Status: AC | PRN
  Administered 2023-01-27: 500 [IU]

## 2023-01-27 MED ORDER — PALONOSETRON HCL INJECTION 0.25 MG/5ML
0.2500 mg | Freq: Once | INTRAVENOUS | Status: AC
  Administered 2023-01-27: 0.25 mg via INTRAVENOUS
  Filled 2023-01-27: qty 5

## 2023-01-27 MED ORDER — SODIUM CHLORIDE 0.9 % IV SOLN
150.0000 mg | Freq: Once | INTRAVENOUS | Status: AC
  Administered 2023-01-27: 150 mg via INTRAVENOUS
  Filled 2023-01-27: qty 150

## 2023-01-27 NOTE — Patient Instructions (Signed)
[facility]  Discharge Instructions: Thank you for choosing [facility] to provide your oncology and hematology care.   If you have a lab appointment with the Cancer Center, please go directly to the Cancer Center and check in at the registration area.   Wear comfortable clothing and clothing appropriate for easy access to any Portacath or PICC line.   We strive to give you quality time with your provider. You may need to reschedule your appointment if you arrive late (15 or more minutes).  Arriving late affects you and other patients whose appointments are after yours.  Also, if you miss three or more appointments without notifying the office, you may be dismissed from the clinic at the provider's discretion.      For prescription refill requests, have your pharmacy contact our office and allow 72 hours for refills to be completed.    Today you received the following chemotherapy and/or immunotherapy agents Cisplatin     To help prevent nausea and vomiting after your treatment, we encourage you to take your nausea medication as directed.  BELOW ARE SYMPTOMS THAT SHOULD BE REPORTED IMMEDIATELY: *FEVER GREATER THAN 100.4 F (38 C) OR HIGHER *CHILLS OR SWEATING *NAUSEA AND VOMITING THAT IS NOT CONTROLLED WITH YOUR NAUSEA MEDICATION *UNUSUAL SHORTNESS OF BREATH *UNUSUAL BRUISING OR BLEEDING *URINARY PROBLEMS (pain or burning when urinating, or frequent urination) *BOWEL PROBLEMS (unusual diarrhea, constipation, pain near the anus) TENDERNESS IN MOUTH AND THROAT WITH OR WITHOUT PRESENCE OF ULCERS (sore throat, sores in mouth, or a toothache) UNUSUAL RASH, SWELLING OR PAIN  UNUSUAL VAGINAL DISCHARGE OR ITCHING   Items with * indicate a potential emergency and should be followed up as soon as possible or go to the Emergency Department if any problems should occur.  Please show the CHEMOTHERAPY ALERT CARD or IMMUNOTHERAPY ALERT CARD at  check-in to the Emergency Department and triage nurse.  Should you have questions after your visit or need to cancel or reschedule your appointment, please contact [facility]  Dept: [phone]  and follow the prompts.  Office hours are 8:00 a.m. to 4:30 p.m. Monday - Friday. Please note that voicemails left after 4:00 p.m. may not be returned until the following business day.  We are closed weekends and major holidays. You have access to a nurse at all times for urgent questions. Please call the main number to the clinic Dept: [phone] and follow the prompts.   For any non-urgent questions, you may also contact your provider using MyChart. We now offer e-Visits for anyone 41 and older to request care online for non-urgent symptoms. For details visit [email or web address].   Also download the MyChart app! Go to the app store, search "MyChart", open the app, select [facility], and log in with your MyChart username and password.  Iron Sucrose Injection What is this medication? IRON SUCROSE (EYE ern SOO krose) treats low levels of iron (iron deficiency anemia) in people with kidney disease. Iron is a mineral that plays an important role in making red blood cells, which carry oxygen from your lungs to the rest of your body. This medicine may be used for other purposes; ask your health care provider or pharmacist if you have questions. COMMON BRAND NAME(S): Venofer What should I tell my care team before I take this medication? They need to know if you have any of these conditions: Anemia not caused by low iron levels Heart disease  High levels of iron in the blood Kidney disease Liver disease An unusual or allergic reaction to iron, other medications, foods, dyes, or preservatives Pregnant or trying to get pregnant Breastfeeding How should I use this medication? This medication is for infusion into a vein. It is given in a hospital or clinic  setting. Talk to your care team about the use of this medication in children. While this medication may be prescribed for children as young as 2 years for selected conditions, precautions do apply. Overdosage: If you think you have taken too much of this medicine contact a poison control center or emergency room at once. NOTE: This medicine is only for you. Do not share this medicine with others. What if I miss a dose? Keep appointments for follow-up doses. It is important not to miss your dose. Call your care team if you are unable to keep an appointment. What may interact with this medication? Do not take this medication with any of the following: Deferoxamine Dimercaprol Other iron products This medication may also interact with the following: Chloramphenicol Deferasirox This list may not describe all possible interactions. Give your health care provider a list of all the medicines, herbs, non-prescription drugs, or dietary supplements you use. Also tell them if you smoke, drink alcohol, or use illegal drugs. Some items may interact with your medicine. What should I watch for while using this medication? Visit your care team regularly. Tell your care team if your symptoms do not start to get better or if they get worse. You may need blood work done while you are taking this medication. You may need to follow a special diet. Talk to your care team. Foods that contain iron include: whole grains/cereals, dried fruits, beans, or peas, leafy green vegetables, and organ meats (liver, kidney). What side effects may I notice from receiving this medication? Side effects that you should report to your care team as soon as possible: Allergic reactions--skin rash, itching, hives, swelling of the face, lips, tongue, or throat Low blood pressure--dizziness, feeling faint or lightheaded, blurry vision Shortness of breath Side effects that usually do not require medical attention (report to your care team  if they continue or are bothersome): Flushing Headache Joint pain Muscle pain Nausea Pain, redness, or irritation at injection site This list may not describe all possible side effects. Call your doctor for medical advice about side effects. You may report side effects to FDA at 1-800-FDA-1088. Where should I keep my medication? This medication is given in a hospital or clinic and will not be stored at home. NOTE: This sheet is a summary. It may not cover all possible information. If you have questions about this medicine, talk to your doctor, pharmacist, or health care provider.  2024 Elsevier/Gold Standard (2022-01-06 00:00:00)

## 2023-01-28 ENCOUNTER — Ambulatory Visit
Admission: RE | Admit: 2023-01-28 | Discharge: 2023-01-28 | Disposition: A | Discharge status: Home / Self Care | Payer: BC Managed Care – PPO | Source: Ambulatory Visit | Attending: Radiation Oncology | Admitting: Radiation Oncology

## 2023-01-28 ENCOUNTER — Other Ambulatory Visit: Payer: Self-pay

## 2023-01-28 DIAGNOSIS — Z510 Encounter for antineoplastic radiation therapy: Secondary | ICD-10-CM | POA: Diagnosis not present

## 2023-01-28 LAB — RAD ONC ARIA SESSION SUMMARY
Course Elapsed Days: 8
Plan Fractions Treated to Date: 7
Plan Prescribed Dose Per Fraction: 1.8 Gy
Plan Total Fractions Prescribed: 25
Plan Total Prescribed Dose: 45 Gy
Reference Point Dosage Given to Date: 12.6 Gy
Reference Point Session Dosage Given: 1.8 Gy
Session Number: 7

## 2023-01-31 ENCOUNTER — Ambulatory Visit
Admission: RE | Admit: 2023-01-31 | Discharge: 2023-01-31 | Disposition: A | Discharge status: Home / Self Care | Payer: BC Managed Care – PPO | Source: Ambulatory Visit | Attending: Radiation Oncology | Admitting: Radiation Oncology

## 2023-01-31 ENCOUNTER — Inpatient Hospital Stay: Payer: BC Managed Care – PPO

## 2023-01-31 ENCOUNTER — Other Ambulatory Visit: Payer: Self-pay

## 2023-01-31 DIAGNOSIS — C539 Malignant neoplasm of cervix uteri, unspecified: Secondary | ICD-10-CM

## 2023-01-31 DIAGNOSIS — D508 Other iron deficiency anemias: Secondary | ICD-10-CM

## 2023-01-31 DIAGNOSIS — N939 Abnormal uterine and vaginal bleeding, unspecified: Secondary | ICD-10-CM

## 2023-01-31 DIAGNOSIS — C531 Malignant neoplasm of exocervix: Secondary | ICD-10-CM

## 2023-01-31 DIAGNOSIS — Z510 Encounter for antineoplastic radiation therapy: Secondary | ICD-10-CM | POA: Diagnosis not present

## 2023-01-31 LAB — RAD ONC ARIA SESSION SUMMARY
Course Elapsed Days: 11
Plan Fractions Treated to Date: 8
Plan Prescribed Dose Per Fraction: 1.8 Gy
Plan Total Fractions Prescribed: 25
Plan Total Prescribed Dose: 45 Gy
Reference Point Dosage Given to Date: 14.4 Gy
Reference Point Session Dosage Given: 1.8 Gy
Session Number: 8

## 2023-01-31 LAB — COMPREHENSIVE METABOLIC PANEL
ALT: 7 U/L (ref 0–44)
AST: 13 U/L — ABNORMAL LOW (ref 15–41)
Albumin: 3.3 g/dL — ABNORMAL LOW (ref 3.5–5.0)
Alkaline Phosphatase: 72 U/L (ref 38–126)
Anion gap: 6 (ref 5–15)
BUN: 10 mg/dL (ref 6–20)
CO2: 27 mmol/L (ref 22–32)
Calcium: 8.9 mg/dL (ref 8.9–10.3)
Chloride: 103 mmol/L (ref 98–111)
Creatinine, Ser: 0.67 mg/dL (ref 0.44–1.00)
GFR, Estimated: >60 mL/min (ref >60)
Glucose, Bld: 113 mg/dL — ABNORMAL HIGH (ref 70–99)
Potassium: 3.4 mmol/L — ABNORMAL LOW (ref 3.5–5.1)
Sodium: 136 mmol/L (ref 135–145)
Total Bilirubin: 0.1 mg/dL — ABNORMAL LOW (ref 0.3–1.2)
Total Protein: 6.3 g/dL — ABNORMAL LOW (ref 6.5–8.1)

## 2023-01-31 LAB — CBC WITH DIFFERENTIAL (CANCER CENTER ONLY)
Abs Immature Granulocytes: 0.03 10*3/uL (ref 0.00–0.07)
Basophils Absolute: 0 10*3/uL (ref 0.0–0.1)
Basophils Relative: 0 %
Eosinophils Absolute: 0.2 10*3/uL (ref 0.0–0.5)
Eosinophils Relative: 3 %
HCT: 26.2 % — ABNORMAL LOW (ref 36.0–46.0)
Hemoglobin: 8.4 g/dL — ABNORMAL LOW (ref 12.0–15.0)
Immature Granulocytes: 0 %
Lymphocytes Relative: 7 %
Lymphs Abs: 0.5 10*3/uL — ABNORMAL LOW (ref 0.7–4.0)
MCH: 29.6 pg (ref 26.0–34.0)
MCHC: 32.1 g/dL (ref 30.0–36.0)
MCV: 92.3 fL (ref 80.0–100.0)
Monocytes Absolute: 0.4 10*3/uL (ref 0.1–1.0)
Monocytes Relative: 5 %
Neutro Abs: 5.9 10*3/uL (ref 1.7–7.7)
Neutrophils Relative %: 85 %
Platelet Count: 443 10*3/uL — ABNORMAL HIGH (ref 150–400)
RBC: 2.84 MIL/uL — ABNORMAL LOW (ref 3.87–5.11)
RDW: 15.5 % (ref 11.5–15.5)
WBC Count: 7 10*3/uL (ref 4.0–10.5)
nRBC: 0 % (ref 0.0–0.2)

## 2023-01-31 LAB — PREGNANCY, URINE: Preg Test, Ur: NEGATIVE

## 2023-01-31 LAB — SAMPLE TO BLOOD BANK

## 2023-01-31 LAB — MAGNESIUM: Magnesium: 1.7 mg/dL (ref 1.7–2.4)

## 2023-01-31 MED ORDER — SODIUM CHLORIDE 0.9% FLUSH
10.0000 mL | Freq: Once | INTRAVENOUS | Status: AC
  Administered 2023-01-31: 10 mL

## 2023-01-31 MED ORDER — HEPARIN SOD (PORK) LOCK FLUSH 100 UNIT/ML IV SOLN
500.0000 [IU] | Freq: Once | INTRAVENOUS | Status: AC
  Administered 2023-01-31: 500 [IU]

## 2023-02-01 ENCOUNTER — Ambulatory Visit
Admission: RE | Admit: 2023-02-01 | Discharge: 2023-02-01 | Disposition: A | Discharge status: Home / Self Care | Payer: BC Managed Care – PPO | Source: Ambulatory Visit | Attending: Radiation Oncology | Admitting: Radiation Oncology

## 2023-02-01 ENCOUNTER — Encounter: Payer: Self-pay | Admitting: Hematology and Oncology

## 2023-02-01 ENCOUNTER — Inpatient Hospital Stay (HOSPITAL_BASED_OUTPATIENT_CLINIC_OR_DEPARTMENT_OTHER): Payer: BC Managed Care – PPO | Admitting: Hematology and Oncology

## 2023-02-01 ENCOUNTER — Other Ambulatory Visit: Payer: Self-pay

## 2023-02-01 VITALS — BP 108/70 | HR 96 | Temp 98.9°F | Resp 18 | Ht 64.0 in | Wt 151.6 lb

## 2023-02-01 DIAGNOSIS — Z510 Encounter for antineoplastic radiation therapy: Secondary | ICD-10-CM | POA: Diagnosis not present

## 2023-02-01 DIAGNOSIS — D508 Other iron deficiency anemias: Secondary | ICD-10-CM | POA: Diagnosis not present

## 2023-02-01 DIAGNOSIS — C531 Malignant neoplasm of exocervix: Secondary | ICD-10-CM

## 2023-02-01 DIAGNOSIS — R197 Diarrhea, unspecified: Secondary | ICD-10-CM | POA: Diagnosis not present

## 2023-02-01 LAB — RAD ONC ARIA SESSION SUMMARY
Course Elapsed Days: 12
Plan Fractions Treated to Date: 9
Plan Prescribed Dose Per Fraction: 1.8 Gy
Plan Total Fractions Prescribed: 25
Plan Total Prescribed Dose: 45 Gy
Reference Point Dosage Given to Date: 16.2 Gy
Reference Point Session Dosage Given: 1.8 Gy
Session Number: 9

## 2023-02-01 NOTE — Assessment & Plan Note (Signed)
This is stable She will continue treatment and IV iron as scheduled

## 2023-02-01 NOTE — Assessment & Plan Note (Signed)
Overall, she tolerated treatment well except for mild persistent anemia and loose stool I recommend the patient to quit the laxatives Her vaginal bleeding is almost completely resolved We will continue chemotherapy and radiation therapy as scheduled

## 2023-02-01 NOTE — Progress Notes (Signed)
[facility] OFFICE PROGRESS NOTE  Patient Care Team: Patient, No Pcp Per as PCP - General (General Practice)  ASSESSMENT & PLAN:  Cervical cancer (HCC) Overall, she tolerated treatment well except for mild persistent anemia and loose stool I recommend the patient to quit the laxatives Her vaginal bleeding is almost completely resolved We will continue chemotherapy and radiation therapy as scheduled  Iron deficiency anemia This is stable She will continue treatment and IV iron as scheduled  Diarrhea This is multifactorial, could be due to laxatives or radiation She will start laxative and take Imodium as needed  No orders of the defined types were placed in this encounter.   All questions were answered. The patient knows to call the clinic with any problems, questions or concerns. The total time spent in the appointment was 20 minutes encounter with patients including review of chart and various tests results, discussions about plan of care and coordination of care plan   Artis Delay, MD 02/01/2023 2:06 PM  INTERVAL HISTORY: Please see below for problem oriented charting. she returns for treatment follow-up seen prior to chemotherapy Vaginal bleeding is almost completely resolved She has mild sporadic spotting She had recent loose stool/diarrhea Denies nausea Her pain is well-controlled  REVIEW OF SYSTEMS:   Constitutional: Denies fevers, chills or abnormal weight loss Eyes: Denies blurriness of vision Ears, nose, mouth, throat, and face: Denies mucositis or sore throat Respiratory: Denies cough, dyspnea or wheezes Cardiovascular: Denies palpitation, chest discomfort or lower extremity swelling Skin: Denies abnormal skin rashes Lymphatics: Denies new lymphadenopathy or easy bruising Neurological:Denies numbness, tingling or new weaknesses Behavioral/Psych: Mood is stable, no new changes  All other systems were reviewed with the patient and are  negative.  I have reviewed the past medical history, past surgical history, social history and family history with the patient and they are unchanged from previous note.  ALLERGIES:  has No Known Allergies.  MEDICATIONS:  Current Outpatient Medications  Medication Sig Dispense Refill   lidocaine-prilocaine (EMLA) cream Apply 1 Application topically as needed. Apply to port site 1 hour to 30 minutes before port is accessed 30 g 1   ondansetron (ZOFRAN) 8 MG tablet Take 1 tablet (8 mg total) by mouth every 8 (eight) hours as needed for nausea or vomiting. Start on the third day after cisplatin. 30 tablet 1   oxyCODONE (OXY IR/ROXICODONE) 5 MG immediate release tablet Take 1 tablet (5 mg total) by mouth every 4 (four) hours as needed for severe pain. 60 tablet 0   prochlorperazine (COMPAZINE) 10 MG tablet TAKE 1 TABLET(10 MG) BY MOUTH EVERY 6 HOURS AS NEEDED FOR NAUSEA OR VOMITING 30 tablet 1   No current facility-administered medications for this visit.    SUMMARY OF ONCOLOGIC HISTORY: Oncology History Overview Note  PD-L1 CPS 1%   Cervical cancer (HCC)  11/24/2022 Initial Diagnosis   The patient presented recently to the emergency department with both vaginal bleeding and rectal bleeding.  Exam in the emergency department showed a 6 cm irregular cervical mass.     11/24/2022 Imaging   CT pelvis 1. Heterogeneous cervical mass is highly worrisome for cervical carcinoma. Significant border of contact with the adjacent rectum. Difficult to exclude invasion. 2. Locule of air within the cervical mass may be due to necrosis or extension from the vagina. Difficult to exclude a fistula to the rectum.   11/24/2022 Imaging   US pelvis 1. Large, approximately 7.6 cm vascular soft tissue mass of the lower uterine  segment. This seems inseparable from the cervix and Cervical Carcinoma is not excluded. Alternatively this might be a large lower uterine fibroid. Recommend direct visualization of the  cervix.   2. Elsewhere the uterus and endometrium are within normal limits. Normal ovaries and no free fluid.   11/26/2022 Pathology Results   SURGICAL PATHOLOGY  CASE: [id number]  PATIENT: Lorraine Yates  Surgical Pathology Report   FINAL MICROSCOPIC DIAGNOSIS:   A. 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  See comment.   COMMENT:   - Morphologic evaluation and p16 immunohistochemical stain reveal superficial squamous mucosa with findings consistent with high-grade squamous intraepithelial lesion.  The patient's clinical history of a cervical mass is noted, and a deeper more invasive process cannot be excluded.  Clinical and imaging correlation is recommended.      11/29/2022 Initial Diagnosis   Cervical cancer (HCC)   12/02/2022 Cancer Staging   Staging form: Cervix Uteri, AJCC Version 9 - Clinical stage from 12/02/2022: Stage IIIC1 (cT1b3, cN1, cM0) - Signed by Artis Delay, MD on 12/15/2022 Stage prefix: Initial diagnosis   12/15/2022 PET scan   1. Hypermetabolic cervical mass consistent with known cervical carcinoma. 2. There is a mildly FDG avid left pelvic sidewall lymph node which is equivocal for nodal metastasis. 3. No additional sites of disease identified. 4. Aortic Atherosclerosis (ICD10-I70.0).     12/16/2022 Procedure   Successful placement of a right internal jugular approach power injectable Port-A-Cath. The catheter is ready for immediate use.     12/17/2022 Pathology Results   SURGICAL PATHOLOGY  CASE: [id number]  PATIENT: Lorraine Yates  Surgical Pathology Report   Clinical History: malignant neoplasm of cervix, unspecified site   FINAL MICROSCOPIC DIAGNOSIS:   A. 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     12/18/2022 Imaging   1. The normal cervical anatomy is completely  effaced by a bulky mass measuring 7.9 x 6.9 x 5.5 cm. Mass extends into the lower uterine segment with loss of parametrial definition about the posterior 180 degrees of the upper portion of the mass. Mass remains confined to the upper third of the vagina. 2. Preserved fat planes to the adjacent bladder and rectum. No ureteral invasion or hydronephrosis. 3. Small left pelvic sidewall lymph node measuring 0.9 x 0.6 cm, previously with equivocal FDG avidity and suspicious for a small nodal metastasis. No other enlarged lymph nodes in the pelvis.     01/20/2023 - 01/20/2023 Chemotherapy   Patient is on Treatment Plan : HEAD/NECK Cisplatin (40) q7d     01/20/2023 -  Chemotherapy   Patient is on Treatment Plan : Cervical Pembrolizumab (200) q21d, cisplatin (40) q7d + XRT / pembrolizumab (400) q42d        PHYSICAL EXAMINATION: ECOG PERFORMANCE STATUS: 1 - Symptomatic but completely ambulatory  Vitals:   02/01/23 1357  BP: 108/70  Pulse: 96  Resp: 18  Temp: 98.9 F (37.2 C)  SpO2: 100%   Filed Weights   02/01/23 1357  Weight: 151 lb 9.6 oz (68.8 kg)    GENERAL:alert, no distress and comfortable NEURO: alert & oriented x 3 with fluent speech, no focal motor/sensory deficits  LABORATORY DATA:  I have reviewed the data as listed    Component Value Date/Time   NA 136 01/31/2023 1458   K 3.4 (L) 01/31/2023 1458   CL 103 01/31/2023 1458  CO2 27 01/31/2023 1458   GLUCOSE 113 (H) 01/31/2023 1458   BUN 10 01/31/2023 1458   CREATININE 0.67 01/31/2023 1458   CALCIUM 8.9 01/31/2023 1458   PROT 6.3 (L) 01/31/2023 1458   ALBUMIN 3.3 (L) 01/31/2023 1458   AST 13 (L) 01/31/2023 1458   ALT 7 01/31/2023 1458   ALKPHOS 72 01/31/2023 1458   BILITOT 0.1 (L) 01/31/2023 1458   GFRNONAA >60 01/31/2023 1458    No results found for: "SPEP", "UPEP"  Lab Results  Component Value Date   WBC 7.0 01/31/2023   NEUTROABS 5.9 01/31/2023   HGB 8.4 (L) 01/31/2023   HCT 26.2 (L) 01/31/2023   MCV 92.3  01/31/2023   PLT 443 (H) 01/31/2023      Chemistry      Component Value Date/Time   NA 136 01/31/2023 1458   K 3.4 (L) 01/31/2023 1458   CL 103 01/31/2023 1458   CO2 27 01/31/2023 1458   BUN 10 01/31/2023 1458   CREATININE 0.67 01/31/2023 1458      Component Value Date/Time   CALCIUM 8.9 01/31/2023 1458   ALKPHOS 72 01/31/2023 1458   AST 13 (L) 01/31/2023 1458   ALT 7 01/31/2023 1458   BILITOT 0.1 (L) 01/31/2023 1458

## 2023-02-01 NOTE — Assessment & Plan Note (Signed)
This is multifactorial, could be due to laxatives or radiation She will start laxative and take Imodium as needed

## 2023-02-02 ENCOUNTER — Other Ambulatory Visit: Payer: Self-pay

## 2023-02-02 ENCOUNTER — Other Ambulatory Visit (HOSPITAL_COMMUNITY): Payer: Self-pay | Admitting: Radiation Oncology

## 2023-02-02 ENCOUNTER — Ambulatory Visit: Admission: RE | Admit: 2023-02-02 | Payer: BC Managed Care – PPO | Source: Ambulatory Visit

## 2023-02-02 DIAGNOSIS — Z510 Encounter for antineoplastic radiation therapy: Secondary | ICD-10-CM | POA: Diagnosis not present

## 2023-02-02 DIAGNOSIS — Z8541 Personal history of malignant neoplasm of cervix uteri: Secondary | ICD-10-CM

## 2023-02-02 LAB — RAD ONC ARIA SESSION SUMMARY
Course Elapsed Days: 13
Plan Fractions Treated to Date: 10
Plan Prescribed Dose Per Fraction: 1.8 Gy
Plan Total Fractions Prescribed: 25
Plan Total Prescribed Dose: 45 Gy
Reference Point Dosage Given to Date: 18 Gy
Reference Point Session Dosage Given: 1.8 Gy
Session Number: 10

## 2023-02-02 MED FILL — Dexamethasone Sodium Phosphate Inj 100 MG/10ML: INTRAMUSCULAR | Qty: 1 | Status: AC

## 2023-02-02 MED FILL — Fosaprepitant Dimeglumine For IV Infusion 150 MG (Base Eq): INTRAVENOUS | Qty: 5 | Status: AC

## 2023-02-03 ENCOUNTER — Other Ambulatory Visit: Payer: Self-pay

## 2023-02-03 ENCOUNTER — Ambulatory Visit
Admission: RE | Admit: 2023-02-03 | Discharge: 2023-02-03 | Disposition: A | Discharge status: Home / Self Care | Payer: BC Managed Care – PPO | Source: Ambulatory Visit | Attending: Radiation Oncology | Admitting: Radiation Oncology

## 2023-02-03 ENCOUNTER — Inpatient Hospital Stay: Payer: BC Managed Care – PPO

## 2023-02-03 ENCOUNTER — Other Ambulatory Visit (HOSPITAL_COMMUNITY): Payer: Self-pay | Admitting: Radiation Oncology

## 2023-02-03 VITALS — BP 107/67 | HR 79 | Temp 99.1°F | Resp 16

## 2023-02-03 DIAGNOSIS — C531 Malignant neoplasm of exocervix: Secondary | ICD-10-CM

## 2023-02-03 DIAGNOSIS — N939 Abnormal uterine and vaginal bleeding, unspecified: Secondary | ICD-10-CM

## 2023-02-03 DIAGNOSIS — Z8541 Personal history of malignant neoplasm of cervix uteri: Secondary | ICD-10-CM

## 2023-02-03 DIAGNOSIS — Z510 Encounter for antineoplastic radiation therapy: Secondary | ICD-10-CM | POA: Diagnosis not present

## 2023-02-03 DIAGNOSIS — D508 Other iron deficiency anemias: Secondary | ICD-10-CM

## 2023-02-03 LAB — RAD ONC ARIA SESSION SUMMARY
Course Elapsed Days: 14
Plan Fractions Treated to Date: 11
Plan Prescribed Dose Per Fraction: 1.8 Gy
Plan Total Fractions Prescribed: 25
Plan Total Prescribed Dose: 45 Gy
Reference Point Dosage Given to Date: 19.8 Gy
Reference Point Session Dosage Given: 1.8 Gy
Session Number: 11

## 2023-02-03 MED ORDER — SODIUM CHLORIDE 0.9 % IV SOLN
200.0000 mg | Freq: Once | INTRAVENOUS | Status: AC
  Administered 2023-02-03: 200 mg via INTRAVENOUS
  Filled 2023-02-03: qty 200

## 2023-02-03 MED ORDER — MAGNESIUM SULFATE 2 GM/50ML IV SOLN
2.0000 g | Freq: Once | INTRAVENOUS | Status: AC
  Administered 2023-02-03: 2 g via INTRAVENOUS
  Filled 2023-02-03: qty 50

## 2023-02-03 MED ORDER — SODIUM CHLORIDE 0.9 % IV SOLN: Freq: Once | INTRAVENOUS | Status: AC

## 2023-02-03 MED ORDER — SODIUM CHLORIDE 0.9 % IV SOLN
40.0000 mg/m2 | Freq: Once | INTRAVENOUS | Status: AC
  Administered 2023-02-03: 71 mg via INTRAVENOUS
  Filled 2023-02-03: qty 71

## 2023-02-03 MED ORDER — HEPARIN SOD (PORK) LOCK FLUSH 100 UNIT/ML IV SOLN
500.0000 [IU] | Freq: Once | INTRAVENOUS | Status: AC | PRN
  Administered 2023-02-03: 500 [IU]

## 2023-02-03 MED ORDER — SODIUM CHLORIDE 0.9% FLUSH
10.0000 mL | INTRAVENOUS | Status: DC | PRN
  Administered 2023-02-03: 10 mL

## 2023-02-03 MED ORDER — POTASSIUM CHLORIDE IN NACL 20-0.9 MEQ/L-% IV SOLN
Freq: Once | INTRAVENOUS | Status: AC
  Filled 2023-02-03: qty 1000

## 2023-02-03 MED ORDER — SODIUM CHLORIDE 0.9 % IV SOLN
10.0000 mg | Freq: Once | INTRAVENOUS | Status: AC
  Administered 2023-02-03: 10 mg via INTRAVENOUS
  Filled 2023-02-03: qty 10

## 2023-02-03 MED ORDER — PALONOSETRON HCL INJECTION 0.25 MG/5ML
0.2500 mg | Freq: Once | INTRAVENOUS | Status: AC
  Administered 2023-02-03: 0.25 mg via INTRAVENOUS
  Filled 2023-02-03: qty 5

## 2023-02-03 MED ORDER — SODIUM CHLORIDE 0.9 % IV SOLN
150.0000 mg | Freq: Once | INTRAVENOUS | Status: AC
  Administered 2023-02-03: 150 mg via INTRAVENOUS
  Filled 2023-02-03: qty 150

## 2023-02-03 NOTE — Progress Notes (Signed)
Pt observed for 30 minutes post Venofer infusion. Pt tolerated Tx well w/out incident. VSS.

## 2023-02-03 NOTE — Patient Instructions (Signed)
[facility]  Discharge Instructions: Thank you for choosing [facility] to provide your oncology and hematology care.   If you have a lab appointment with the [facility], please go directly to the [facility] and check in at the registration area.   Wear comfortable clothing and clothing appropriate for easy access to any Portacath or PICC line.   We strive to give you quality time with your provider. You may need to reschedule your appointment if you arrive late (15 or more minutes).  Arriving late affects you and other patients whose appointments are after yours.  Also, if you miss three or more appointments without notifying the office, you may be dismissed from the clinic at the provider's discretion.      For prescription refill requests, have your pharmacy contact our office and allow 72 hours for refills to be completed.    Today you received the following chemotherapy and/or immunotherapy agents Cisplatin      To help prevent nausea and vomiting after your treatment, we encourage you to take your nausea medication as directed.  BELOW ARE SYMPTOMS THAT SHOULD BE REPORTED IMMEDIATELY: *FEVER GREATER THAN 100.4 F (38 C) OR HIGHER *CHILLS OR SWEATING *NAUSEA AND VOMITING THAT IS NOT CONTROLLED WITH YOUR NAUSEA MEDICATION *UNUSUAL SHORTNESS OF BREATH *UNUSUAL BRUISING OR BLEEDING *URINARY PROBLEMS (pain or burning when urinating, or frequent urination) *BOWEL PROBLEMS (unusual diarrhea, constipation, pain near the anus) TENDERNESS IN MOUTH AND THROAT WITH OR WITHOUT PRESENCE OF ULCERS (sore throat, sores in mouth, or a toothache) UNUSUAL RASH, SWELLING OR PAIN  UNUSUAL VAGINAL DISCHARGE OR ITCHING   Items with * indicate a potential emergency and should be followed up as soon as possible or go to the Emergency Department if any problems should occur.  Please show the CHEMOTHERAPY ALERT CARD or IMMUNOTHERAPY ALERT CARD at  check-in to the Emergency Department and triage nurse.  Should you have questions after your visit or need to cancel or reschedule your appointment, please contact [facility]  Dept: [phone]  and follow the prompts.  Office hours are 8:00 a.m. to 4:30 p.m. Monday - Friday. Please note that voicemails left after 4:00 p.m. may not be returned until the following business day.  We are closed weekends and major holidays. You have access to a nurse at all times for urgent questions. Please call the main number to the clinic Dept: [phone] and follow the prompts.   For any non-urgent questions, you may also contact your provider using MyChart. We now offer e-Visits for anyone 56 and older to request care online for non-urgent symptoms. For details visit [email or web address].   Also download the MyChart app! Go to the app store, search "MyChart", open the app, select [facility], and log in with your MyChart username and password.

## 2023-02-03 NOTE — Progress Notes (Signed)
Per Dr. Bertis Ruddy OK to infuse post-hydration fluids concurrently with Cisplatin infusion.

## 2023-02-04 ENCOUNTER — Other Ambulatory Visit: Payer: Self-pay

## 2023-02-04 ENCOUNTER — Ambulatory Visit
Admission: RE | Admit: 2023-02-04 | Discharge: 2023-02-04 | Disposition: A | Discharge status: Home / Self Care | Payer: BC Managed Care – PPO | Source: Ambulatory Visit | Attending: Radiation Oncology | Admitting: Radiation Oncology

## 2023-02-04 ENCOUNTER — Other Ambulatory Visit (HOSPITAL_COMMUNITY): Payer: Self-pay | Admitting: Radiation Oncology

## 2023-02-04 DIAGNOSIS — Z510 Encounter for antineoplastic radiation therapy: Secondary | ICD-10-CM | POA: Diagnosis not present

## 2023-02-04 DIAGNOSIS — Z8541 Personal history of malignant neoplasm of cervix uteri: Secondary | ICD-10-CM

## 2023-02-04 LAB — RAD ONC ARIA SESSION SUMMARY
Course Elapsed Days: 15
Plan Fractions Treated to Date: 12
Plan Prescribed Dose Per Fraction: 1.8 Gy
Plan Total Fractions Prescribed: 25
Plan Total Prescribed Dose: 45 Gy
Reference Point Dosage Given to Date: 21.6 Gy
Reference Point Session Dosage Given: 1.8 Gy
Session Number: 12

## 2023-02-06 ENCOUNTER — Other Ambulatory Visit: Payer: Self-pay

## 2023-02-07 ENCOUNTER — Ambulatory Visit
Admission: RE | Admit: 2023-02-07 | Discharge: 2023-02-07 | Disposition: A | Discharge status: Home / Self Care | Payer: BC Managed Care – PPO | Source: Ambulatory Visit | Attending: Radiation Oncology | Admitting: Radiation Oncology

## 2023-02-07 ENCOUNTER — Other Ambulatory Visit: Payer: Self-pay

## 2023-02-07 ENCOUNTER — Inpatient Hospital Stay: Payer: BC Managed Care – PPO

## 2023-02-07 DIAGNOSIS — D508 Other iron deficiency anemias: Secondary | ICD-10-CM

## 2023-02-07 DIAGNOSIS — N939 Abnormal uterine and vaginal bleeding, unspecified: Secondary | ICD-10-CM

## 2023-02-07 DIAGNOSIS — Z510 Encounter for antineoplastic radiation therapy: Secondary | ICD-10-CM | POA: Diagnosis not present

## 2023-02-07 DIAGNOSIS — C539 Malignant neoplasm of cervix uteri, unspecified: Secondary | ICD-10-CM

## 2023-02-07 DIAGNOSIS — C531 Malignant neoplasm of exocervix: Secondary | ICD-10-CM

## 2023-02-07 LAB — CBC WITH DIFFERENTIAL/PLATELET
Abs Immature Granulocytes: 0.02 10*3/uL (ref 0.00–0.07)
Basophils Absolute: 0 10*3/uL (ref 0.0–0.1)
Basophils Relative: 1 %
Eosinophils Absolute: 0.2 10*3/uL (ref 0.0–0.5)
Eosinophils Relative: 4 %
HCT: 24.2 % — ABNORMAL LOW (ref 36.0–46.0)
Hemoglobin: 7.7 g/dL — ABNORMAL LOW (ref 12.0–15.0)
Immature Granulocytes: 1 %
Lymphocytes Relative: 12 %
Lymphs Abs: 0.5 10*3/uL — ABNORMAL LOW (ref 0.7–4.0)
MCH: 30 pg (ref 26.0–34.0)
MCHC: 31.8 g/dL (ref 30.0–36.0)
MCV: 94.2 fL (ref 80.0–100.0)
Monocytes Absolute: 0.3 10*3/uL (ref 0.1–1.0)
Monocytes Relative: 7 %
Neutro Abs: 3 10*3/uL (ref 1.7–7.7)
Neutrophils Relative %: 75 %
Platelets: 280 10*3/uL (ref 150–400)
RBC: 2.57 MIL/uL — ABNORMAL LOW (ref 3.87–5.11)
RDW: 16 % — ABNORMAL HIGH (ref 11.5–15.5)
WBC: 3.9 10*3/uL — ABNORMAL LOW (ref 4.0–10.5)
nRBC: 0 % (ref 0.0–0.2)

## 2023-02-07 LAB — COMPREHENSIVE METABOLIC PANEL
ALT: 7 U/L (ref 0–44)
AST: 10 U/L — ABNORMAL LOW (ref 15–41)
Albumin: 3.2 g/dL — ABNORMAL LOW (ref 3.5–5.0)
Alkaline Phosphatase: 68 U/L (ref 38–126)
Anion gap: 6 (ref 5–15)
BUN: 17 mg/dL (ref 6–20)
CO2: 29 mmol/L (ref 22–32)
Calcium: 8.7 mg/dL — ABNORMAL LOW (ref 8.9–10.3)
Chloride: 107 mmol/L (ref 98–111)
Creatinine, Ser: 0.74 mg/dL (ref 0.44–1.00)
GFR, Estimated: >60 mL/min (ref >60)
Glucose, Bld: 126 mg/dL — ABNORMAL HIGH (ref 70–99)
Potassium: 3.3 mmol/L — ABNORMAL LOW (ref 3.5–5.1)
Sodium: 142 mmol/L (ref 135–145)
Total Bilirubin: 0.1 mg/dL — ABNORMAL LOW (ref 0.3–1.2)
Total Protein: 5.9 g/dL — ABNORMAL LOW (ref 6.5–8.1)

## 2023-02-07 LAB — RAD ONC ARIA SESSION SUMMARY
Course Elapsed Days: 18
Plan Fractions Treated to Date: 13
Plan Prescribed Dose Per Fraction: 1.8 Gy
Plan Total Fractions Prescribed: 25
Plan Total Prescribed Dose: 45 Gy
Reference Point Dosage Given to Date: 23.4 Gy
Reference Point Session Dosage Given: 1.8 Gy
Session Number: 13

## 2023-02-07 LAB — PREGNANCY, URINE: Preg Test, Ur: NEGATIVE

## 2023-02-07 LAB — SAMPLE TO BLOOD BANK

## 2023-02-07 MED ORDER — HEPARIN SOD (PORK) LOCK FLUSH 100 UNIT/ML IV SOLN
500.0000 [IU] | Freq: Once | INTRAVENOUS | Status: AC
  Administered 2023-02-07: 500 [IU]

## 2023-02-07 MED ORDER — SODIUM CHLORIDE 0.9% FLUSH
10.0000 mL | Freq: Once | INTRAVENOUS | Status: AC
  Administered 2023-02-07: 10 mL

## 2023-02-08 ENCOUNTER — Ambulatory Visit
Admission: RE | Admit: 2023-02-08 | Discharge: 2023-02-08 | Disposition: A | Discharge status: Home / Self Care | Payer: BC Managed Care – PPO | Source: Ambulatory Visit | Attending: Radiation Oncology | Admitting: Radiation Oncology

## 2023-02-08 ENCOUNTER — Encounter: Payer: Self-pay | Admitting: Hematology and Oncology

## 2023-02-08 ENCOUNTER — Inpatient Hospital Stay (HOSPITAL_BASED_OUTPATIENT_CLINIC_OR_DEPARTMENT_OTHER): Payer: BC Managed Care – PPO | Admitting: Hematology and Oncology

## 2023-02-08 ENCOUNTER — Other Ambulatory Visit: Payer: Self-pay

## 2023-02-08 ENCOUNTER — Inpatient Hospital Stay: Payer: BC Managed Care – PPO

## 2023-02-08 VITALS — BP 115/67 | HR 76 | Temp 98.3°F | Resp 18 | Ht 64.0 in | Wt 157.4 lb

## 2023-02-08 DIAGNOSIS — D508 Other iron deficiency anemias: Secondary | ICD-10-CM

## 2023-02-08 DIAGNOSIS — G893 Neoplasm related pain (acute) (chronic): Secondary | ICD-10-CM

## 2023-02-08 DIAGNOSIS — C531 Malignant neoplasm of exocervix: Secondary | ICD-10-CM | POA: Diagnosis not present

## 2023-02-08 DIAGNOSIS — Z510 Encounter for antineoplastic radiation therapy: Secondary | ICD-10-CM | POA: Diagnosis not present

## 2023-02-08 LAB — RAD ONC ARIA SESSION SUMMARY
Course Elapsed Days: 19
Plan Fractions Treated to Date: 14
Plan Prescribed Dose Per Fraction: 1.8 Gy
Plan Total Fractions Prescribed: 25
Plan Total Prescribed Dose: 45 Gy
Reference Point Dosage Given to Date: 25.2 Gy
Reference Point Session Dosage Given: 1.8 Gy
Session Number: 14

## 2023-02-08 LAB — TYPE AND SCREEN
ABO/RH(D): A NEG
Antibody Screen: NEGATIVE
Unit division: 0

## 2023-02-08 LAB — BPAM RBC
Blood Product Expiration Date: 202409012359
Unit Type and Rh: 600

## 2023-02-08 LAB — PREPARE RBC (CROSSMATCH)

## 2023-02-08 NOTE — Progress Notes (Signed)
[facility] OFFICE PROGRESS NOTE  Patient Care Team: Patient, No Pcp Per as PCP - General (General Practice)  ASSESSMENT & PLAN:  Cervical cancer (HCC) She tolerated treatment well but has developed pancytopenia Will proceed with treatment with additional transfusion support  Iron deficiency anemia We discussed some of the risks, benefits, and alternatives of blood transfusions. The patient is symptomatic from anemia and the hemoglobin level is critically low.  Some of the side-effects to be expected including risks of transfusion reactions, chills, infection, syndrome of volume overload and risk of hospitalization from various reasons and the patient is willing to proceed and went ahead to sign consent today. She will receive chemotherapy and blood transfusion on Thursday  Cancer associated pain She denies excessive pain She will continue pain medicine as needed  Orders Placed This Encounter  Procedures   Informed Consent Details: Physician/Practitioner Attestation; Transcribe to consent form and obtain patient signature    Standing Status:   Future    Standing Expiration Date:   02/08/2024    Order Specific Question:   Physician/Practitioner attestation of informed consent for blood and or blood product transfusion    Answer:   I, the physician/practitioner, attest that I have discussed with the patient the benefits, risks, side effects, alternatives, likelihood of achieving goals and potential problems during recovery for the procedure that I have provided informed consent.    Order Specific Question:   Product(s)    Answer:   All Product(s)   Care order/instruction    Transfuse Parameters    Standing Status:   Future    Standing Expiration Date:   02/08/2024   Type and screen         Standing Status:   Future    Number of Occurrences:   1    Standing Expiration Date:   02/08/2024   Prepare RBC (crossmatch)    Standing Status:   Standing    Number of Occurrences:    1    Order Specific Question:   # of Units    Answer:   1 unit    Order Specific Question:   Transfusion Indications    Answer:   Hemoglobin 8 gm/dL or less and orthopedic or cardiac surgery or pre-existing cardiac condition    Order Specific Question:   Number of Units to Keep Ahead    Answer:   NO units ahead    Order Specific Question:   Instructions:    Answer:   Transfuse    Order Specific Question:   If emergent release call blood bank    Answer:   Not emergent release    All questions were answered. The patient knows to call the clinic with any problems, questions or concerns. The total time spent in the appointment was 30 minutes encounter with patients including review of chart and various tests results, discussions about plan of care and coordination of care plan   Artis Delay, MD 02/08/2023 3:17 PM  INTERVAL HISTORY: Please see below for problem oriented charting. she returns for treatment follow-up She has minor spotting but denies excessive vaginal bleeding Denies recent infection No nausea or changes in bowel habits We discussed test results and transfusion support  REVIEW OF SYSTEMS:   Constitutional: Denies fevers, chills or abnormal weight loss Eyes: Denies blurriness of vision Ears, nose, mouth, throat, and face: Denies mucositis or sore throat Respiratory: Denies cough, dyspnea or wheezes Cardiovascular: Denies palpitation, chest discomfort or lower extremity swelling Gastrointestinal:  Denies nausea,  heartburn or change in bowel habits Skin: Denies abnormal skin rashes Lymphatics: Denies new lymphadenopathy or easy bruising Neurological:Denies numbness, tingling or new weaknesses Behavioral/Psych: Mood is stable, no new changes  All other systems were reviewed with the patient and are negative.  I have reviewed the past medical history, past surgical history, social history and family history with the patient and they are unchanged from previous  note.  ALLERGIES:  has No Known Allergies.  MEDICATIONS:  Current Outpatient Medications  Medication Sig Dispense Refill   lidocaine-prilocaine (EMLA) cream Apply 1 Application topically as needed. Apply to port site 1 hour to 30 minutes before port is accessed 30 g 1   ondansetron (ZOFRAN) 8 MG tablet Take 1 tablet (8 mg total) by mouth every 8 (eight) hours as needed for nausea or vomiting. Start on the third day after cisplatin. 30 tablet 1   oxyCODONE (OXY IR/ROXICODONE) 5 MG immediate release tablet Take 1 tablet (5 mg total) by mouth every 4 (four) hours as needed for severe pain. 60 tablet 0   prochlorperazine (COMPAZINE) 10 MG tablet TAKE 1 TABLET(10 MG) BY MOUTH EVERY 6 HOURS AS NEEDED FOR NAUSEA OR VOMITING 30 tablet 1   No current facility-administered medications for this visit.    SUMMARY OF ONCOLOGIC HISTORY: Oncology History Overview Note  PD-L1 CPS 1%   Cervical cancer (HCC)  11/24/2022 Initial Diagnosis   The patient presented recently to the emergency department with both vaginal bleeding and rectal bleeding.  Exam in the emergency department showed a 6 cm irregular cervical mass.     11/24/2022 Imaging   CT pelvis 1. Heterogeneous cervical mass is highly worrisome for cervical carcinoma. Significant border of contact with the adjacent rectum. Difficult to exclude invasion. 2. Locule of air within the cervical mass may be due to necrosis or extension from the vagina. Difficult to exclude a fistula to the rectum.   11/24/2022 Imaging   US pelvis 1. Large, approximately 7.6 cm vascular soft tissue mass of the lower uterine segment. This seems inseparable from the cervix and Cervical Carcinoma is not excluded. Alternatively this might be a large lower uterine fibroid. Recommend direct visualization of the cervix.   2. Elsewhere the uterus and endometrium are within normal limits. Normal ovaries and no free fluid.   11/26/2022 Pathology Results   SURGICAL PATHOLOGY   CASE: [id number]  PATIENT: Lorraine Yates  Surgical Pathology Report   FINAL MICROSCOPIC DIAGNOSIS:   A. 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  See comment.   COMMENT:   - Morphologic evaluation and p16 immunohistochemical stain reveal superficial squamous mucosa with findings consistent with high-grade squamous intraepithelial lesion.  The patient's clinical history of a cervical mass is noted, and a deeper more invasive process cannot be excluded.  Clinical and imaging correlation is recommended.      11/29/2022 Initial Diagnosis   Cervical cancer (HCC)   12/02/2022 Cancer Staging   Staging form: Cervix Uteri, AJCC Version 9 - Clinical stage from 12/02/2022: Stage IIIC1 (cT1b3, cN1, cM0) - Signed by Artis Delay, MD on 12/15/2022 Stage prefix: Initial diagnosis   12/15/2022 PET scan   1. Hypermetabolic cervical mass consistent with known cervical carcinoma. 2. There is a mildly FDG avid left pelvic sidewall lymph node which is equivocal for nodal metastasis. 3. No additional sites of disease identified. 4. Aortic Atherosclerosis (ICD10-I70.0).     12/16/2022 Procedure   Successful placement of a right internal jugular approach power injectable Port-A-Cath.  The catheter is ready for immediate use.     12/17/2022 Pathology Results   SURGICAL PATHOLOGY  CASE: [id number]  PATIENT: Lorraine Yates  Surgical Pathology Report   Clinical History: malignant neoplasm of cervix, unspecified site   FINAL MICROSCOPIC DIAGNOSIS:   A. 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     12/18/2022 Imaging   1. The normal cervical anatomy is completely effaced by a bulky mass measuring 7.9 x 6.9 x 5.5 cm. Mass extends into the lower uterine segment with loss of parametrial definition about the posterior 180 degrees of  the upper portion of the mass. Mass remains confined to the upper third of the vagina. 2. Preserved fat planes to the adjacent bladder and rectum. No ureteral invasion or hydronephrosis. 3. Small left pelvic sidewall lymph node measuring 0.9 x 0.6 cm, previously with equivocal FDG avidity and suspicious for a small nodal metastasis. No other enlarged lymph nodes in the pelvis.     01/20/2023 - 01/20/2023 Chemotherapy   Patient is on Treatment Plan : HEAD/NECK Cisplatin (40) q7d     01/20/2023 -  Chemotherapy   Patient is on Treatment Plan : Cervical Pembrolizumab (200) q21d, cisplatin (40) q7d + XRT / pembrolizumab (400) q42d        PHYSICAL EXAMINATION: ECOG PERFORMANCE STATUS: 1 - Symptomatic but completely ambulatory  Vitals:   02/08/23 1402  BP: 115/67  Pulse: 76  Resp: 18  Temp: 98.3 F (36.8 C)  SpO2: 100%   Filed Weights   02/08/23 1402  Weight: 157 lb 6.4 oz (71.4 kg)    GENERAL:alert, no distress and comfortable NEURO: alert & oriented x 3 with fluent speech, no focal motor/sensory deficits  LABORATORY DATA:  I have reviewed the data as listed    Component Value Date/Time   NA 142 02/07/2023 1522   K 3.3 (L) 02/07/2023 1522   CL 107 02/07/2023 1522   CO2 29 02/07/2023 1522   GLUCOSE 126 (H) 02/07/2023 1522   BUN 17 02/07/2023 1522   CREATININE 0.74 02/07/2023 1522   CALCIUM 8.7 (L) 02/07/2023 1522   PROT 5.9 (L) 02/07/2023 1522   ALBUMIN 3.2 (L) 02/07/2023 1522   AST 10 (L) 02/07/2023 1522   ALT 7 02/07/2023 1522   ALKPHOS 68 02/07/2023 1522   BILITOT 0.1 (L) 02/07/2023 1522   GFRNONAA >60 02/07/2023 1522    No results found for: "SPEP", "UPEP"  Lab Results  Component Value Date   WBC 3.9 (L) 02/07/2023   NEUTROABS 3.0 02/07/2023   HGB 7.7 (L) 02/07/2023   HCT 24.2 (L) 02/07/2023   MCV 94.2 02/07/2023   PLT 280 02/07/2023      Chemistry      Component Value Date/Time   NA 142 02/07/2023 1522   K 3.3 (L) 02/07/2023 1522   CL 107 02/07/2023  1522   CO2 29 02/07/2023 1522   BUN 17 02/07/2023 1522   CREATININE 0.74 02/07/2023 1522      Component Value Date/Time   CALCIUM 8.7 (L) 02/07/2023 1522   ALKPHOS 68 02/07/2023 1522   AST 10 (L) 02/07/2023 1522   ALT 7 02/07/2023 1522   BILITOT 0.1 (L) 02/07/2023 1522

## 2023-02-08 NOTE — Assessment & Plan Note (Signed)
We discussed some of the risks, benefits, and alternatives of blood transfusions. The patient is symptomatic from anemia and the hemoglobin level is critically low.  Some of the side-effects to be expected including risks of transfusion reactions, chills, infection, syndrome of volume overload and risk of hospitalization from various reasons and the patient is willing to proceed and went ahead to sign consent today. She will receive chemotherapy and blood transfusion on Thursday

## 2023-02-08 NOTE — Assessment & Plan Note (Signed)
She denies excessive pain She will continue pain medicine as needed

## 2023-02-08 NOTE — Assessment & Plan Note (Signed)
She tolerated treatment well but has developed pancytopenia Will proceed with treatment with additional transfusion support

## 2023-02-09 ENCOUNTER — Other Ambulatory Visit: Payer: Self-pay

## 2023-02-09 ENCOUNTER — Encounter: Payer: Self-pay | Admitting: Hematology and Oncology

## 2023-02-09 ENCOUNTER — Ambulatory Visit
Admission: RE | Admit: 2023-02-09 | Discharge: 2023-02-09 | Disposition: A | Discharge status: Home / Self Care | Payer: BC Managed Care – PPO | Source: Ambulatory Visit | Attending: Radiation Oncology | Admitting: Radiation Oncology

## 2023-02-09 DIAGNOSIS — Z510 Encounter for antineoplastic radiation therapy: Secondary | ICD-10-CM | POA: Diagnosis not present

## 2023-02-09 LAB — RAD ONC ARIA SESSION SUMMARY
Course Elapsed Days: 20
Plan Fractions Treated to Date: 15
Plan Prescribed Dose Per Fraction: 1.8 Gy
Plan Total Fractions Prescribed: 25
Plan Total Prescribed Dose: 45 Gy
Reference Point Dosage Given to Date: 27 Gy
Reference Point Session Dosage Given: 1.8 Gy
Session Number: 15

## 2023-02-09 MED FILL — Fosaprepitant Dimeglumine For IV Infusion 150 MG (Base Eq): INTRAVENOUS | Qty: 5 | Status: AC

## 2023-02-09 MED FILL — Dexamethasone Sodium Phosphate Inj 100 MG/10ML: INTRAMUSCULAR | Qty: 1 | Status: AC

## 2023-02-10 ENCOUNTER — Encounter: Payer: Self-pay | Admitting: Hematology and Oncology

## 2023-02-10 ENCOUNTER — Other Ambulatory Visit: Payer: Self-pay

## 2023-02-10 ENCOUNTER — Ambulatory Visit
Admission: RE | Admit: 2023-02-10 | Discharge: 2023-02-10 | Disposition: A | Discharge status: Home / Self Care | Payer: BC Managed Care – PPO | Source: Ambulatory Visit | Attending: Radiation Oncology | Admitting: Radiation Oncology

## 2023-02-10 ENCOUNTER — Inpatient Hospital Stay: Payer: BC Managed Care – PPO

## 2023-02-10 VITALS — BP 133/82 | HR 60 | Temp 98.3°F | Resp 16

## 2023-02-10 DIAGNOSIS — N888 Other specified noninflammatory disorders of cervix uteri: Secondary | ICD-10-CM | POA: Diagnosis present

## 2023-02-10 DIAGNOSIS — Z720 Tobacco use: Secondary | ICD-10-CM | POA: Insufficient documentation

## 2023-02-10 DIAGNOSIS — D61818 Other pancytopenia: Secondary | ICD-10-CM | POA: Insufficient documentation

## 2023-02-10 DIAGNOSIS — R197 Diarrhea, unspecified: Secondary | ICD-10-CM | POA: Insufficient documentation

## 2023-02-10 DIAGNOSIS — C539 Malignant neoplasm of cervix uteri, unspecified: Secondary | ICD-10-CM | POA: Insufficient documentation

## 2023-02-10 DIAGNOSIS — Z5112 Encounter for antineoplastic immunotherapy: Secondary | ICD-10-CM | POA: Insufficient documentation

## 2023-02-10 DIAGNOSIS — G893 Neoplasm related pain (acute) (chronic): Secondary | ICD-10-CM | POA: Insufficient documentation

## 2023-02-10 DIAGNOSIS — C531 Malignant neoplasm of exocervix: Secondary | ICD-10-CM | POA: Diagnosis present

## 2023-02-10 DIAGNOSIS — D509 Iron deficiency anemia, unspecified: Secondary | ICD-10-CM | POA: Insufficient documentation

## 2023-02-10 DIAGNOSIS — N939 Abnormal uterine and vaginal bleeding, unspecified: Secondary | ICD-10-CM | POA: Insufficient documentation

## 2023-02-10 DIAGNOSIS — C538 Malignant neoplasm of overlapping sites of cervix uteri: Secondary | ICD-10-CM | POA: Diagnosis present

## 2023-02-10 DIAGNOSIS — D508 Other iron deficiency anemias: Secondary | ICD-10-CM

## 2023-02-10 LAB — RAD ONC ARIA SESSION SUMMARY
Course Elapsed Days: 21
Plan Fractions Treated to Date: 16
Plan Prescribed Dose Per Fraction: 1.8 Gy
Plan Total Fractions Prescribed: 25
Plan Total Prescribed Dose: 45 Gy
Reference Point Dosage Given to Date: 28.8 Gy
Reference Point Session Dosage Given: 1.8 Gy
Session Number: 16

## 2023-02-10 MED ORDER — SODIUM CHLORIDE 0.9% FLUSH
10.0000 mL | INTRAVENOUS | Status: DC | PRN
  Administered 2023-02-10: 10 mL

## 2023-02-10 MED ORDER — SODIUM CHLORIDE 0.9 % IV SOLN
200.0000 mg | Freq: Once | INTRAVENOUS | Status: AC
  Administered 2023-02-10: 200 mg via INTRAVENOUS
  Filled 2023-02-10: qty 200

## 2023-02-10 MED ORDER — ACETAMINOPHEN 325 MG PO TABS
650.0000 mg | ORAL_TABLET | Freq: Once | ORAL | Status: AC
  Administered 2023-02-10: 650 mg via ORAL
  Filled 2023-02-10: qty 2

## 2023-02-10 MED ORDER — POTASSIUM CHLORIDE IN NACL 20-0.9 MEQ/L-% IV SOLN
Freq: Once | INTRAVENOUS | Status: AC
  Filled 2023-02-10: qty 1000

## 2023-02-10 MED ORDER — PALONOSETRON HCL INJECTION 0.25 MG/5ML
0.2500 mg | Freq: Once | INTRAVENOUS | Status: AC
  Administered 2023-02-10: 0.25 mg via INTRAVENOUS
  Filled 2023-02-10: qty 5

## 2023-02-10 MED ORDER — SODIUM CHLORIDE 0.9 % IV SOLN: Freq: Once | INTRAVENOUS | Status: AC

## 2023-02-10 MED ORDER — SODIUM CHLORIDE 0.9 % IV SOLN
40.0000 mg/m2 | Freq: Once | INTRAVENOUS | Status: AC
  Administered 2023-02-10: 71 mg via INTRAVENOUS
  Filled 2023-02-10: qty 71

## 2023-02-10 MED ORDER — MAGNESIUM SULFATE 2 GM/50ML IV SOLN
2.0000 g | Freq: Once | INTRAVENOUS | Status: AC
  Administered 2023-02-10: 2 g via INTRAVENOUS
  Filled 2023-02-10: qty 50

## 2023-02-10 MED ORDER — HEPARIN SOD (PORK) LOCK FLUSH 100 UNIT/ML IV SOLN
500.0000 [IU] | Freq: Once | INTRAVENOUS | Status: AC | PRN
  Administered 2023-02-10: 500 [IU]

## 2023-02-10 MED ORDER — SODIUM CHLORIDE 0.9 % IV SOLN
10.0000 mg | Freq: Once | INTRAVENOUS | Status: AC
  Administered 2023-02-10: 10 mg via INTRAVENOUS
  Filled 2023-02-10: qty 10

## 2023-02-10 MED ORDER — DIPHENHYDRAMINE HCL 25 MG PO CAPS
25.0000 mg | ORAL_CAPSULE | Freq: Once | ORAL | Status: AC
  Administered 2023-02-10: 25 mg via ORAL
  Filled 2023-02-10: qty 1

## 2023-02-10 MED ORDER — SODIUM CHLORIDE 0.9 % IV SOLN
150.0000 mg | Freq: Once | INTRAVENOUS | Status: AC
  Administered 2023-02-10: 150 mg via INTRAVENOUS
  Filled 2023-02-10: qty 150

## 2023-02-10 NOTE — Patient Instructions (Addendum)
[facility]  Discharge Instructions: Thank you for choosing [facility] to provide your oncology and hematology care.   If you have a lab appointment with the Cancer Center, please go directly to the Cancer Center and check in at the registration area.   Wear comfortable clothing and clothing appropriate for easy access to any Portacath or PICC line.   We strive to give you quality time with your provider. You may need to reschedule your appointment if you arrive late (15 or more minutes).  Arriving late affects you and other patients whose appointments are after yours.  Also, if you miss three or more appointments without notifying the office, you may be dismissed from the clinic at the provider's discretion.      For prescription refill requests, have your pharmacy contact our office and allow 72 hours for refills to be completed.    Today you received the following chemotherapy and/or immunotherapy agents: Cisplatin      To help prevent nausea and vomiting after your treatment, we encourage you to take your nausea medication as directed.  BELOW ARE SYMPTOMS THAT SHOULD BE REPORTED IMMEDIATELY: *FEVER GREATER THAN 100.4 F (38 C) OR HIGHER *CHILLS OR SWEATING *NAUSEA AND VOMITING THAT IS NOT CONTROLLED WITH YOUR NAUSEA MEDICATION *UNUSUAL SHORTNESS OF BREATH *UNUSUAL BRUISING OR BLEEDING *URINARY PROBLEMS (pain or burning when urinating, or frequent urination) *BOWEL PROBLEMS (unusual diarrhea, constipation, pain near the anus) TENDERNESS IN MOUTH AND THROAT WITH OR WITHOUT PRESENCE OF ULCERS (sore throat, sores in mouth, or a toothache) UNUSUAL RASH, SWELLING OR PAIN  UNUSUAL VAGINAL DISCHARGE OR ITCHING   Items with * indicate a potential emergency and should be followed up as soon as possible or go to the Emergency Department if any problems should occur.  Please show the CHEMOTHERAPY ALERT CARD or IMMUNOTHERAPY ALERT CARD at  check-in to the Emergency Department and triage nurse.  Should you have questions after your visit or need to cancel or reschedule your appointment, please contact [facility]  Dept: [phone]  and follow the prompts.  Office hours are 8:00 a.m. to 4:30 p.m. Monday - Friday. Please note that voicemails left after 4:00 p.m. may not be returned until the following business day.  We are closed weekends and major holidays. You have access to a nurse at all times for urgent questions. Please call the main number to the clinic Dept: [phone] and follow the prompts.   For any non-urgent questions, you may also contact your provider using MyChart. We now offer e-Visits for anyone 19 and older to request care online for non-urgent symptoms. For details visit [email or web address].   Also download the MyChart app! Go to the app store, search "MyChart", open the app, select [facility], and log in with your MyChart username and password.  Blood Transfusion, Adult, Care After The following information offers guidance on how to care for yourself after your procedure. Your health care provider may also give you more specific instructions. If you have problems or questions, contact your health care provider. What can I expect after the procedure? After the procedure, it is common to have: Bruising and soreness where the IV was inserted. A headache. Follow these instructions at home: IV insertion site care     Follow instructions from your health care provider about how to take care of your IV insertion site. Make sure you: Wash your hands with soap and water  for at least 20 seconds before and after you change your bandage (dressing). If soap and water are not available, use hand sanitizer. Change your dressing as told by your health care provider. Check your IV insertion site every day for signs of infection. Check for: Redness, swelling, or pain. Bleeding from  the site. Warmth. Pus or a bad smell. General instructions Take over-the-counter and prescription medicines only as told by your health care provider. Rest as told by your health care provider. Return to your normal activities as told by your health care provider. Keep all follow-up visits. Lab tests may need to be done at certain periods to recheck your blood counts. Contact a health care provider if: You have itching or red, swollen areas of skin (hives). You have a fever or chills. You have pain in the head, back, or chest. You feel anxious or you feel weak after doing your normal activities. You have redness, swelling, warmth, or pain around the IV insertion site. You have blood coming from the IV insertion site that does not stop with pressure. You have pus or a bad smell coming from your IV insertion site. If you received your blood transfusion in an outpatient setting, you will be told whom to contact to report any reactions. Get help right away if: You have symptoms of a serious allergic or immune system reaction, including: Trouble breathing or shortness of breath. Swelling of the face, feeling flushed, or widespread rash. Dark urine or blood in the urine. Fast heartbeat. These symptoms may be an emergency. Get help right away. Call 911. Do not wait to see if the symptoms will go away. Do not drive yourself to the hospital. Summary Bruising and soreness around the IV insertion site are common. Check your IV insertion site every day for signs of infection. Rest as told by your health care provider. Return to your normal activities as told by your health care provider. Get help right away for symptoms of a serious allergic or immune system reaction to the blood transfusion. This information is not intended to replace advice given to you by your health care provider. Make sure you discuss any questions you have with your health care provider. Document Revised: 09/25/2021  Document Reviewed: 09/25/2021 Elsevier Patient Education  2024 [employer].

## 2023-02-10 NOTE — Progress Notes (Signed)
Per Dr. Bertis Ruddy-   ok to proceed with treatment today with the magnesium level of 1.7 drawn on 01/31/23.  Ok to run post hydration fluids with cisplatin.

## 2023-02-11 ENCOUNTER — Other Ambulatory Visit: Payer: Self-pay

## 2023-02-11 ENCOUNTER — Ambulatory Visit
Admission: RE | Admit: 2023-02-11 | Discharge: 2023-02-11 | Disposition: A | Discharge status: Home / Self Care | Payer: BC Managed Care – PPO | Source: Ambulatory Visit | Attending: Radiation Oncology | Admitting: Radiation Oncology

## 2023-02-11 DIAGNOSIS — C531 Malignant neoplasm of exocervix: Secondary | ICD-10-CM | POA: Diagnosis not present

## 2023-02-11 LAB — RAD ONC ARIA SESSION SUMMARY
Course Elapsed Days: 22
Plan Fractions Treated to Date: 17
Plan Prescribed Dose Per Fraction: 1.8 Gy
Plan Total Fractions Prescribed: 25
Plan Total Prescribed Dose: 45 Gy
Reference Point Dosage Given to Date: 30.6 Gy
Reference Point Session Dosage Given: 1.8 Gy
Session Number: 17

## 2023-02-14 ENCOUNTER — Other Ambulatory Visit: Payer: Self-pay

## 2023-02-14 ENCOUNTER — Ambulatory Visit
Admission: RE | Admit: 2023-02-14 | Discharge: 2023-02-14 | Disposition: A | Discharge status: Home / Self Care | Payer: BC Managed Care – PPO | Source: Ambulatory Visit | Attending: Radiation Oncology | Admitting: Radiation Oncology

## 2023-02-14 ENCOUNTER — Inpatient Hospital Stay: Payer: BC Managed Care – PPO

## 2023-02-14 DIAGNOSIS — C531 Malignant neoplasm of exocervix: Secondary | ICD-10-CM | POA: Diagnosis not present

## 2023-02-14 DIAGNOSIS — D508 Other iron deficiency anemias: Secondary | ICD-10-CM

## 2023-02-14 DIAGNOSIS — N939 Abnormal uterine and vaginal bleeding, unspecified: Secondary | ICD-10-CM

## 2023-02-14 LAB — BASIC METABOLIC PANEL - CANCER CENTER ONLY
Anion gap: 5 (ref 5–15)
BUN: 13 mg/dL (ref 6–20)
CO2: 30 mmol/L (ref 22–32)
Calcium: 7.9 mg/dL — ABNORMAL LOW (ref 8.9–10.3)
Chloride: 104 mmol/L (ref 98–111)
Creatinine: 0.77 mg/dL (ref 0.44–1.00)
GFR, Estimated: >60 mL/min (ref >60)
Glucose, Bld: 97 mg/dL (ref 70–99)
Potassium: 3.4 mmol/L — ABNORMAL LOW (ref 3.5–5.1)
Sodium: 139 mmol/L (ref 135–145)

## 2023-02-14 LAB — RAD ONC ARIA SESSION SUMMARY
Course Elapsed Days: 25
Plan Fractions Treated to Date: 18
Plan Prescribed Dose Per Fraction: 1.8 Gy
Plan Total Fractions Prescribed: 25
Plan Total Prescribed Dose: 45 Gy
Reference Point Dosage Given to Date: 32.4 Gy
Reference Point Session Dosage Given: 1.8 Gy
Session Number: 18

## 2023-02-14 LAB — CBC WITH DIFFERENTIAL (CANCER CENTER ONLY)
Abs Immature Granulocytes: 0.01 10*3/uL (ref 0.00–0.07)
Basophils Absolute: 0 10*3/uL (ref 0.0–0.1)
Basophils Relative: 0 %
Eosinophils Absolute: 0.1 10*3/uL (ref 0.0–0.5)
Eosinophils Relative: 2 %
HCT: 28.6 % — ABNORMAL LOW (ref 36.0–46.0)
Hemoglobin: 9.6 g/dL — ABNORMAL LOW (ref 12.0–15.0)
Immature Granulocytes: 0 %
Lymphocytes Relative: 13 %
Lymphs Abs: 0.3 10*3/uL — ABNORMAL LOW (ref 0.7–4.0)
MCH: 30.7 pg (ref 26.0–34.0)
MCHC: 33.6 g/dL (ref 30.0–36.0)
MCV: 91.4 fL (ref 80.0–100.0)
Monocytes Absolute: 0.2 10*3/uL (ref 0.1–1.0)
Monocytes Relative: 8 %
Neutro Abs: 2 10*3/uL (ref 1.7–7.7)
Neutrophils Relative %: 77 %
Platelet Count: 208 10*3/uL (ref 150–400)
RBC: 3.13 MIL/uL — ABNORMAL LOW (ref 3.87–5.11)
RDW: 15.9 % — ABNORMAL HIGH (ref 11.5–15.5)
WBC Count: 2.6 10*3/uL — ABNORMAL LOW (ref 4.0–10.5)
nRBC: 0 % (ref 0.0–0.2)

## 2023-02-14 LAB — PREGNANCY, URINE: Preg Test, Ur: NEGATIVE

## 2023-02-14 LAB — MAGNESIUM: Magnesium: 1.1 mg/dL — ABNORMAL LOW (ref 1.7–2.4)

## 2023-02-14 MED ORDER — SODIUM CHLORIDE 0.9% FLUSH
10.0000 mL | Freq: Once | INTRAVENOUS | Status: AC
  Administered 2023-02-14: 10 mL

## 2023-02-14 MED ORDER — HEPARIN SOD (PORK) LOCK FLUSH 100 UNIT/ML IV SOLN
500.0000 [IU] | Freq: Once | INTRAVENOUS | Status: AC
  Administered 2023-02-14: 500 [IU]

## 2023-02-15 ENCOUNTER — Other Ambulatory Visit: Payer: Self-pay

## 2023-02-15 ENCOUNTER — Ambulatory Visit: Admission: RE | Admit: 2023-02-15 | Payer: BC Managed Care – PPO | Source: Ambulatory Visit

## 2023-02-15 ENCOUNTER — Inpatient Hospital Stay (HOSPITAL_BASED_OUTPATIENT_CLINIC_OR_DEPARTMENT_OTHER): Payer: BC Managed Care – PPO | Admitting: Hematology and Oncology

## 2023-02-15 ENCOUNTER — Encounter: Payer: Self-pay | Admitting: Hematology and Oncology

## 2023-02-15 ENCOUNTER — Ambulatory Visit
Admission: RE | Admit: 2023-02-15 | Discharge: 2023-02-15 | Disposition: A | Discharge status: Home / Self Care | Payer: BC Managed Care – PPO | Source: Ambulatory Visit | Attending: Radiation Oncology | Admitting: Radiation Oncology

## 2023-02-15 VITALS — BP 122/76 | HR 73 | Temp 98.1°F | Resp 18 | Ht 63.0 in | Wt 159.8 lb

## 2023-02-15 DIAGNOSIS — D508 Other iron deficiency anemias: Secondary | ICD-10-CM | POA: Diagnosis not present

## 2023-02-15 DIAGNOSIS — G893 Neoplasm related pain (acute) (chronic): Secondary | ICD-10-CM

## 2023-02-15 DIAGNOSIS — C531 Malignant neoplasm of exocervix: Secondary | ICD-10-CM

## 2023-02-15 LAB — RAD ONC ARIA SESSION SUMMARY
Course Elapsed Days: 26
Plan Fractions Treated to Date: 19
Plan Prescribed Dose Per Fraction: 1.8 Gy
Plan Total Fractions Prescribed: 25
Plan Total Prescribed Dose: 45 Gy
Reference Point Dosage Given to Date: 34.2 Gy
Reference Point Session Dosage Given: 1.8 Gy
Session Number: 19

## 2023-02-15 MED ORDER — MAGNESIUM OXIDE -MG SUPPLEMENT 400 (240 MG) MG PO TABS: 400.0000 mg | ORAL_TABLET | Freq: Every day | ORAL | 1 refills | Status: DC

## 2023-02-15 NOTE — Assessment & Plan Note (Signed)
Likely caused by cisplatin She will get additional IV magnesium to cycle and oral magnesium replacement as well

## 2023-02-15 NOTE — Assessment & Plan Note (Signed)
She received blood transfusion last week with improvement She will continue intravenous iron sucrose with her chemotherapy

## 2023-02-15 NOTE — Assessment & Plan Note (Signed)
She denies excessive pain She will continue pain medicine as needed

## 2023-02-15 NOTE — Assessment & Plan Note (Signed)
She tolerated treatment well but has developed pancytopenia and very low magnesium level Will proceed with treatment with additional IV magnesium along with oral supplement She will get intravenous iron infusion as well I will see her again next week for further follow-up

## 2023-02-15 NOTE — Progress Notes (Signed)
[facility] OFFICE PROGRESS NOTE  Patient Care Team: Patient, No Pcp Per as PCP - General (General Practice)  ASSESSMENT & PLAN:  Cervical cancer (HCC) She tolerated treatment well but has developed pancytopenia and very low magnesium level Will proceed with treatment with additional IV magnesium along with oral supplement She will get intravenous iron infusion as well I will see her again next week for further follow-up  Cancer associated pain She denies excessive pain She will continue pain medicine as needed  Hypomagnesemia Likely caused by cisplatin She will get additional IV magnesium to cycle and oral magnesium replacement as well  Iron deficiency anemia She received blood transfusion last week with improvement She will continue intravenous iron sucrose with her chemotherapy  No orders of the defined types were placed in this encounter.   All questions were answered. The patient knows to call the clinic with any problems, questions or concerns. The total time spent in the appointment was 30 minutes encounter with patients including review of chart and various tests results, discussions about plan of care and coordination of care plan   Artis Delay, MD 02/15/2023 2:38 PM  INTERVAL HISTORY: Please see below for problem oriented charting. she returns for treatment follow-up She tolerated last cycle well She has very mild leg swelling after treatment but this is stable Her chronic pelvic pain is stable We discussed test results  REVIEW OF SYSTEMS:   Constitutional: Denies fevers, chills or abnormal weight loss Eyes: Denies blurriness of vision Ears, nose, mouth, throat, and face: Denies mucositis or sore throat Respiratory: Denies cough, dyspnea or wheezes Cardiovascular: Denies palpitation, chest discomfort  Gastrointestinal:  Denies nausea, heartburn or change in bowel habits Skin: Denies abnormal skin rashes Lymphatics: Denies new lymphadenopathy or  easy bruising Neurological:Denies numbness, tingling or new weaknesses Behavioral/Psych: Mood is stable, no new changes  All other systems were reviewed with the patient and are negative.  I have reviewed the past medical history, past surgical history, social history and family history with the patient and they are unchanged from previous note.  ALLERGIES:  has No Known Allergies.  MEDICATIONS:  Current Outpatient Medications  Medication Sig Dispense Refill   magnesium oxide (MAG-OX) 400 (240 Mg) MG tablet Take 1 tablet (400 mg total) by mouth daily. 30 tablet 1   lidocaine-prilocaine (EMLA) cream Apply 1 Application topically as needed. Apply to port site 1 hour to 30 minutes before port is accessed 30 g 1   ondansetron (ZOFRAN) 8 MG tablet Take 1 tablet (8 mg total) by mouth every 8 (eight) hours as needed for nausea or vomiting. Start on the third day after cisplatin. 30 tablet 1   oxyCODONE (OXY IR/ROXICODONE) 5 MG immediate release tablet Take 1 tablet (5 mg total) by mouth every 4 (four) hours as needed for severe pain. 60 tablet 0   prochlorperazine (COMPAZINE) 10 MG tablet TAKE 1 TABLET(10 MG) BY MOUTH EVERY 6 HOURS AS NEEDED FOR NAUSEA OR VOMITING 30 tablet 1   No current facility-administered medications for this visit.    SUMMARY OF ONCOLOGIC HISTORY: Oncology History Overview Note  PD-L1 CPS 1%   Cervical cancer (HCC)  11/24/2022 Initial Diagnosis   The patient presented recently to the emergency department with both vaginal bleeding and rectal bleeding.  Exam in the emergency department showed a 6 cm irregular cervical mass.     11/24/2022 Imaging   CT pelvis 1. Heterogeneous cervical mass is highly worrisome for cervical carcinoma. Significant border of contact  with the adjacent rectum. Difficult to exclude invasion. 2. Locule of air within the cervical mass may be due to necrosis or extension from the vagina. Difficult to exclude a fistula to the rectum.   11/24/2022  Imaging   US pelvis 1. Large, approximately 7.6 cm vascular soft tissue mass of the lower uterine segment. This seems inseparable from the cervix and Cervical Carcinoma is not excluded. Alternatively this might be a large lower uterine fibroid. Recommend direct visualization of the cervix.   2. Elsewhere the uterus and endometrium are within normal limits. Normal ovaries and no free fluid.   11/26/2022 Pathology Results   SURGICAL PATHOLOGY  CASE: [id number]  PATIENT: Lorraine Yates  Surgical Pathology Report   FINAL MICROSCOPIC DIAGNOSIS:   A. 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  See comment.   COMMENT:   - Morphologic evaluation and p16 immunohistochemical stain reveal superficial squamous mucosa with findings consistent with high-grade squamous intraepithelial lesion.  The patient's clinical history of a cervical mass is noted, and a deeper more invasive process cannot be excluded.  Clinical and imaging correlation is recommended.      11/29/2022 Initial Diagnosis   Cervical cancer (HCC)   12/02/2022 Cancer Staging   Staging form: Cervix Uteri, AJCC Version 9 - Clinical stage from 12/02/2022: Stage IIIC1 (cT1b3, cN1, cM0) - Signed by Artis Delay, MD on 12/15/2022 Stage prefix: Initial diagnosis   12/15/2022 PET scan   1. Hypermetabolic cervical mass consistent with known cervical carcinoma. 2. There is a mildly FDG avid left pelvic sidewall lymph node which is equivocal for nodal metastasis. 3. No additional sites of disease identified. 4. Aortic Atherosclerosis (ICD10-I70.0).     12/16/2022 Procedure   Successful placement of a right internal jugular approach power injectable Port-A-Cath. The catheter is ready for immediate use.     12/17/2022 Pathology Results   SURGICAL PATHOLOGY  CASE: [id number]  PATIENT: Lorraine Yates  Surgical Pathology Report   Clinical History: malignant neoplasm of cervix, unspecified site   FINAL  MICROSCOPIC DIAGNOSIS:   A. 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     12/18/2022 Imaging   1. The normal cervical anatomy is completely effaced by a bulky mass measuring 7.9 x 6.9 x 5.5 cm. Mass extends into the lower uterine segment with loss of parametrial definition about the posterior 180 degrees of the upper portion of the mass. Mass remains confined to the upper third of the vagina. 2. Preserved fat planes to the adjacent bladder and rectum. No ureteral invasion or hydronephrosis. 3. Small left pelvic sidewall lymph node measuring 0.9 x 0.6 cm, previously with equivocal FDG avidity and suspicious for a small nodal metastasis. No other enlarged lymph nodes in the pelvis.     01/20/2023 - 01/20/2023 Chemotherapy   Patient is on Treatment Plan : HEAD/NECK Cisplatin (40) q7d     01/20/2023 -  Chemotherapy   Patient is on Treatment Plan : Cervical Pembrolizumab (200) q21d, cisplatin (40) q7d + XRT / pembrolizumab (400) q42d        PHYSICAL EXAMINATION: ECOG PERFORMANCE STATUS: 1 - Symptomatic but completely ambulatory  Vitals:   02/15/23 1411  BP: 122/76  Pulse: 73  Resp: 18  Temp: 98.1 F (36.7 C)  SpO2: 100%   Filed Weights   02/15/23 1411  Weight: 159 lb 12.8 oz (72.5 kg)  GENERAL:alert, no distress and comfortable NEURO: alert & oriented x 3 with fluent speech, no focal motor/sensory deficits  LABORATORY DATA:  I have reviewed the data as listed    Component Value Date/Time   NA 139 02/14/2023 1453   K 3.4 (L) 02/14/2023 1453   CL 104 02/14/2023 1453   CO2 30 02/14/2023 1453   GLUCOSE 97 02/14/2023 1453   BUN 13 02/14/2023 1453   CREATININE 0.77 02/14/2023 1453   CALCIUM 7.9 (L) 02/14/2023 1453   PROT 5.9 (L) 02/07/2023 1522   ALBUMIN 3.2 (L) 02/07/2023 1522   AST 10 (L) 02/07/2023 1522   ALT 7 02/07/2023 1522    ALKPHOS 68 02/07/2023 1522   BILITOT 0.1 (L) 02/07/2023 1522   GFRNONAA >60 02/14/2023 1453    No results found for: "SPEP", "UPEP"  Lab Results  Component Value Date   WBC 2.6 (L) 02/14/2023   NEUTROABS 2.0 02/14/2023   HGB 9.6 (L) 02/14/2023   HCT 28.6 (L) 02/14/2023   MCV 91.4 02/14/2023   PLT 208 02/14/2023      Chemistry      Component Value Date/Time   NA 139 02/14/2023 1453   K 3.4 (L) 02/14/2023 1453   CL 104 02/14/2023 1453   CO2 30 02/14/2023 1453   BUN 13 02/14/2023 1453   CREATININE 0.77 02/14/2023 1453      Component Value Date/Time   CALCIUM 7.9 (L) 02/14/2023 1453   ALKPHOS 68 02/07/2023 1522   AST 10 (L) 02/07/2023 1522   ALT 7 02/07/2023 1522   BILITOT 0.1 (L) 02/07/2023 1522

## 2023-02-16 ENCOUNTER — Ambulatory Visit
Admission: RE | Admit: 2023-02-16 | Discharge: 2023-02-16 | Disposition: A | Discharge status: Home / Self Care | Payer: BC Managed Care – PPO | Source: Ambulatory Visit | Attending: Radiation Oncology | Admitting: Radiation Oncology

## 2023-02-16 ENCOUNTER — Other Ambulatory Visit: Payer: Self-pay

## 2023-02-16 DIAGNOSIS — C531 Malignant neoplasm of exocervix: Secondary | ICD-10-CM | POA: Diagnosis not present

## 2023-02-16 LAB — RAD ONC ARIA SESSION SUMMARY
Course Elapsed Days: 27
Plan Fractions Treated to Date: 20
Plan Prescribed Dose Per Fraction: 1.8 Gy
Plan Total Fractions Prescribed: 25
Plan Total Prescribed Dose: 45 Gy
Reference Point Dosage Given to Date: 36 Gy
Reference Point Session Dosage Given: 1.8 Gy
Session Number: 20

## 2023-02-16 MED FILL — Dexamethasone Sodium Phosphate Inj 100 MG/10ML: INTRAMUSCULAR | Qty: 1 | Status: AC

## 2023-02-16 MED FILL — Fosaprepitant Dimeglumine For IV Infusion 150 MG (Base Eq): INTRAVENOUS | Qty: 5 | Status: AC

## 2023-02-17 ENCOUNTER — Other Ambulatory Visit: Payer: Self-pay

## 2023-02-17 ENCOUNTER — Inpatient Hospital Stay: Payer: BC Managed Care – PPO

## 2023-02-17 ENCOUNTER — Ambulatory Visit: Payer: BC Managed Care – PPO

## 2023-02-17 VITALS — BP 134/81 | HR 64 | Temp 98.6°F | Resp 18

## 2023-02-17 DIAGNOSIS — D508 Other iron deficiency anemias: Secondary | ICD-10-CM

## 2023-02-17 DIAGNOSIS — C531 Malignant neoplasm of exocervix: Secondary | ICD-10-CM

## 2023-02-17 DIAGNOSIS — N939 Abnormal uterine and vaginal bleeding, unspecified: Secondary | ICD-10-CM

## 2023-02-17 MED ORDER — MAGNESIUM SULFATE 4 GM/100ML IV SOLN
4.0000 g | Freq: Once | INTRAVENOUS | Status: AC
  Administered 2023-02-17: 4 g via INTRAVENOUS
  Filled 2023-02-17: qty 100

## 2023-02-17 MED ORDER — SODIUM CHLORIDE 0.9 % IV SOLN
40.0000 mg/m2 | Freq: Once | INTRAVENOUS | Status: AC
  Administered 2023-02-17: 71 mg via INTRAVENOUS
  Filled 2023-02-17: qty 71

## 2023-02-17 MED ORDER — HEPARIN SOD (PORK) LOCK FLUSH 100 UNIT/ML IV SOLN
500.0000 [IU] | Freq: Once | INTRAVENOUS | Status: AC | PRN
  Administered 2023-02-17: 500 [IU]

## 2023-02-17 MED ORDER — SODIUM CHLORIDE 0.9 % IV SOLN
10.0000 mg | Freq: Once | INTRAVENOUS | Status: AC
  Administered 2023-02-17: 10 mg via INTRAVENOUS
  Filled 2023-02-17: qty 10

## 2023-02-17 MED ORDER — POTASSIUM CHLORIDE IN NACL 20-0.9 MEQ/L-% IV SOLN
Freq: Once | INTRAVENOUS | Status: AC
  Filled 2023-02-17: qty 1000

## 2023-02-17 MED ORDER — SODIUM CHLORIDE 0.9 % IV SOLN: Freq: Once | INTRAVENOUS | Status: AC

## 2023-02-17 MED ORDER — PALONOSETRON HCL INJECTION 0.25 MG/5ML
0.2500 mg | Freq: Once | INTRAVENOUS | Status: AC
  Administered 2023-02-17: 0.25 mg via INTRAVENOUS
  Filled 2023-02-17: qty 5

## 2023-02-17 MED ORDER — SODIUM CHLORIDE 0.9% FLUSH
10.0000 mL | INTRAVENOUS | Status: DC | PRN
  Administered 2023-02-17: 10 mL

## 2023-02-17 MED ORDER — SODIUM CHLORIDE 0.9 % IV SOLN
200.0000 mg | Freq: Once | INTRAVENOUS | Status: AC
  Administered 2023-02-17: 200 mg via INTRAVENOUS
  Filled 2023-02-17: qty 200

## 2023-02-17 MED ORDER — SODIUM CHLORIDE 0.9 % IV SOLN
150.0000 mg | Freq: Once | INTRAVENOUS | Status: AC
  Administered 2023-02-17: 150 mg via INTRAVENOUS
  Filled 2023-02-17: qty 150

## 2023-02-17 NOTE — Patient Instructions (Signed)
Iron Sucrose Injection What is this medication? IRON SUCROSE (EYE ern SOO krose) treats low levels of iron (iron deficiency anemia) in people with kidney disease. Iron is a mineral that plays an important role in making red blood cells, which carry oxygen from your lungs to the rest of your body. This medicine may be used for other purposes; ask your health care provider or pharmacist if you have questions. COMMON BRAND NAME(S): Venofer What should I tell my care team before I take this medication? They need to know if you have any of these conditions: Anemia not caused by low iron levels Heart disease High levels of iron in the blood Kidney disease Liver disease An unusual or allergic reaction to iron, other medications, foods, dyes, or preservatives Pregnant or trying to get pregnant Breastfeeding How should I use this medication? This medication is for infusion into a vein. It is given in a hospital or clinic setting. Talk to your care team about the use of this medication in children. While this medication may be prescribed for children as young as 2 years for selected conditions, precautions do apply. Overdosage: If you think you have taken too much of this medicine contact a poison control center or emergency room at once. NOTE: This medicine is only for you. Do not share this medicine with others. What if I miss a dose? Keep appointments for follow-up doses. It is important not to miss your dose. Call your care team if you are unable to keep an appointment. What may interact with this medication? Do not take this medication with any of the following: Deferoxamine Dimercaprol Other iron products This medication may also interact with the following: Chloramphenicol Deferasirox This list may not describe all possible interactions. Give your health care provider a list of all the medicines, herbs, non-prescription drugs, or dietary supplements you use. Also tell them if you smoke,  drink alcohol, or use illegal drugs. Some items may interact with your medicine. What should I watch for while using this medication? Visit your care team regularly. Tell your care team if your symptoms do not start to get better or if they get worse. You may need blood work done while you are taking this medication. You may need to follow a special diet. Talk to your care team. Foods that contain iron include: whole grains/cereals, dried fruits, beans, or peas, leafy green vegetables, and organ meats (liver, kidney). What side effects may I notice from receiving this medication? Side effects that you should report to your care team as soon as possible: Allergic reactions--skin rash, itching, hives, swelling of the face, lips, tongue, or throat Low blood pressure--dizziness, feeling faint or lightheaded, blurry vision Shortness of breath Side effects that usually do not require medical attention (report to your care team if they continue or are bothersome): Flushing Headache Joint pain Muscle pain Nausea Pain, redness, or irritation at injection site This list may not describe all possible side effects. Call your doctor for medical advice about side effects. You may report side effects to FDA at 1-800-FDA-1088. Where should I keep my medication? This medication is given in a hospital or clinic. It will not be stored at home. NOTE: This sheet is a summary. It may not cover all possible information. If you have questions about this medicine, talk to your doctor, pharmacist, or health care provider.  2024 Elsevier/Gold Standard (2022-12-03 00:00:00)    [facility]  Discharge Instructions: Thank you for choosing  [facility] to provide your oncology and hematology care.   If you have a lab appointment with the Cancer Center, please go directly to the Cancer Center and check in at the registration area.   Wear comfortable clothing and  clothing appropriate for easy access to any Portacath or PICC line.   We strive to give you quality time with your provider. You may need to reschedule your appointment if you arrive late (15 or more minutes).  Arriving late affects you and other patients whose appointments are after yours.  Also, if you miss three or more appointments without notifying the office, you may be dismissed from the clinic at the provider's discretion.      For prescription refill requests, have your pharmacy contact our office and allow 72 hours for refills to be completed.    Today you received the following chemotherapy and/or immunotherapy agents cisplatin      To help prevent nausea and vomiting after your treatment, we encourage you to take your nausea medication as directed.  BELOW ARE SYMPTOMS THAT SHOULD BE REPORTED IMMEDIATELY: *FEVER GREATER THAN 100.4 F (38 C) OR HIGHER *CHILLS OR SWEATING *NAUSEA AND VOMITING THAT IS NOT CONTROLLED WITH YOUR NAUSEA MEDICATION *UNUSUAL SHORTNESS OF BREATH *UNUSUAL BRUISING OR BLEEDING *URINARY PROBLEMS (pain or burning when urinating, or frequent urination) *BOWEL PROBLEMS (unusual diarrhea, constipation, pain near the anus) TENDERNESS IN MOUTH AND THROAT WITH OR WITHOUT PRESENCE OF ULCERS (sore throat, sores in mouth, or a toothache) UNUSUAL RASH, SWELLING OR PAIN  UNUSUAL VAGINAL DISCHARGE OR ITCHING   Items with * indicate a potential emergency and should be followed up as soon as possible or go to the Emergency Department if any problems should occur.  Please show the CHEMOTHERAPY ALERT CARD or IMMUNOTHERAPY ALERT CARD at check-in to the Emergency Department and triage nurse.  Should you have questions after your visit or need to cancel or reschedule your appointment, please contact [facility]  Dept: [phone]  and follow the prompts.  Office hours are 8:00 a.m. to 4:30 p.m. Monday - Friday. Please note that voicemails  left after 4:00 p.m. may not be returned until the following business day.  We are closed weekends and major holidays. You have access to a nurse at all times for urgent questions. Please call the main number to the clinic Dept: [phone] and follow the prompts.   For any non-urgent questions, you may also contact your provider using MyChart. We now offer e-Visits for anyone 16 and older to request care online for non-urgent symptoms. For details visit [email or web address].   Also download the MyChart app! Go to the app store, search "MyChart", open the app, select [facility], and log in with your MyChart username and password.

## 2023-02-18 ENCOUNTER — Other Ambulatory Visit (HOSPITAL_COMMUNITY): Payer: BC Managed Care – PPO

## 2023-02-18 ENCOUNTER — Ambulatory Visit
Admission: RE | Admit: 2023-02-18 | Discharge: 2023-02-18 | Disposition: A | Discharge status: Home / Self Care | Payer: BC Managed Care – PPO | Source: Ambulatory Visit | Attending: Radiation Oncology | Admitting: Radiation Oncology

## 2023-02-18 ENCOUNTER — Other Ambulatory Visit: Payer: Self-pay

## 2023-02-18 DIAGNOSIS — C531 Malignant neoplasm of exocervix: Secondary | ICD-10-CM | POA: Diagnosis not present

## 2023-02-18 LAB — RAD ONC ARIA SESSION SUMMARY
Course Elapsed Days: 29
Plan Fractions Treated to Date: 21
Plan Prescribed Dose Per Fraction: 1.8 Gy
Plan Total Fractions Prescribed: 25
Plan Total Prescribed Dose: 45 Gy
Reference Point Dosage Given to Date: 37.8 Gy
Reference Point Session Dosage Given: 1.8 Gy
Session Number: 21

## 2023-02-21 ENCOUNTER — Other Ambulatory Visit: Payer: Self-pay

## 2023-02-21 ENCOUNTER — Ambulatory Visit
Admission: RE | Admit: 2023-02-21 | Discharge: 2023-02-21 | Disposition: A | Discharge status: Home / Self Care | Payer: BC Managed Care – PPO | Source: Ambulatory Visit | Attending: Radiation Oncology | Admitting: Radiation Oncology

## 2023-02-21 ENCOUNTER — Inpatient Hospital Stay: Payer: BC Managed Care – PPO

## 2023-02-21 DIAGNOSIS — C531 Malignant neoplasm of exocervix: Secondary | ICD-10-CM | POA: Diagnosis not present

## 2023-02-21 DIAGNOSIS — D508 Other iron deficiency anemias: Secondary | ICD-10-CM

## 2023-02-21 DIAGNOSIS — C539 Malignant neoplasm of cervix uteri, unspecified: Secondary | ICD-10-CM

## 2023-02-21 DIAGNOSIS — N939 Abnormal uterine and vaginal bleeding, unspecified: Secondary | ICD-10-CM

## 2023-02-21 LAB — COMPREHENSIVE METABOLIC PANEL
ALT: 7 U/L (ref 0–44)
AST: 12 U/L — ABNORMAL LOW (ref 15–41)
Albumin: 3.3 g/dL — ABNORMAL LOW (ref 3.5–5.0)
Alkaline Phosphatase: 74 U/L (ref 38–126)
Anion gap: 7 (ref 5–15)
BUN: 16 mg/dL (ref 6–20)
CO2: 31 mmol/L (ref 22–32)
Calcium: 7.8 mg/dL — ABNORMAL LOW (ref 8.9–10.3)
Chloride: 102 mmol/L (ref 98–111)
Creatinine, Ser: 0.76 mg/dL (ref 0.44–1.00)
GFR, Estimated: >60 mL/min (ref >60)
Glucose, Bld: 93 mg/dL (ref 70–99)
Potassium: 3.5 mmol/L (ref 3.5–5.1)
Sodium: 140 mmol/L (ref 135–145)
Total Bilirubin: 0.2 mg/dL — ABNORMAL LOW (ref 0.3–1.2)
Total Protein: 6.1 g/dL — ABNORMAL LOW (ref 6.5–8.1)

## 2023-02-21 LAB — IRON AND IRON BINDING CAPACITY (CC-WL,HP ONLY)
Iron: 114 ug/dL (ref 28–170)
Saturation Ratios: 64 % — ABNORMAL HIGH (ref 10.4–31.8)
TIBC: 179 ug/dL — ABNORMAL LOW (ref 250–450)
UIBC: 65 ug/dL — ABNORMAL LOW (ref 148–442)

## 2023-02-21 LAB — CBC WITH DIFFERENTIAL (CANCER CENTER ONLY)
Abs Immature Granulocytes: 0.01 10*3/uL (ref 0.00–0.07)
Basophils Absolute: 0 10*3/uL (ref 0.0–0.1)
Basophils Relative: 0 %
Eosinophils Absolute: 0 10*3/uL (ref 0.0–0.5)
Eosinophils Relative: 1 %
HCT: 27.6 % — ABNORMAL LOW (ref 36.0–46.0)
Hemoglobin: 9.3 g/dL — ABNORMAL LOW (ref 12.0–15.0)
Immature Granulocytes: 1 %
Lymphocytes Relative: 13 %
Lymphs Abs: 0.2 10*3/uL — ABNORMAL LOW (ref 0.7–4.0)
MCH: 30.6 pg (ref 26.0–34.0)
MCHC: 33.7 g/dL (ref 30.0–36.0)
MCV: 90.8 fL (ref 80.0–100.0)
Monocytes Absolute: 0.2 10*3/uL (ref 0.1–1.0)
Monocytes Relative: 9 %
Neutro Abs: 1.5 10*3/uL — ABNORMAL LOW (ref 1.7–7.7)
Neutrophils Relative %: 76 %
Platelet Count: 198 10*3/uL (ref 150–400)
RBC: 3.04 MIL/uL — ABNORMAL LOW (ref 3.87–5.11)
RDW: 16.2 % — ABNORMAL HIGH (ref 11.5–15.5)
WBC Count: 1.9 10*3/uL — ABNORMAL LOW (ref 4.0–10.5)
nRBC: 0 % (ref 0.0–0.2)

## 2023-02-21 LAB — RAD ONC ARIA SESSION SUMMARY
Course Elapsed Days: 32
Plan Fractions Treated to Date: 22
Plan Prescribed Dose Per Fraction: 1.8 Gy
Plan Total Fractions Prescribed: 25
Plan Total Prescribed Dose: 45 Gy
Reference Point Dosage Given to Date: 39.6 Gy
Reference Point Session Dosage Given: 1.8 Gy
Session Number: 22

## 2023-02-21 LAB — PREGNANCY, URINE: Preg Test, Ur: NEGATIVE

## 2023-02-21 LAB — SAMPLE TO BLOOD BANK

## 2023-02-21 LAB — MAGNESIUM: Magnesium: 1.1 mg/dL — ABNORMAL LOW (ref 1.7–2.4)

## 2023-02-22 ENCOUNTER — Other Ambulatory Visit: Payer: Self-pay

## 2023-02-22 ENCOUNTER — Ambulatory Visit
Admission: RE | Admit: 2023-02-22 | Discharge: 2023-02-22 | Disposition: A | Discharge status: Home / Self Care | Payer: BC Managed Care – PPO | Source: Ambulatory Visit | Attending: Radiation Oncology | Admitting: Radiation Oncology

## 2023-02-22 ENCOUNTER — Encounter: Payer: Self-pay | Admitting: Hematology and Oncology

## 2023-02-22 ENCOUNTER — Ambulatory Visit: Payer: BC Managed Care – PPO

## 2023-02-22 ENCOUNTER — Inpatient Hospital Stay (HOSPITAL_BASED_OUTPATIENT_CLINIC_OR_DEPARTMENT_OTHER): Payer: BC Managed Care – PPO | Admitting: Hematology and Oncology

## 2023-02-22 ENCOUNTER — Other Ambulatory Visit: Payer: Self-pay | Admitting: Hematology and Oncology

## 2023-02-22 ENCOUNTER — Other Ambulatory Visit (HOSPITAL_COMMUNITY): Payer: Self-pay

## 2023-02-22 VITALS — BP 141/87 | HR 70 | Temp 98.0°F | Resp 18 | Ht 63.0 in | Wt 157.6 lb

## 2023-02-22 DIAGNOSIS — N888 Other specified noninflammatory disorders of cervix uteri: Secondary | ICD-10-CM

## 2023-02-22 DIAGNOSIS — N939 Abnormal uterine and vaginal bleeding, unspecified: Secondary | ICD-10-CM

## 2023-02-22 DIAGNOSIS — D61818 Other pancytopenia: Secondary | ICD-10-CM | POA: Diagnosis not present

## 2023-02-22 DIAGNOSIS — G893 Neoplasm related pain (acute) (chronic): Secondary | ICD-10-CM | POA: Diagnosis not present

## 2023-02-22 DIAGNOSIS — H938X9 Other specified disorders of ear, unspecified ear: Secondary | ICD-10-CM

## 2023-02-22 DIAGNOSIS — C531 Malignant neoplasm of exocervix: Secondary | ICD-10-CM

## 2023-02-22 LAB — URINALYSIS, COMPLETE (UACMP) WITH MICROSCOPIC
Bilirubin Urine: NEGATIVE
Glucose, UA: 50 mg/dL — AB
Ketones, ur: 5 mg/dL — AB
Nitrite: NEGATIVE
Protein, ur: 100 mg/dL — AB
RBC / HPF: >50 RBC/hpf (ref 0–5)
Specific Gravity, Urine: 1.027 (ref 1.005–1.030)
WBC, UA: >50 WBC/hpf (ref 0–5)
pH: 5 (ref 5.0–8.0)

## 2023-02-22 LAB — RAD ONC ARIA SESSION SUMMARY
Course Elapsed Days: 33
Plan Fractions Treated to Date: 23
Plan Prescribed Dose Per Fraction: 1.8 Gy
Plan Total Fractions Prescribed: 25
Plan Total Prescribed Dose: 45 Gy
Reference Point Dosage Given to Date: 41.4 Gy
Reference Point Session Dosage Given: 1.8 Gy
Session Number: 23

## 2023-02-22 MED ORDER — OXYCODONE HCL 5 MG PO TABS
5.0000 mg | ORAL_TABLET | ORAL | 0 refills | Status: DC | PRN
  Filled 2023-02-22: qty 60, 10d supply, fill #0

## 2023-02-23 ENCOUNTER — Encounter: Payer: Self-pay | Admitting: Hematology and Oncology

## 2023-02-23 ENCOUNTER — Other Ambulatory Visit: Payer: Self-pay

## 2023-02-23 ENCOUNTER — Ambulatory Visit
Admission: RE | Admit: 2023-02-23 | Discharge: 2023-02-23 | Disposition: A | Discharge status: Home / Self Care | Payer: BC Managed Care – PPO | Source: Ambulatory Visit | Attending: Radiation Oncology | Admitting: Radiation Oncology

## 2023-02-23 DIAGNOSIS — H938X9 Other specified disorders of ear, unspecified ear: Secondary | ICD-10-CM | POA: Insufficient documentation

## 2023-02-23 DIAGNOSIS — D61818 Other pancytopenia: Secondary | ICD-10-CM | POA: Insufficient documentation

## 2023-02-23 DIAGNOSIS — C531 Malignant neoplasm of exocervix: Secondary | ICD-10-CM | POA: Diagnosis not present

## 2023-02-23 LAB — RAD ONC ARIA SESSION SUMMARY
Course Elapsed Days: 34
Plan Fractions Treated to Date: 24
Plan Prescribed Dose Per Fraction: 1.8 Gy
Plan Total Fractions Prescribed: 25
Plan Total Prescribed Dose: 45 Gy
Reference Point Dosage Given to Date: 43.2 Gy
Reference Point Session Dosage Given: 1.8 Gy
Session Number: 24

## 2023-02-23 MED FILL — Fosaprepitant Dimeglumine For IV Infusion 150 MG (Base Eq): INTRAVENOUS | Qty: 5 | Status: AC

## 2023-02-23 MED FILL — Dexamethasone Sodium Phosphate Inj 100 MG/10ML: INTRAMUSCULAR | Qty: 1 | Status: AC

## 2023-02-23 NOTE — Assessment & Plan Note (Signed)
She tolerated treatment well but has developed pancytopenia and very low magnesium level Will proceed with treatment with additional IV magnesium along with oral supplement I plan to reduce cisplatin dose a bit due to ototoxicity I will see her again next week for further follow-up

## 2023-02-23 NOTE — Assessment & Plan Note (Signed)
Due to chemo Will reduce dose

## 2023-02-23 NOTE — Progress Notes (Signed)
[facility] OFFICE PROGRESS NOTE  Patient Care Team: Patient, No Pcp Per as PCP - General (General Practice)  ASSESSMENT & PLAN:  Cervical cancer (HCC) She tolerated treatment well but has developed pancytopenia and very low magnesium level Will proceed with treatment with additional IV magnesium along with oral supplement I plan to reduce cisplatin dose a bit due to ototoxicity I will see her again next week for further follow-up  Pancytopenia, acquired (HCC) Due to treatment Iron studies are adequate and will stop IV iron  Hypomagnesemia Likely caused by cisplatin She will get additional IV magnesium to cycle and oral magnesium replacement as well  Cancer associated pain She denies excessive pain She will continue pain medicine as needed I refilled her prescription  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   All questions were answered. The patient knows to call the clinic with any problems, questions or concerns. The total time spent in the appointment was 40 minutes encounter with patients including review of chart and various tests results, discussions about plan of care and coordination of care plan   Artis Delay, MD 02/23/2023 10:00 AM  INTERVAL HISTORY: Please see below for problem oriented charting. she returns for treatment follow-up She has mild tinnitus, no neuropathy Denies vaginal bleeding Pain control is stable No nausea We discussed test results  REVIEW OF SYSTEMS:   Constitutional: Denies fevers, chills or abnormal weight loss Eyes: Denies blurriness of vision Ears, nose, mouth, throat, and face: Denies mucositis or sore throat Respiratory: Denies cough, dyspnea or wheezes Cardiovascular: Denies palpitation, chest discomfort or lower extremity swelling Gastrointestinal:  Denies nausea, heartburn or change in bowel habits Skin: Denies abnormal skin rashes Lymphatics:  Denies new lymphadenopathy or easy bruising Neurological:Denies numbness, tingling or new weaknesses Behavioral/Psych: Mood is stable, no new changes  All other systems were reviewed with the patient and are negative.  I have reviewed the past medical history, past surgical history, social history and family history with the patient and they are unchanged from previous note.  ALLERGIES:  has No Known Allergies.  MEDICATIONS:  Current Outpatient Medications  Medication Sig Dispense Refill   lidocaine-prilocaine (EMLA) cream Apply 1 Application topically as needed. Apply to port site 1 hour to 30 minutes before port is accessed 30 g 1   magnesium oxide (MAG-OX) 400 (240 Mg) MG tablet Take 1 tablet (400 mg total) by mouth daily. 30 tablet 1   ondansetron (ZOFRAN) 8 MG tablet Take 1 tablet (8 mg total) by mouth every 8 (eight) hours as needed for nausea or vomiting. Start on the third day after cisplatin. 30 tablet 1   oxyCODONE (OXY IR/ROXICODONE) 5 MG immediate release tablet Take 1 tablet (5 mg) by mouth every 4 hours as needed for severe pain. 60 tablet 0   prochlorperazine (COMPAZINE) 10 MG tablet TAKE 1 TABLET(10 MG) BY MOUTH EVERY 6 HOURS AS NEEDED FOR NAUSEA OR VOMITING 30 tablet 1   No current facility-administered medications for this visit.    SUMMARY OF ONCOLOGIC HISTORY: Oncology History Overview Note  PD-L1 CPS 1%   Cervical cancer (HCC)  11/24/2022 Initial Diagnosis   The patient presented recently to the emergency department with both vaginal bleeding and rectal bleeding.  Exam in the emergency department showed a 6 cm irregular cervical mass.     11/24/2022 Imaging   CT pelvis 1. Heterogeneous cervical mass is highly worrisome for cervical carcinoma. Significant  border of contact with the adjacent rectum. Difficult to exclude invasion. 2. Locule of air within the cervical mass may be due to necrosis or extension from the vagina. Difficult to exclude a fistula to the  rectum.   11/24/2022 Imaging   US pelvis 1. Large, approximately 7.6 cm vascular soft tissue mass of the lower uterine segment. This seems inseparable from the cervix and Cervical Carcinoma is not excluded. Alternatively this might be a large lower uterine fibroid. Recommend direct visualization of the cervix.   2. Elsewhere the uterus and endometrium are within normal limits. Normal ovaries and no free fluid.   11/26/2022 Pathology Results   SURGICAL PATHOLOGY  CASE: [id number]  PATIENT: Lorraine Yates  Surgical Pathology Report   FINAL MICROSCOPIC DIAGNOSIS:   A. 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  See comment.   COMMENT:   - Morphologic evaluation and p16 immunohistochemical stain reveal superficial squamous mucosa with findings consistent with high-grade squamous intraepithelial lesion.  The patient's clinical history of a cervical mass is noted, and a deeper more invasive process cannot be excluded.  Clinical and imaging correlation is recommended.      11/29/2022 Initial Diagnosis   Cervical cancer (HCC)   12/02/2022 Cancer Staging   Staging form: Cervix Uteri, AJCC Version 9 - Clinical stage from 12/02/2022: Stage IIIC1 (cT1b3, cN1, cM0) - Signed by Artis Delay, MD on 12/15/2022 Stage prefix: Initial diagnosis   12/15/2022 PET scan   1. Hypermetabolic cervical mass consistent with known cervical carcinoma. 2. There is a mildly FDG avid left pelvic sidewall lymph node which is equivocal for nodal metastasis. 3. No additional sites of disease identified. 4. Aortic Atherosclerosis (ICD10-I70.0).     12/16/2022 Procedure   Successful placement of a right internal jugular approach power injectable Port-A-Cath. The catheter is ready for immediate use.     12/17/2022 Pathology Results   SURGICAL PATHOLOGY  CASE: [id number]  PATIENT: Lorraine Yates  Surgical Pathology Report   Clinical History: malignant neoplasm of cervix, unspecified  site   FINAL MICROSCOPIC DIAGNOSIS:   A. 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     12/18/2022 Imaging   1. The normal cervical anatomy is completely effaced by a bulky mass measuring 7.9 x 6.9 x 5.5 cm. Mass extends into the lower uterine segment with loss of parametrial definition about the posterior 180 degrees of the upper portion of the mass. Mass remains confined to the upper third of the vagina. 2. Preserved fat planes to the adjacent bladder and rectum. No ureteral invasion or hydronephrosis. 3. Small left pelvic sidewall lymph node measuring 0.9 x 0.6 cm, previously with equivocal FDG avidity and suspicious for a small nodal metastasis. No other enlarged lymph nodes in the pelvis.     01/20/2023 - 01/20/2023 Chemotherapy   Patient is on Treatment Plan : HEAD/NECK Cisplatin (40) q7d     01/20/2023 -  Chemotherapy   Patient is on Treatment Plan : Cervical Pembrolizumab (200) q21d, cisplatin (40) q7d + XRT / pembrolizumab (400) q42d        PHYSICAL EXAMINATION: ECOG PERFORMANCE STATUS: 1 - Symptomatic but completely ambulatory  Vitals:   02/22/23 1355  BP: (!) 141/87  Pulse: 70  Resp: 18  Temp: 98 F (36.7 C)  SpO2: 100%   Filed Weights   02/22/23 1355  Weight: 157 lb 9.6 oz (  71.5 kg)    GENERAL:alert, no distress and comfortable NEURO: alert & oriented x 3 with fluent speech, no focal motor/sensory deficits  LABORATORY DATA:  I have reviewed the data as listed    Component Value Date/Time   NA 140 02/21/2023 1545   K 3.5 02/21/2023 1545   CL 102 02/21/2023 1545   CO2 31 02/21/2023 1545   GLUCOSE 93 02/21/2023 1545   BUN 16 02/21/2023 1545   CREATININE 0.76 02/21/2023 1545   CREATININE 0.77 02/14/2023 1453   CALCIUM 7.8 (L) 02/21/2023 1545   PROT 6.1 (L) 02/21/2023 1545   ALBUMIN 3.3 (L) 02/21/2023 1545   AST 12  (L) 02/21/2023 1545   ALT 7 02/21/2023 1545   ALKPHOS 74 02/21/2023 1545   BILITOT 0.2 (L) 02/21/2023 1545   GFRNONAA >60 02/21/2023 1545   GFRNONAA >60 02/14/2023 1453    No results found for: "SPEP", "UPEP"  Lab Results  Component Value Date   WBC 1.9 (L) 02/21/2023   NEUTROABS 1.5 (L) 02/21/2023   HGB 9.3 (L) 02/21/2023   HCT 27.6 (L) 02/21/2023   MCV 90.8 02/21/2023   PLT 198 02/21/2023      Chemistry      Component Value Date/Time   NA 140 02/21/2023 1545   K 3.5 02/21/2023 1545   CL 102 02/21/2023 1545   CO2 31 02/21/2023 1545   BUN 16 02/21/2023 1545   CREATININE 0.76 02/21/2023 1545   CREATININE 0.77 02/14/2023 1453      Component Value Date/Time   CALCIUM 7.8 (L) 02/21/2023 1545   ALKPHOS 74 02/21/2023 1545   AST 12 (L) 02/21/2023 1545   ALT 7 02/21/2023 1545   BILITOT 0.2 (L) 02/21/2023 1545

## 2023-02-23 NOTE — Assessment & Plan Note (Signed)
She denies excessive pain She will continue pain medicine as needed I refilled her prescription

## 2023-02-23 NOTE — Assessment & Plan Note (Signed)
 Likely caused by cisplatin She will get additional IV magnesium to cycle and oral magnesium replacement as well

## 2023-02-23 NOTE — Assessment & Plan Note (Signed)
Due to treatment Iron studies are adequate and will stop IV iron

## 2023-02-24 ENCOUNTER — Other Ambulatory Visit: Payer: Self-pay

## 2023-02-24 ENCOUNTER — Ambulatory Visit
Admission: RE | Admit: 2023-02-24 | Discharge: 2023-02-24 | Disposition: A | Discharge status: Home / Self Care | Payer: BC Managed Care – PPO | Source: Ambulatory Visit | Attending: Radiation Oncology | Admitting: Radiation Oncology

## 2023-02-24 ENCOUNTER — Inpatient Hospital Stay: Payer: BC Managed Care – PPO

## 2023-02-24 ENCOUNTER — Ambulatory Visit: Payer: BC Managed Care – PPO

## 2023-02-24 VITALS — BP 155/85 | HR 65 | Temp 98.5°F | Resp 18 | Wt 155.8 lb

## 2023-02-24 DIAGNOSIS — C531 Malignant neoplasm of exocervix: Secondary | ICD-10-CM | POA: Diagnosis not present

## 2023-02-24 LAB — RAD ONC ARIA SESSION SUMMARY
Course Elapsed Days: 35
Plan Fractions Treated to Date: 25
Plan Prescribed Dose Per Fraction: 1.8 Gy
Plan Total Fractions Prescribed: 25
Plan Total Prescribed Dose: 45 Gy
Reference Point Dosage Given to Date: 45 Gy
Reference Point Session Dosage Given: 1.8 Gy
Session Number: 25

## 2023-02-24 MED ORDER — SODIUM CHLORIDE 0.9 % IV SOLN: Freq: Once | INTRAVENOUS | Status: AC

## 2023-02-24 MED ORDER — SODIUM CHLORIDE 0.9% FLUSH
10.0000 mL | INTRAVENOUS | Status: DC | PRN
  Administered 2023-02-24: 10 mL

## 2023-02-24 MED ORDER — SODIUM CHLORIDE 0.9 % IV SOLN
10.0000 mg | Freq: Once | INTRAVENOUS | Status: AC
  Administered 2023-02-24: 10 mg via INTRAVENOUS
  Filled 2023-02-24: qty 10

## 2023-02-24 MED ORDER — MAGNESIUM SULFATE 4 GM/100ML IV SOLN
4.0000 g | Freq: Once | INTRAVENOUS | Status: AC
  Administered 2023-02-24: 4 g via INTRAVENOUS
  Filled 2023-02-24: qty 100

## 2023-02-24 MED ORDER — PALONOSETRON HCL INJECTION 0.25 MG/5ML
0.2500 mg | Freq: Once | INTRAVENOUS | Status: AC
  Administered 2023-02-24: 0.25 mg via INTRAVENOUS
  Filled 2023-02-24: qty 5

## 2023-02-24 MED ORDER — HEPARIN SOD (PORK) LOCK FLUSH 100 UNIT/ML IV SOLN
500.0000 [IU] | Freq: Once | INTRAVENOUS | Status: AC | PRN
  Administered 2023-02-24: 500 [IU]

## 2023-02-24 MED ORDER — SODIUM CHLORIDE 0.9 % IV SOLN
150.0000 mg | Freq: Once | INTRAVENOUS | Status: AC
  Administered 2023-02-24: 150 mg via INTRAVENOUS
  Filled 2023-02-24: qty 150

## 2023-02-24 MED ORDER — POTASSIUM CHLORIDE IN NACL 20-0.9 MEQ/L-% IV SOLN
Freq: Once | INTRAVENOUS | Status: AC
  Filled 2023-02-24: qty 1000

## 2023-02-24 MED ORDER — SODIUM CHLORIDE 0.9 % IV SOLN
32.0000 mg/m2 | Freq: Once | INTRAVENOUS | Status: AC
  Administered 2023-02-24: 57 mg via INTRAVENOUS
  Filled 2023-02-24: qty 57

## 2023-02-24 NOTE — Patient Instructions (Signed)
 Cisplatin Injection What is this medication? CISPLATIN (SIS pla tin) treats some types of cancer. It works by slowing down the growth of cancer cells. This medicine may be used for other purposes; ask your health care provider or pharmacist if you have questions. COMMON BRAND NAME(S): Platinol, Platinol -AQ What should I tell my care team before I take this medication? They need to know if you have any of these conditions: Eye disease, vision problems Hearing problems Kidney disease Low blood counts, such as low white cells, platelets, or red blood cells Tingling of the fingers or toes, or other nerve disorder An unusual or allergic reaction to cisplatin, carboplatin, oxaliplatin, other medications, foods, dyes, or preservatives If you or your partner are pregnant or trying to get pregnant Breast-feeding How should I use this medication? This medication is injected into a vein. It is given by your care team in a hospital or clinic setting. Talk to your care team about the use of this medication in children. Special care may be needed. Overdosage: If you think you have taken too much of this medicine contact a poison control center or emergency room at once. NOTE: This medicine is only for you. Do not share this medicine with others. What if I miss a dose? Keep appointments for follow-up doses. It is important not to miss your dose. Call your care team if you are unable to keep an appointment. What may interact with this medication? Do not take this medication with any of the following: Live virus vaccines This medication may also interact with the following: Certain antibiotics, such as amikacin, gentamicin, neomycin, polymyxin B, streptomycin, tobramycin, vancomycin Foscarnet This list may not describe all possible interactions. Give your health care provider a list of all the medicines, herbs, non-prescription drugs, or dietary supplements you use. Also tell them if you smoke, drink  alcohol, or use illegal drugs. Some items may interact with your medicine. What should I watch for while using this medication? Your condition will be monitored carefully while you are receiving this medication. You may need blood work done while taking this medication. This medication may make you feel generally unwell. This is not uncommon, as chemotherapy can affect healthy cells as well as cancer cells. Report any side effects. Continue your course of treatment even though you feel ill unless your care team tells you to stop. This medication may increase your risk of getting an infection. Call your care team for advice if you get a fever, chills, sore throat, or other symptoms of a cold or flu. Do not treat yourself. Try to avoid being around people who are sick. Avoid taking medications that contain aspirin, acetaminophen, ibuprofen, naproxen, or ketoprofen unless instructed by your care team. These medications may hide a fever. This medication may increase your risk to bruise or bleed. Call your care team if you notice any unusual bleeding. Be careful brushing or flossing your teeth or using a toothpick because you may get an infection or bleed more easily. If you have any dental work done, tell your dentist you are receiving this medication. Drink fluids as directed while you are taking this medication. This will help protect your kidneys. Call your care team if you get diarrhea. Do not treat yourself. Talk to your care team if you or your partner wish to become pregnant or think you might be pregnant. This medication can cause serious birth defects if taken during pregnancy and for 14 months after the last dose. A negative pregnancy  test is required before starting this medication. A reliable form of contraception is recommended while taking this medication and for 14 months after the last dose. Talk to your care team about effective forms of contraception. Do not father a child while taking this  medication and for 11 months after the last dose. Use a condom during sex during this time period. Do not breast-feed while taking this medication. This medication may cause infertility. Talk to your care team if you are concerned about your fertility. What side effects may I notice from receiving this medication? Side effects that you should report to your care team as soon as possible: Allergic reactions--skin rash, itching, hives, swelling of the face, lips, tongue, or throat Eye pain, change in vision, vision loss Hearing loss, ringing in ears Infection--fever, chills, cough, sore throat, wounds that don't heal, pain or trouble when passing urine, general feeling of discomfort or being unwell Kidney injury--decrease in the amount of urine, swelling of the ankles, hands, or feet Low red blood cell level--unusual weakness or fatigue, dizziness, headache, trouble breathing Painful swelling, warmth, or redness of the skin, blisters or sores at the infusion site Pain, tingling, or numbness in the hands or feet Unusual bruising or bleeding Side effects that usually do not require medical attention (report to your care team if they continue or are bothersome): Hair loss Nausea Vomiting This list may not describe all possible side effects. Call your doctor for medical advice about side effects. You may report side effects to FDA at 1-800-FDA-1088. Where should I keep my medication? This medication is given in a hospital or clinic. It will not be stored at home. NOTE: This sheet is a summary. It may not cover all possible information. If you have questions about this medicine, talk to your doctor, pharmacist, or health care provider.  2024 Elsevier/Gold Standard (2021-10-30 00:00:00)

## 2023-02-25 ENCOUNTER — Other Ambulatory Visit: Payer: Self-pay | Admitting: Radiology

## 2023-02-25 ENCOUNTER — Other Ambulatory Visit: Payer: Self-pay

## 2023-02-25 ENCOUNTER — Ambulatory Visit
Admission: RE | Admit: 2023-02-25 | Discharge: 2023-02-25 | Disposition: A | Discharge status: Home / Self Care | Payer: BC Managed Care – PPO | Source: Ambulatory Visit | Attending: Radiation Oncology | Admitting: Radiation Oncology

## 2023-02-25 ENCOUNTER — Ambulatory Visit: Payer: BC Managed Care – PPO

## 2023-02-25 ENCOUNTER — Telehealth: Payer: Self-pay

## 2023-02-25 DIAGNOSIS — N390 Urinary tract infection, site not specified: Secondary | ICD-10-CM

## 2023-02-25 DIAGNOSIS — C531 Malignant neoplasm of exocervix: Secondary | ICD-10-CM | POA: Diagnosis not present

## 2023-02-25 DIAGNOSIS — N888 Other specified noninflammatory disorders of cervix uteri: Secondary | ICD-10-CM

## 2023-02-25 LAB — RAD ONC ARIA SESSION SUMMARY
Course Elapsed Days: 36
Plan Fractions Treated to Date: 1
Plan Prescribed Dose Per Fraction: 1.8 Gy
Plan Total Fractions Prescribed: 5
Plan Total Prescribed Dose: 9 Gy
Reference Point Dosage Given to Date: 1.8 Gy
Reference Point Session Dosage Given: 1.8 Gy
Session Number: 26

## 2023-02-25 MED ORDER — NITROFURANTOIN MONOHYD MACRO 100 MG PO CAPS
100.0000 mg | ORAL_CAPSULE | Freq: Two times a day (BID) | ORAL | 0 refills | Status: DC
Start: 2023-02-25 — End: 2023-03-21

## 2023-02-25 NOTE — Telephone Encounter (Signed)
Called to make patient aware the antibiotic for UTI was sent to [facility]. Patient voiced understanding.

## 2023-02-25 NOTE — Progress Notes (Signed)
UA positive for infection. Prescription for Macrobid 100mg  BID x 5days sent to pt's pharmacy.

## 2023-02-28 ENCOUNTER — Inpatient Hospital Stay: Payer: BC Managed Care – PPO

## 2023-02-28 ENCOUNTER — Other Ambulatory Visit: Payer: Self-pay

## 2023-02-28 ENCOUNTER — Ambulatory Visit
Admission: RE | Admit: 2023-02-28 | Discharge: 2023-02-28 | Disposition: A | Discharge status: Home / Self Care | Payer: BC Managed Care – PPO | Source: Ambulatory Visit | Attending: Radiation Oncology | Admitting: Radiation Oncology

## 2023-02-28 ENCOUNTER — Encounter: Payer: Self-pay | Admitting: Hematology and Oncology

## 2023-02-28 DIAGNOSIS — N939 Abnormal uterine and vaginal bleeding, unspecified: Secondary | ICD-10-CM

## 2023-02-28 DIAGNOSIS — C531 Malignant neoplasm of exocervix: Secondary | ICD-10-CM | POA: Diagnosis not present

## 2023-02-28 DIAGNOSIS — D508 Other iron deficiency anemias: Secondary | ICD-10-CM

## 2023-02-28 LAB — CMP (CANCER CENTER ONLY)
ALT: 11 U/L (ref 0–44)
AST: 23 U/L (ref 15–41)
Albumin: 3.5 g/dL (ref 3.5–5.0)
Alkaline Phosphatase: 71 U/L (ref 38–126)
Anion gap: 6 (ref 5–15)
BUN: 12 mg/dL (ref 6–20)
CO2: 30 mmol/L (ref 22–32)
Calcium: 7.8 mg/dL — ABNORMAL LOW (ref 8.9–10.3)
Chloride: 104 mmol/L (ref 98–111)
Creatinine: 0.73 mg/dL (ref 0.44–1.00)
GFR, Estimated: >60 mL/min (ref >60)
Glucose, Bld: 97 mg/dL (ref 70–99)
Potassium: 3.5 mmol/L (ref 3.5–5.1)
Sodium: 140 mmol/L (ref 135–145)
Total Bilirubin: 0.2 mg/dL — ABNORMAL LOW (ref 0.3–1.2)
Total Protein: 6.4 g/dL — ABNORMAL LOW (ref 6.5–8.1)

## 2023-02-28 LAB — RAD ONC ARIA SESSION SUMMARY
Course Elapsed Days: 39
Plan Fractions Treated to Date: 2
Plan Prescribed Dose Per Fraction: 1.8 Gy
Plan Total Fractions Prescribed: 5
Plan Total Prescribed Dose: 9 Gy
Reference Point Dosage Given to Date: 3.6 Gy
Reference Point Session Dosage Given: 1.8 Gy
Session Number: 27

## 2023-02-28 LAB — CBC WITH DIFFERENTIAL (CANCER CENTER ONLY)
Abs Immature Granulocytes: 0.01 10*3/uL (ref 0.00–0.07)
Basophils Absolute: 0 10*3/uL (ref 0.0–0.1)
Basophils Relative: 0 %
Eosinophils Absolute: 0 10*3/uL (ref 0.0–0.5)
Eosinophils Relative: 1 %
HCT: 27.5 % — ABNORMAL LOW (ref 36.0–46.0)
Hemoglobin: 9 g/dL — ABNORMAL LOW (ref 12.0–15.0)
Immature Granulocytes: 1 %
Lymphocytes Relative: 20 %
Lymphs Abs: 0.2 10*3/uL — ABNORMAL LOW (ref 0.7–4.0)
MCH: 29.8 pg (ref 26.0–34.0)
MCHC: 32.7 g/dL (ref 30.0–36.0)
MCV: 91.1 fL (ref 80.0–100.0)
Monocytes Absolute: 0.1 10*3/uL (ref 0.1–1.0)
Monocytes Relative: 12 %
Neutro Abs: 0.8 10*3/uL — ABNORMAL LOW (ref 1.7–7.7)
Neutrophils Relative %: 66 %
Platelet Count: 169 10*3/uL (ref 150–400)
RBC: 3.02 MIL/uL — ABNORMAL LOW (ref 3.87–5.11)
RDW: 16.7 % — ABNORMAL HIGH (ref 11.5–15.5)
WBC Count: 1.2 10*3/uL — ABNORMAL LOW (ref 4.0–10.5)
nRBC: 0 % (ref 0.0–0.2)

## 2023-02-28 MED ORDER — HEPARIN SOD (PORK) LOCK FLUSH 100 UNIT/ML IV SOLN
500.0000 [IU] | Freq: Once | INTRAVENOUS | Status: AC
  Administered 2023-02-28: 500 [IU]

## 2023-02-28 MED ORDER — SODIUM CHLORIDE 0.9% FLUSH
10.0000 mL | Freq: Once | INTRAVENOUS | Status: AC
  Administered 2023-02-28: 10 mL

## 2023-03-01 ENCOUNTER — Inpatient Hospital Stay: Payer: BC Managed Care – PPO | Admitting: Hematology and Oncology

## 2023-03-01 ENCOUNTER — Encounter: Payer: Self-pay | Admitting: Hematology and Oncology

## 2023-03-01 ENCOUNTER — Ambulatory Visit
Admission: RE | Admit: 2023-03-01 | Discharge: 2023-03-01 | Disposition: A | Discharge status: Home / Self Care | Payer: BC Managed Care – PPO | Source: Ambulatory Visit | Attending: Radiation Oncology | Admitting: Radiation Oncology

## 2023-03-01 ENCOUNTER — Other Ambulatory Visit: Payer: Self-pay

## 2023-03-01 VITALS — BP 157/77 | HR 65 | Temp 97.4°F | Resp 18 | Ht 63.0 in | Wt 154.4 lb

## 2023-03-01 DIAGNOSIS — R197 Diarrhea, unspecified: Secondary | ICD-10-CM

## 2023-03-01 DIAGNOSIS — D61818 Other pancytopenia: Secondary | ICD-10-CM

## 2023-03-01 DIAGNOSIS — C531 Malignant neoplasm of exocervix: Secondary | ICD-10-CM

## 2023-03-01 LAB — RAD ONC ARIA SESSION SUMMARY
Course Elapsed Days: 40
Plan Fractions Treated to Date: 3
Plan Prescribed Dose Per Fraction: 1.8 Gy
Plan Total Fractions Prescribed: 5
Plan Total Prescribed Dose: 9 Gy
Reference Point Dosage Given to Date: 5.4 Gy
Reference Point Session Dosage Given: 1.8 Gy
Session Number: 28

## 2023-03-01 LAB — T4: T4, Total: 7.5 ug/dL (ref 4.5–12.0)

## 2023-03-01 LAB — TSH: TSH: 0.498 u[IU]/mL (ref 0.350–4.500)

## 2023-03-01 NOTE — Assessment & Plan Note (Signed)
She has worsening pancytopenia due to treatment She is on antibiotics Will defer her treatment until end of next week

## 2023-03-01 NOTE — Assessment & Plan Note (Signed)
She has completed her weekly cisplatin chemotherapy, complicated by diarrhea, infection and pancytopenia I will delay the start date of her next cycle of immunotherapy until end of next week Continue supportive care

## 2023-03-01 NOTE — Assessment & Plan Note (Signed)
This is likely due to side effects of treatment I recommend the patient to hold magnesium supplement

## 2023-03-01 NOTE — Progress Notes (Signed)
[facility] OFFICE PROGRESS NOTE  Patient Care Team: Patient, No Pcp Per as PCP - General (General Practice)  ASSESSMENT & PLAN:  Cervical cancer (HCC) She has completed her weekly cisplatin chemotherapy, complicated by diarrhea, infection and pancytopenia I will delay the start date of her next cycle of immunotherapy until end of next week Continue supportive care  Pancytopenia, acquired [facility]) She has worsening pancytopenia due to treatment She is on antibiotics Will defer her treatment until end of next week  Diarrhea This is likely due to side effects of treatment I recommend the patient to hold magnesium supplement  Hypomagnesemia She had history of hypomagnesemia I plan to recheck magnesium level next week  Orders Placed This Encounter  Procedures   CBC with Differential (Cancer Center Only)    Standing Status:   Future    Standing Expiration Date:   03/10/2024   CMP (Cancer Center only)    Standing Status:   Future    Standing Expiration Date:   03/10/2024   T4    Standing Status:   Future    Standing Expiration Date:   03/10/2024   TSH    Standing Status:   Future    Standing Expiration Date:   03/10/2024   Magnesium    Standing Status:   Standing    Number of Occurrences:   3    Standing Expiration Date:   02/29/2024    All questions were answered. The patient knows to call the clinic with any problems, questions or concerns. The total time spent in the appointment was 30 minutes encounter with patients including review of chart and various tests results, discussions about plan of care and coordination of care plan   Artis Delay, MD 03/01/2023 2:13 PM  INTERVAL HISTORY: Please see below for problem oriented charting. she returns for treatment follow-up seen prior to treatment She was started on antibiotics She had recent diarrhea She denies further vaginal spotting No fever or chills Denies abdominal pain We reviewed test results  REVIEW OF  SYSTEMS:   Constitutional: Denies fevers, chills or abnormal weight loss Eyes: Denies blurriness of vision Ears, nose, mouth, throat, and face: Denies mucositis or sore throat Respiratory: Denies cough, dyspnea or wheezes Cardiovascular: Denies palpitation, chest discomfort or lower extremity swelling GSkin: Denies abnormal skin rashes Lymphatics: Denies new lymphadenopathy or easy bruising Neurological:Denies numbness, tingling or new weaknesses Behavioral/Psych: Mood is stable, no new changes  All other systems were reviewed with the patient and are negative.  I have reviewed the past medical history, past surgical history, social history and family history with the patient and they are unchanged from previous note.  ALLERGIES:  has No Known Allergies.  MEDICATIONS:  Current Outpatient Medications  Medication Sig Dispense Refill   lidocaine-prilocaine (EMLA) cream Apply 1 Application topically as needed. Apply to port site 1 hour to 30 minutes before port is accessed 30 g 1   magnesium oxide (MAG-OX) 400 (240 Mg) MG tablet Take 1 tablet (400 mg total) by mouth daily. 30 tablet 1   nitrofurantoin, macrocrystal-monohydrate, (MACROBID) 100 MG capsule Take 1 capsule (100 mg total) by mouth 2 (two) times daily. 10 capsule 0   ondansetron (ZOFRAN) 8 MG tablet Take 1 tablet (8 mg total) by mouth every 8 (eight) hours as needed for nausea or vomiting. Start on the third day after cisplatin. 30 tablet 1   oxyCODONE (OXY IR/ROXICODONE) 5 MG immediate release tablet Take 1 tablet (5 mg) by mouth every 4  hours as needed for severe pain. 60 tablet 0   prochlorperazine (COMPAZINE) 10 MG tablet TAKE 1 TABLET(10 MG) BY MOUTH EVERY 6 HOURS AS NEEDED FOR NAUSEA OR VOMITING 30 tablet 1   No current facility-administered medications for this visit.    SUMMARY OF ONCOLOGIC HISTORY: Oncology History Overview Note  PD-L1 CPS 1%   Cervical cancer (HCC)  11/24/2022 Initial Diagnosis   The patient  presented recently to the emergency department with both vaginal bleeding and rectal bleeding.  Exam in the emergency department showed a 6 cm irregular cervical mass.     11/24/2022 Imaging   CT pelvis 1. Heterogeneous cervical mass is highly worrisome for cervical carcinoma. Significant border of contact with the adjacent rectum. Difficult to exclude invasion. 2. Locule of air within the cervical mass may be due to necrosis or extension from the vagina. Difficult to exclude a fistula to the rectum.   11/24/2022 Imaging   US pelvis 1. Large, approximately 7.6 cm vascular soft tissue mass of the lower uterine segment. This seems inseparable from the cervix and Cervical Carcinoma is not excluded. Alternatively this might be a large lower uterine fibroid. Recommend direct visualization of the cervix.   2. Elsewhere the uterus and endometrium are within normal limits. Normal ovaries and no free fluid.   11/26/2022 Pathology Results   SURGICAL PATHOLOGY  CASE: [id number]  PATIENT: Lorraine Yates  Surgical Pathology Report   FINAL MICROSCOPIC DIAGNOSIS:   A. 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  See comment.   COMMENT:   - Morphologic evaluation and p16 immunohistochemical stain reveal superficial squamous mucosa with findings consistent with high-grade squamous intraepithelial lesion.  The patient's clinical history of a cervical mass is noted, and a deeper more invasive process cannot be excluded.  Clinical and imaging correlation is recommended.      11/29/2022 Initial Diagnosis   Cervical cancer (HCC)   12/02/2022 Cancer Staging   Staging form: Cervix Uteri, AJCC Version 9 - Clinical stage from 12/02/2022: Stage IIIC1 (cT1b3, cN1, cM0) - Signed by Artis Delay, MD on 12/15/2022 Stage prefix: Initial diagnosis   12/15/2022 PET scan   1. Hypermetabolic cervical mass consistent with known cervical carcinoma. 2. There is a mildly FDG avid left pelvic  sidewall lymph node which is equivocal for nodal metastasis. 3. No additional sites of disease identified. 4. Aortic Atherosclerosis (ICD10-I70.0).     12/16/2022 Procedure   Successful placement of a right internal jugular approach power injectable Port-A-Cath. The catheter is ready for immediate use.     12/17/2022 Pathology Results   SURGICAL PATHOLOGY  CASE: [id number]  PATIENT: Lorraine Yates  Surgical Pathology Report   Clinical History: malignant neoplasm of cervix, unspecified site   FINAL MICROSCOPIC DIAGNOSIS:   A. 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     12/18/2022 Imaging   1. The normal cervical anatomy is completely effaced by a bulky mass measuring 7.9 x 6.9 x 5.5 cm. Mass extends into the lower uterine segment with loss of parametrial definition about the posterior 180 degrees of the upper portion of the mass. Mass remains confined to the upper third of the vagina. 2. Preserved fat planes to the adjacent bladder and rectum. No ureteral invasion or hydronephrosis. 3. Small left pelvic sidewall lymph node measuring 0.9 x 0.6 cm, previously with equivocal FDG avidity and suspicious for  a small nodal metastasis. No other enlarged lymph nodes in the pelvis.     01/20/2023 - 01/20/2023 Chemotherapy   Patient is on Treatment Plan : HEAD/NECK Cisplatin (40) q7d     01/20/2023 -  Chemotherapy   Patient is on Treatment Plan : Cervical Pembrolizumab (200) q21d, cisplatin (40) q7d + XRT / pembrolizumab (400) q42d        PHYSICAL EXAMINATION: ECOG PERFORMANCE STATUS: 1 - Symptomatic but completely ambulatory  There were no vitals filed for this visit. Filed Weights   03/01/23 1408  Weight: 154 lb 6.4 oz (70 kg)    GENERAL:alert, no distress and comfortable NEURO: alert & oriented x 3 with fluent speech, no focal motor/sensory  deficits  LABORATORY DATA:  I have reviewed the data as listed    Component Value Date/Time   NA 140 02/28/2023 1532   K 3.5 02/28/2023 1532   CL 104 02/28/2023 1532   CO2 30 02/28/2023 1532   GLUCOSE 97 02/28/2023 1532   BUN 12 02/28/2023 1532   CREATININE 0.73 02/28/2023 1532   CALCIUM 7.8 (L) 02/28/2023 1532   PROT 6.4 (L) 02/28/2023 1532   ALBUMIN 3.5 02/28/2023 1532   AST 23 02/28/2023 1532   ALT 11 02/28/2023 1532   ALKPHOS 71 02/28/2023 1532   BILITOT 0.2 (L) 02/28/2023 1532   GFRNONAA >60 02/28/2023 1532    No results found for: "SPEP", "UPEP"  Lab Results  Component Value Date   WBC 1.2 (L) 02/28/2023   NEUTROABS 0.8 (L) 02/28/2023   HGB 9.0 (L) 02/28/2023   HCT 27.5 (L) 02/28/2023   MCV 91.1 02/28/2023   PLT 169 02/28/2023      Chemistry      Component Value Date/Time   NA 140 02/28/2023 1532   K 3.5 02/28/2023 1532   CL 104 02/28/2023 1532   CO2 30 02/28/2023 1532   BUN 12 02/28/2023 1532   CREATININE 0.73 02/28/2023 1532      Component Value Date/Time   CALCIUM 7.8 (L) 02/28/2023 1532   ALKPHOS 71 02/28/2023 1532   AST 23 02/28/2023 1532   ALT 11 02/28/2023 1532   BILITOT 0.2 (L) 02/28/2023 1532

## 2023-03-01 NOTE — Assessment & Plan Note (Signed)
She had history of hypomagnesemia I plan to recheck magnesium level next week

## 2023-03-02 ENCOUNTER — Ambulatory Visit: Admission: RE | Admit: 2023-03-02 | Payer: BC Managed Care – PPO | Source: Ambulatory Visit

## 2023-03-02 ENCOUNTER — Other Ambulatory Visit: Payer: Self-pay

## 2023-03-02 ENCOUNTER — Ambulatory Visit: Payer: BC Managed Care – PPO

## 2023-03-02 ENCOUNTER — Encounter (HOSPITAL_BASED_OUTPATIENT_CLINIC_OR_DEPARTMENT_OTHER): Payer: Self-pay | Admitting: Radiation Oncology

## 2023-03-02 DIAGNOSIS — C531 Malignant neoplasm of exocervix: Secondary | ICD-10-CM | POA: Diagnosis not present

## 2023-03-02 LAB — RAD ONC ARIA SESSION SUMMARY
Course Elapsed Days: 41
Plan Fractions Treated to Date: 4
Plan Prescribed Dose Per Fraction: 1.8 Gy
Plan Total Fractions Prescribed: 5
Plan Total Prescribed Dose: 9 Gy
Reference Point Dosage Given to Date: 7.2 Gy
Reference Point Session Dosage Given: 1.8 Gy
Session Number: 29

## 2023-03-03 ENCOUNTER — Encounter (HOSPITAL_BASED_OUTPATIENT_CLINIC_OR_DEPARTMENT_OTHER): Payer: Self-pay | Admitting: Radiation Oncology

## 2023-03-03 ENCOUNTER — Other Ambulatory Visit: Payer: Self-pay

## 2023-03-03 ENCOUNTER — Ambulatory Visit
Admission: RE | Admit: 2023-03-03 | Discharge: 2023-03-03 | Disposition: A | Discharge status: Home / Self Care | Payer: BC Managed Care – PPO | Source: Ambulatory Visit | Attending: Radiation Oncology | Admitting: Radiation Oncology

## 2023-03-03 DIAGNOSIS — C531 Malignant neoplasm of exocervix: Secondary | ICD-10-CM | POA: Diagnosis not present

## 2023-03-03 LAB — RAD ONC ARIA SESSION SUMMARY
Course Elapsed Days: 42
Plan Fractions Treated to Date: 5
Plan Prescribed Dose Per Fraction: 1.8 Gy
Plan Total Fractions Prescribed: 5
Plan Total Prescribed Dose: 9 Gy
Reference Point Dosage Given to Date: 9 Gy
Reference Point Session Dosage Given: 1.8 Gy
Session Number: 30

## 2023-03-03 NOTE — Progress Notes (Addendum)
Spoke w/ via phone for pre-op interview--- pt Lab needs dos----    urine preg           Lab results------ recent lab results in epic dated 02-28-2023 COVID test -----patient states asymptomatic no test needed Arrive at ------- 0930 on 03-10-2023 NPO after MN NO Solid Food.  Clear liquids from MN until--- 0830 Med rec completed Medications to take morning of surgery ----- none Diabetic medication ----- n/a Patient instructed no nail polish to be worn day of surgery Patient instructed to bring photo id and insurance card day of surgery Patient aware to have Driver (ride ) / caregiver    for 24 hours after surgery -- husband, Lorraine Yates Patient Special Instructions ----- n/a Pre-Op special Instructions -----  reviewed process for tandem procedure from check-in each time though recovery and being taken to radiation oncology nurse to cancer center  Called and left message w/ Talbert Forest, Diplomatic Services operational officer for Dr Roselind Messier at cancer center, requested orders.  Patient verbalized understanding of instructions that were given at this phone interview. Patient denies shortness of breath, chest pain, fever, cough at this phone interview.

## 2023-03-04 NOTE — Radiation Completion Notes (Signed)
Patient Name: Lorraine Yates, Lorraine Yates MRN: [id number] Date of Birth: 01-29-1971 Referring Physician: Eugene Garnet, M.D. Date of Service: 2023-03-04 Radiation Oncologist: Arnette Schaumann, M.D. 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 Staging on 2022-12-02: Cervical cancer (HCC) T=cT1b3, N=cN1, M=cM0 Intent: Curative     ==========DELIVERED PLANS==========  First Treatment Date: 2023-01-20 - Last Treatment Date: 2023-03-03   Plan Name: Pelvis Site: Pelvis Technique: IMRT Mode: Photon Dose Per Fraction: 1.8 Gy Prescribed Dose (Delivered / Prescribed): 45 Gy / 45 Gy Prescribed Fxs (Delivered / Prescribed): 25 / 25   Plan Name: Pelvis_Bst Site: Pelvis Technique: 3D Mode: Photon Dose Per Fraction: 1.8 Gy Prescribed Dose (Delivered / Prescribed): 9 Gy / 9 Gy Prescribed Fxs (Delivered / Prescribed): 5 / 5     ==========ON TREATMENT VISIT DATES========== 2023-01-25, 2023-02-01, 2023-02-08, 2023-02-15, 2023-02-22, 2023-03-01     ==========UPCOMING VISITS========== 2024-09-03T11:15:00Z [facility] Antony Blackbird, MD; Antony Blackbird, MD  2024-08-29T15:15:00[facility] Antony Blackbird, MD; Antony Blackbird, MD  2024-09-20T11:20:00Z [facility] Ambulatory Surgery Antony Blackbird, MD; Antony Blackbird, MD  2024-09-09T11:15:00Z [facility] Ambulatory Surgery Antony Blackbird, MD; Antony Blackbird, MD  2024-09-26T11:15:00Z [facility] Ambulatory Surgery Antony Blackbird, MD; Antony Blackbird, MD        ==========APPENDIX - ON TREATMENT VISIT NOTES==========   See weekly On Treatment Notes in Epic for details.

## 2023-03-08 ENCOUNTER — Other Ambulatory Visit (HOSPITAL_COMMUNITY): Payer: Self-pay | Admitting: Radiation Oncology

## 2023-03-08 DIAGNOSIS — Z8541 Personal history of malignant neoplasm of cervix uteri: Secondary | ICD-10-CM

## 2023-03-09 ENCOUNTER — Other Ambulatory Visit: Payer: Self-pay

## 2023-03-09 NOTE — Progress Notes (Signed)
  Radiation Oncology         [phone] ________________________________  Name: Lorraine Yates MRN: [id number]  Date: 03/10/2023  DOB: 03-Jul-1971  CC: Patient, No Pcp Per  Carver Fila, MD  HDR BRACHYTHERAPY NOTE  DIAGNOSIS: The encounter diagnosis was Malignant neoplasm of exocervix (HCC).   Cervical biopsies consistent with HSIL; cannot exclude a deeper more invasive process    Cancer Staging  Cervical cancer [facility]) Staging form: Cervix Uteri, AJCC Version 9 - Clinical stage from 12/02/2022: Stage IIIC1 (cT1b3, cN1, cM0) - Signed by Artis Delay, MD on 12/15/2022  NARRATIVE: The patient was brought to the HDR suite. Identity was confirmed. All relevant records and images related to the planned course of therapy were reviewed. The patient freely provided informed written consent to proceed with treatment after reviewing the details related to the planned course of therapy. The consent form was witnessed and verified by the simulation staff. Then, the patient was set-up in a stable reproducible supine position for radiation therapy. The tandem ring system was accessed and fiducial markers were placed within the tandem and ring.   Simple treatment device note: On the operating room the patient had construction of her custom tandem ring system. She will be treated with a *** tandem/ring system. The patient had placement of a *** mm tandem. A cervical ring with a small shielding was used for her treatment. A rectal paddle was also part of her custom set up device.  Verification simulation note: An AP and lateral film was obtained through the pelvis area. This was compared to the patient's planning films documenting accurate position of the tandem/ring system for treatment.  High-dose-rate brachytherapy treatment note:   The remote afterloading device was accessed through catheter system and attached to the tandem ring system. Patient then proceeded to undergo her first high-dose-rate  treatment directed at the cervix. The patient was prescribed a dose of *** gray to be delivered to the mucosal surface.. Patient was treated with *** channels using *** dwell positions. Treatment time was *** seconds. The patient tolerated the procedure well. After completion of her therapy, a radiation survey was performed documenting return of the iridium source into the [employer] safe. The patient was then transferred to the nursing suite.  She then had removal of the rectal paddle followed by the tandem and ring system. The patient tolerated the removal well.  PLAN: The patient will return next week for her second high-dose-rate treatment.  ________________________________   -----------------------------------  Billie Lade, PhD, MD  This document serves as a record of services personally performed by Antony Blackbird, MD. It was created on his behalf by Neena Rhymes, a trained medical scribe. The creation of this record is based on the scribe's personal observations and the provider's statements to them. This document has been checked and approved by the attending provider.

## 2023-03-09 NOTE — H&P (View-Only) (Signed)
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  Earlier this year she was diagnosed with locally advanced, cell carcinoma of the cervix.  She completed external beam radiation therapy directed at the pelvis region along with radiosensitizing chemotherapy.  Overall she tolerated this treatment quite well.  She is now ready to proceed with brachytherapy treatments to complete her definitive course of radiation therapy.    PAST MEDICAL HISTORY:  Past Medical History:  Diagnosis Date   Acquired pancytopenia (HCC)    Anemia    History of cancer chemotherapy    cervical cancer  01-20-2023  to 02-28-2023   Hypomagnesemia    Malignant neoplasm of exocervix [facility]) 11/2022   oncologist--- dr gorsuch/  radiation oncologist--- dr Roselind Messier;  dx 05/ 2024;   chemo 01-20-2023 to 02-28-2023;   IMRT 01-20-2023 to 03-03-2023  to start high dose radiation 03-10-2023 w/ tandem   Port-A-Cath in place 12/16/2022    PAST SURGICAL HISTORY: Past Surgical History:  Procedure Laterality Date   IR IMAGING GUIDED PORT INSERTION  12/16/2022   NO PAST SURGERIES      FAMILY HISTORY:  Family History  Problem Relation Age of Onset   Alzheimer's disease Mother    Alzheimer's disease Father    Prostate cancer Father    Pancreatic cancer Neg Hx    Colon cancer Neg Hx    Breast cancer Neg Hx    Endometrial cancer Neg Hx     SOCIAL HISTORY:  Social History   Tobacco Use   Smoking status:  Every Day    Current packs/day: 0.15    Types: Cigarettes   Smokeless tobacco: Never   Tobacco comments:    03-03-2023  Socially - 1 pack per week,   per pt started smoking age 89,  and has cut down to 1 cig per day trying to quit  Vaping Use   Vaping status: Never Used  Substance Use Topics   Alcohol use: Yes    Comment: socially   Drug use: Yes    Types: Marijuana    Comment: 03-03-2023  per pt average since cancer dx once monthly (previously THC - once q 2 weeks)    ALLERGIES: No Known Allergies  MEDICATIONS:  No current facility-administered medications for this encounter.   Current Outpatient Medications  Medication Sig Dispense Refill   lidocaine-prilocaine (EMLA) cream Apply 1 Application topically as needed. Apply to port site 1 hour to 30 minutes before port is accessed 30 g 1   nitrofurantoin, macrocrystal-monohydrate, (MACROBID) 100 MG capsule Take 1 capsule (100 mg total) by mouth 2 (two) times daily. 10 capsule 0   ondansetron (ZOFRAN) 8 MG tablet Take 1 tablet (8 mg total) by mouth every 8 (eight) hours as needed for nausea or vomiting. Start on the third day after cisplatin. 30 tablet 1   oxyCODONE (OXY IR/ROXICODONE) 5 MG immediate release tablet Take 1 tablet (5 mg) by mouth every 4 hours as needed for severe pain. 60 tablet  0   prochlorperazine (COMPAZINE) 10 MG tablet TAKE 1 TABLET(10 MG) BY MOUTH EVERY 6 HOURS AS NEEDED FOR NAUSEA OR VOMITING 30 tablet 1   magnesium oxide (MAG-OX) 400 (240 Mg) MG tablet Take 1 tablet (400 mg total) by mouth daily. (Patient not taking: Reported on 03/03/2023) 30 tablet 1    REVIEW OF SYSTEMS:  As per HPI.    PHYSICAL EXAM:  height is 5\' 4"  (1.626 m) and weight is 154 lb (69.9 kg).   General: Alert and oriented, in no acute distress, accompanied by her husband HEENT: Head is normocephalic. Extraocular movements are intact.  Neck: Neck is supple, no palpable cervical or supraclavicular lymphadenopathy. Heart: Regular in rate and  rhythm with no murmurs, rubs, or gallops. Chest: Clear to auscultation bilaterally, with no rhonchi, wheezes, or rales. Abdomen: Soft, nontender, nondistended, with no rigidity or guarding. Extremities: No cyanosis or edema. Lymphatics: see Neck Exam Skin: No concerning lesions. Musculoskeletal: symmetric strength and muscle tone throughout. Neurologic: Cranial nerves II through XII are grossly intact. No obvious focalities. Speech is fluent. Coordination is intact. Psychiatric: Judgment and insight are intact. Affect is appropriate.  On pelvic examination the external genitalia were unremarkable. A speculum exam was performed. Bleeding noted with manipulation of the cervix.  On visualization  the cervix has an unusual vascular appearance.  The cervical mass protrudes halfway down the vaginal vault.  Detailed exam is not possible given the patient's intolerance of the exam.  Rectal exam is not performed today.  Cervical mass quite firm consistent with malignancy.  ECOG = 1    LABORATORY DATA:  Lab Results  Component Value Date   WBC 1.2 (L) 02/28/2023   HGB 9.0 (L) 02/28/2023   HCT 27.5 (L) 02/28/2023   MCV 91.1 02/28/2023   PLT 169 02/28/2023   NEUTROABS 0.8 (L) 02/28/2023   Lab Results  Component Value Date   NA 140 02/28/2023   K 3.5 02/28/2023   CL 104 02/28/2023   CO2 30 02/28/2023   GLUCOSE 97 02/28/2023   BUN 12 02/28/2023   CREATININE 0.73 02/28/2023   CALCIUM 7.8 (L) 02/28/2023      RADIOGRAPHY: No results found.    IMPRESSION: Locally advanced squamous cell carcinoma of the cervix.    She is now ready to proceed with brachytherapy treatments.   PLAN: She will be taken to the operating room on August 29 for exam under anesthesia and placement of brachytherapy equipment in preparation for high-dose-rate radiation treatments with iridium 192.  Plan is for the patient to receive a total of 5 high-dose-rate treatments to complete her definitive course of radiation  therapy.   ------------------------------------------------     Billie Lade, PhD, MD  This

## 2023-03-09 NOTE — H&P (View-Only) (Signed)
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  Earlier this year she was diagnosed with locally advanced, cell carcinoma of the cervix.  She completed external beam radiation therapy directed at the pelvis region along with radiosensitizing chemotherapy.  Overall she tolerated this treatment quite well.  She is now ready to proceed with brachytherapy treatments to complete her definitive course of radiation therapy.    PAST MEDICAL HISTORY:  Past Medical History:  Diagnosis Date   Acquired pancytopenia (HCC)    Anemia    History of cancer chemotherapy    cervical cancer  01-20-2023  to 02-28-2023   Hypomagnesemia    Malignant neoplasm of exocervix [facility]) 11/2022   oncologist--- dr gorsuch/  radiation oncologist--- dr Roselind Messier;  dx 05/ 2024;   chemo 01-20-2023 to 02-28-2023;   IMRT 01-20-2023 to 03-03-2023  to start high dose radiation 03-10-2023 w/ tandem   Port-A-Cath in place 12/16/2022    PAST SURGICAL HISTORY: Past Surgical History:  Procedure Laterality Date   IR IMAGING GUIDED PORT INSERTION  12/16/2022   NO PAST SURGERIES      FAMILY HISTORY:  Family History  Problem Relation Age of Onset   Alzheimer's disease Mother    Alzheimer's disease Father    Prostate cancer Father    Pancreatic cancer Neg Hx    Colon cancer Neg Hx    Breast cancer Neg Hx    Endometrial cancer Neg Hx     SOCIAL HISTORY:  Social History   Tobacco Use   Smoking status:  Every Day    Current packs/day: 0.15    Types: Cigarettes   Smokeless tobacco: Never   Tobacco comments:    03-03-2023  Socially - 1 pack per week,   per pt started smoking age 37,  and has cut down to 1 cig per day trying to quit  Vaping Use   Vaping status: Never Used  Substance Use Topics   Alcohol use: Yes    Comment: socially   Drug use: Yes    Types: Marijuana    Comment: 03-03-2023  per pt average since cancer dx once monthly (previously THC - once q 2 weeks)    ALLERGIES: No Known Allergies  MEDICATIONS:  No current facility-administered medications for this encounter.   Current Outpatient Medications  Medication Sig Dispense Refill   lidocaine-prilocaine (EMLA) cream Apply 1 Application topically as needed. Apply to port site 1 hour to 30 minutes before port is accessed 30 g 1   nitrofurantoin, macrocrystal-monohydrate, (MACROBID) 100 MG capsule Take 1 capsule (100 mg total) by mouth 2 (two) times daily. 10 capsule 0   ondansetron (ZOFRAN) 8 MG tablet Take 1 tablet (8 mg total) by mouth every 8 (eight) hours as needed for nausea or vomiting. Start on the third day after cisplatin. 30 tablet 1   oxyCODONE (OXY IR/ROXICODONE) 5 MG immediate release tablet Take 1 tablet (5 mg) by mouth every 4 hours as needed for severe pain. 60 tablet  0   prochlorperazine (COMPAZINE) 10 MG tablet TAKE 1 TABLET(10 MG) BY MOUTH EVERY 6 HOURS AS NEEDED FOR NAUSEA OR VOMITING 30 tablet 1   magnesium oxide (MAG-OX) 400 (240 Mg) MG tablet Take 1 tablet (400 mg total) by mouth daily. (Patient not taking: Reported on 03/03/2023) 30 tablet 1    REVIEW OF SYSTEMS:  As per HPI.    PHYSICAL EXAM:  height is 5\' 4"  (1.626 m) and weight is 154 lb (69.9 kg).   General: Alert and oriented, in no acute distress, accompanied by her husband HEENT: Head is normocephalic. Extraocular movements are intact.  Neck: Neck is supple, no palpable cervical or supraclavicular lymphadenopathy. Heart: Regular in rate and  rhythm with no murmurs, rubs, or gallops. Chest: Clear to auscultation bilaterally, with no rhonchi, wheezes, or rales. Abdomen: Soft, nontender, nondistended, with no rigidity or guarding. Extremities: No cyanosis or edema. Lymphatics: see Neck Exam Skin: No concerning lesions. Musculoskeletal: symmetric strength and muscle tone throughout. Neurologic: Cranial nerves II through XII are grossly intact. No obvious focalities. Speech is fluent. Coordination is intact. Psychiatric: Judgment and insight are intact. Affect is appropriate.  On pelvic examination the external genitalia were unremarkable. A speculum exam was performed. Bleeding noted with manipulation of the cervix.  On visualization  the cervix has an unusual vascular appearance.  The cervical mass protrudes halfway down the vaginal vault.  Detailed exam is not possible given the patient's intolerance of the exam.  Rectal exam is not performed today.  Cervical mass quite firm consistent with malignancy.  ECOG = 1    LABORATORY DATA:  Lab Results  Component Value Date   WBC 1.2 (L) 02/28/2023   HGB 9.0 (L) 02/28/2023   HCT 27.5 (L) 02/28/2023   MCV 91.1 02/28/2023   PLT 169 02/28/2023   NEUTROABS 0.8 (L) 02/28/2023   Lab Results  Component Value Date   NA 140 02/28/2023   K 3.5 02/28/2023   CL 104 02/28/2023   CO2 30 02/28/2023   GLUCOSE 97 02/28/2023   BUN 12 02/28/2023   CREATININE 0.73 02/28/2023   CALCIUM 7.8 (L) 02/28/2023      RADIOGRAPHY: No results found.    IMPRESSION: Locally advanced squamous cell carcinoma of the cervix.    She is now ready to proceed with brachytherapy treatments.   PLAN: She will be taken to the operating room on August 29 for exam under anesthesia and placement of brachytherapy equipment in preparation for high-dose-rate radiation treatments with iridium 192.  Plan is for the patient to receive a total of 5 high-dose-rate treatments to complete her definitive course of radiation  therapy.   ------------------------------------------------     Billie Lade, PhD, MD  This

## 2023-03-09 NOTE — H&P (Signed)
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  Earlier this year she was diagnosed with locally advanced, cell carcinoma of the cervix.  She completed external beam radiation therapy directed at the pelvis region along with radiosensitizing chemotherapy.  Overall she tolerated this treatment quite well.  She is now ready to proceed with brachytherapy treatments to complete her definitive course of radiation therapy.    PAST MEDICAL HISTORY:  Past Medical History:  Diagnosis Date   Acquired pancytopenia (HCC)    Anemia    History of cancer chemotherapy    cervical cancer  01-20-2023  to 02-28-2023   Hypomagnesemia    Malignant neoplasm of exocervix [facility]) 11/2022   oncologist--- dr gorsuch/  radiation oncologist--- dr Roselind Messier;  dx 05/ 2024;   chemo 01-20-2023 to 02-28-2023;   IMRT 01-20-2023 to 03-03-2023  to start high dose radiation 03-10-2023 w/ tandem   Port-A-Cath in place 12/16/2022    PAST SURGICAL HISTORY: Past Surgical History:  Procedure Laterality Date   IR IMAGING GUIDED PORT INSERTION  12/16/2022   NO PAST SURGERIES      FAMILY HISTORY:  Family History  Problem Relation Age of Onset   Alzheimer's disease Mother    Alzheimer's disease Father    Prostate cancer Father    Pancreatic cancer Neg Hx    Colon cancer Neg Hx    Breast cancer Neg Hx    Endometrial cancer Neg Hx     SOCIAL HISTORY:  Social History   Tobacco Use   Smoking status:  Every Day    Current packs/day: 0.15    Types: Cigarettes   Smokeless tobacco: Never   Tobacco comments:    03-03-2023  Socially - 1 pack per week,   per pt started smoking age 89,  and has cut down to 1 cig per day trying to quit  Vaping Use   Vaping status: Never Used  Substance Use Topics   Alcohol use: Yes    Comment: socially   Drug use: Yes    Types: Marijuana    Comment: 03-03-2023  per pt average since cancer dx once monthly (previously THC - once q 2 weeks)    ALLERGIES: No Known Allergies  MEDICATIONS:  No current facility-administered medications for this encounter.   Current Outpatient Medications  Medication Sig Dispense Refill   lidocaine-prilocaine (EMLA) cream Apply 1 Application topically as needed. Apply to port site 1 hour to 30 minutes before port is accessed 30 g 1   nitrofurantoin, macrocrystal-monohydrate, (MACROBID) 100 MG capsule Take 1 capsule (100 mg total) by mouth 2 (two) times daily. 10 capsule 0   ondansetron (ZOFRAN) 8 MG tablet Take 1 tablet (8 mg total) by mouth every 8 (eight) hours as needed for nausea or vomiting. Start on the third day after cisplatin. 30 tablet 1   oxyCODONE (OXY IR/ROXICODONE) 5 MG immediate release tablet Take 1 tablet (5 mg) by mouth every 4 hours as needed for severe pain. 60 tablet  0   prochlorperazine (COMPAZINE) 10 MG tablet TAKE 1 TABLET(10 MG) BY MOUTH EVERY 6 HOURS AS NEEDED FOR NAUSEA OR VOMITING 30 tablet 1   magnesium oxide (MAG-OX) 400 (240 Mg) MG tablet Take 1 tablet (400 mg total) by mouth daily. (Patient not taking: Reported on 03/03/2023) 30 tablet 1    REVIEW OF SYSTEMS:  As per HPI.    PHYSICAL EXAM:  height is 5\' 4"  (1.626 m) and weight is 154 lb (69.9 kg).   General: Alert and oriented, in no acute distress, accompanied by her husband HEENT: Head is normocephalic. Extraocular movements are intact.  Neck: Neck is supple, no palpable cervical or supraclavicular lymphadenopathy. Heart: Regular in rate and  rhythm with no murmurs, rubs, or gallops. Chest: Clear to auscultation bilaterally, with no rhonchi, wheezes, or rales. Abdomen: Soft, nontender, nondistended, with no rigidity or guarding. Extremities: No cyanosis or edema. Lymphatics: see Neck Exam Skin: No concerning lesions. Musculoskeletal: symmetric strength and muscle tone throughout. Neurologic: Cranial nerves II through XII are grossly intact. No obvious focalities. Speech is fluent. Coordination is intact. Psychiatric: Judgment and insight are intact. Affect is appropriate.  On pelvic examination the external genitalia were unremarkable. A speculum exam was performed. Bleeding noted with manipulation of the cervix.  On visualization  the cervix has an unusual vascular appearance.  The cervical mass protrudes halfway down the vaginal vault.  Detailed exam is not possible given the patient's intolerance of the exam.  Rectal exam is not performed today.  Cervical mass quite firm consistent with malignancy.  ECOG = 1    LABORATORY DATA:  Lab Results  Component Value Date   WBC 1.2 (L) 02/28/2023   HGB 9.0 (L) 02/28/2023   HCT 27.5 (L) 02/28/2023   MCV 91.1 02/28/2023   PLT 169 02/28/2023   NEUTROABS 0.8 (L) 02/28/2023   Lab Results  Component Value Date   NA 140 02/28/2023   K 3.5 02/28/2023   CL 104 02/28/2023   CO2 30 02/28/2023   GLUCOSE 97 02/28/2023   BUN 12 02/28/2023   CREATININE 0.73 02/28/2023   CALCIUM 7.8 (L) 02/28/2023      RADIOGRAPHY: No results found.    IMPRESSION: Locally advanced squamous cell carcinoma of the cervix.    She is now ready to proceed with brachytherapy treatments.   PLAN: She will be taken to the operating room on August 29 for exam under anesthesia and placement of brachytherapy equipment in preparation for high-dose-rate radiation treatments with iridium 192.  Plan is for the patient to receive a total of 5 high-dose-rate treatments to complete her definitive course of radiation  therapy.   ------------------------------------------------     Billie Lade, PhD, MD  This

## 2023-03-09 NOTE — H&P (View-Only) (Signed)
 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  Earlier this year she was diagnosed with locally advanced, cell carcinoma of the cervix.  She completed external beam radiation therapy directed at the pelvis region along with radiosensitizing chemotherapy.  Overall she tolerated this treatment quite well.  She is now ready to proceed with brachytherapy treatments to complete her definitive course of radiation therapy.    PAST MEDICAL HISTORY:  Past Medical History:  Diagnosis Date   Acquired pancytopenia (HCC)    Anemia    History of cancer chemotherapy    cervical cancer  01-20-2023  to 02-28-2023   Hypomagnesemia    Malignant neoplasm of exocervix [facility]) 11/2022   oncologist--- dr gorsuch/  radiation oncologist--- dr Roselind Messier;  dx 05/ 2024;   chemo 01-20-2023 to 02-28-2023;   IMRT 01-20-2023 to 03-03-2023  to start high dose radiation 03-10-2023 w/ tandem   Port-A-Cath in place 12/16/2022    PAST SURGICAL HISTORY: Past Surgical History:  Procedure Laterality Date   IR IMAGING GUIDED PORT INSERTION  12/16/2022   NO PAST SURGERIES      FAMILY HISTORY:  Family History  Problem Relation Age of Onset   Alzheimer's disease Mother    Alzheimer's disease Father    Prostate cancer Father    Pancreatic cancer Neg Hx    Colon cancer Neg Hx    Breast cancer Neg Hx    Endometrial cancer Neg Hx     SOCIAL HISTORY:  Social History   Tobacco Use   Smoking status:  Every Day    Current packs/day: 0.15    Types: Cigarettes   Smokeless tobacco: Never   Tobacco comments:    03-03-2023  Socially - 1 pack per week,   per pt started smoking age 89,  and has cut down to 1 cig per day trying to quit  Vaping Use   Vaping status: Never Used  Substance Use Topics   Alcohol use: Yes    Comment: socially   Drug use: Yes    Types: Marijuana    Comment: 03-03-2023  per pt average since cancer dx once monthly (previously THC - once q 2 weeks)    ALLERGIES: No Known Allergies  MEDICATIONS:  No current facility-administered medications for this encounter.   Current Outpatient Medications  Medication Sig Dispense Refill   lidocaine-prilocaine (EMLA) cream Apply 1 Application topically as needed. Apply to port site 1 hour to 30 minutes before port is accessed 30 g 1   nitrofurantoin, macrocrystal-monohydrate, (MACROBID) 100 MG capsule Take 1 capsule (100 mg total) by mouth 2 (two) times daily. 10 capsule 0   ondansetron (ZOFRAN) 8 MG tablet Take 1 tablet (8 mg total) by mouth every 8 (eight) hours as needed for nausea or vomiting. Start on the third day after cisplatin. 30 tablet 1   oxyCODONE (OXY IR/ROXICODONE) 5 MG immediate release tablet Take 1 tablet (5 mg) by mouth every 4 hours as needed for severe pain. 60 tablet  0   prochlorperazine (COMPAZINE) 10 MG tablet TAKE 1 TABLET(10 MG) BY MOUTH EVERY 6 HOURS AS NEEDED FOR NAUSEA OR VOMITING 30 tablet 1   magnesium oxide (MAG-OX) 400 (240 Mg) MG tablet Take 1 tablet (400 mg total) by mouth daily. (Patient not taking: Reported on 03/03/2023) 30 tablet 1    REVIEW OF SYSTEMS:  As per HPI.    PHYSICAL EXAM:  height is 5\' 4"  (1.626 m) and weight is 154 lb (69.9 kg).   General: Alert and oriented, in no acute distress, accompanied by her husband HEENT: Head is normocephalic. Extraocular movements are intact.  Neck: Neck is supple, no palpable cervical or supraclavicular lymphadenopathy. Heart: Regular in rate and  rhythm with no murmurs, rubs, or gallops. Chest: Clear to auscultation bilaterally, with no rhonchi, wheezes, or rales. Abdomen: Soft, nontender, nondistended, with no rigidity or guarding. Extremities: No cyanosis or edema. Lymphatics: see Neck Exam Skin: No concerning lesions. Musculoskeletal: symmetric strength and muscle tone throughout. Neurologic: Cranial nerves II through XII are grossly intact. No obvious focalities. Speech is fluent. Coordination is intact. Psychiatric: Judgment and insight are intact. Affect is appropriate.  On pelvic examination the external genitalia were unremarkable. A speculum exam was performed. Bleeding noted with manipulation of the cervix.  On visualization  the cervix has an unusual vascular appearance.  The cervical mass protrudes halfway down the vaginal vault.  Detailed exam is not possible given the patient's intolerance of the exam.  Rectal exam is not performed today.  Cervical mass quite firm consistent with malignancy.  ECOG = 1    LABORATORY DATA:  Lab Results  Component Value Date   WBC 1.2 (L) 02/28/2023   HGB 9.0 (L) 02/28/2023   HCT 27.5 (L) 02/28/2023   MCV 91.1 02/28/2023   PLT 169 02/28/2023   NEUTROABS 0.8 (L) 02/28/2023   Lab Results  Component Value Date   NA 140 02/28/2023   K 3.5 02/28/2023   CL 104 02/28/2023   CO2 30 02/28/2023   GLUCOSE 97 02/28/2023   BUN 12 02/28/2023   CREATININE 0.73 02/28/2023   CALCIUM 7.8 (L) 02/28/2023      RADIOGRAPHY: No results found.    IMPRESSION: Locally advanced squamous cell carcinoma of the cervix.    She is now ready to proceed with brachytherapy treatments.   PLAN: She will be taken to the operating room on August 29 for exam under anesthesia and placement of brachytherapy equipment in preparation for high-dose-rate radiation treatments with iridium 192.  Plan is for the patient to receive a total of 5 high-dose-rate treatments to complete her definitive course of radiation  therapy.   ------------------------------------------------     Billie Lade, PhD, MD  This

## 2023-03-10 ENCOUNTER — Ambulatory Visit (HOSPITAL_COMMUNITY)
Admission: RE | Admit: 2023-03-10 | Discharge: 2023-03-10 | Disposition: A | Discharge status: Home / Self Care | Payer: BC Managed Care – PPO | Source: Ambulatory Visit | Attending: Radiation Oncology | Admitting: Radiation Oncology

## 2023-03-10 ENCOUNTER — Ambulatory Visit
Admission: RE | Admit: 2023-03-10 | Discharge: 2023-03-10 | Disposition: A | Discharge status: Home / Self Care | Payer: BC Managed Care – PPO | Source: Ambulatory Visit | Attending: Radiation Oncology | Admitting: Radiation Oncology

## 2023-03-10 ENCOUNTER — Encounter (HOSPITAL_BASED_OUTPATIENT_CLINIC_OR_DEPARTMENT_OTHER)
Admission: RE | Disposition: A | Discharge status: Other Acute Inpatient Hospital | Payer: Self-pay | Source: Home / Self Care | Attending: Radiation Oncology

## 2023-03-10 ENCOUNTER — Other Ambulatory Visit: Payer: Self-pay

## 2023-03-10 ENCOUNTER — Ambulatory Visit (HOSPITAL_BASED_OUTPATIENT_CLINIC_OR_DEPARTMENT_OTHER)
Admission: RE | Admit: 2023-03-10 | Discharge: 2023-03-10 | Disposition: A | Discharge status: Other Acute Inpatient Hospital | Payer: BC Managed Care – PPO | Attending: Radiation Oncology | Admitting: Radiation Oncology

## 2023-03-10 ENCOUNTER — Encounter (HOSPITAL_BASED_OUTPATIENT_CLINIC_OR_DEPARTMENT_OTHER): Payer: Self-pay | Admitting: Radiation Oncology

## 2023-03-10 ENCOUNTER — Ambulatory Visit (HOSPITAL_BASED_OUTPATIENT_CLINIC_OR_DEPARTMENT_OTHER): Payer: BC Managed Care – PPO | Admitting: Anesthesiology

## 2023-03-10 VITALS — BP 126/84 | HR 71 | Resp 18

## 2023-03-10 DIAGNOSIS — C538 Malignant neoplasm of overlapping sites of cervix uteri: Secondary | ICD-10-CM

## 2023-03-10 DIAGNOSIS — C531 Malignant neoplasm of exocervix: Secondary | ICD-10-CM | POA: Insufficient documentation

## 2023-03-10 DIAGNOSIS — Z8541 Personal history of malignant neoplasm of cervix uteri: Secondary | ICD-10-CM

## 2023-03-10 DIAGNOSIS — F17210 Nicotine dependence, cigarettes, uncomplicated: Secondary | ICD-10-CM | POA: Diagnosis not present

## 2023-03-10 DIAGNOSIS — Z01818 Encounter for other preprocedural examination: Secondary | ICD-10-CM

## 2023-03-10 HISTORY — DX: Hypomagnesemia: E83.42

## 2023-03-10 HISTORY — DX: Other pancytopenia: D61.818

## 2023-03-10 HISTORY — DX: Personal history of antineoplastic chemotherapy: Z92.21

## 2023-03-10 HISTORY — PX: OPERATIVE ULTRASOUND: SHX5996

## 2023-03-10 HISTORY — PX: TANDEM RING INSERTION: SHX6199

## 2023-03-10 LAB — RAD ONC ARIA SESSION SUMMARY
Course Elapsed Days: 49
Plan Fractions Treated to Date: 1
Plan Prescribed Dose Per Fraction: 5.5 Gy
Plan Total Fractions Prescribed: 1
Plan Total Prescribed Dose: 5.5 Gy
Reference Point Dosage Given to Date: 11.2573 Gy
Reference Point Dosage Given to Date: 12.3399 Gy
Reference Point Dosage Given to Date: 5.4217 Gy
Reference Point Dosage Given to Date: 5.5 Gy
Reference Point Dosage Given to Date: 5.6512 Gy
Reference Point Session Dosage Given: 11.2573 Gy
Reference Point Session Dosage Given: 12.3399 Gy
Reference Point Session Dosage Given: 5.4217 Gy
Reference Point Session Dosage Given: 5.5 Gy
Reference Point Session Dosage Given: 5.6512 Gy
Session Number: 31

## 2023-03-10 LAB — CBC WITH DIFFERENTIAL/PLATELET
Abs Immature Granulocytes: 0.01 10*3/uL (ref 0.00–0.07)
Basophils Absolute: 0 10*3/uL (ref 0.0–0.1)
Basophils Relative: 1 %
Eosinophils Absolute: 0 10*3/uL (ref 0.0–0.5)
Eosinophils Relative: 1 %
HCT: 31.7 % — ABNORMAL LOW (ref 36.0–46.0)
Hemoglobin: 9.9 g/dL — ABNORMAL LOW (ref 12.0–15.0)
Immature Granulocytes: 1 %
Lymphocytes Relative: 18 %
Lymphs Abs: 0.4 10*3/uL — ABNORMAL LOW (ref 0.7–4.0)
MCH: 29.6 pg (ref 26.0–34.0)
MCHC: 31.2 g/dL (ref 30.0–36.0)
MCV: 94.9 fL (ref 80.0–100.0)
Monocytes Absolute: 0.3 10*3/uL (ref 0.1–1.0)
Monocytes Relative: 15 %
Neutro Abs: 1.3 10*3/uL — ABNORMAL LOW (ref 1.7–7.7)
Neutrophils Relative %: 64 %
Platelets: 235 10*3/uL (ref 150–400)
RBC: 3.34 MIL/uL — ABNORMAL LOW (ref 3.87–5.11)
RDW: 18.6 % — ABNORMAL HIGH (ref 11.5–15.5)
WBC: 2 10*3/uL — ABNORMAL LOW (ref 4.0–10.5)
nRBC: 0 % (ref 0.0–0.2)

## 2023-03-10 LAB — POCT PREGNANCY, URINE: Preg Test, Ur: NEGATIVE

## 2023-03-10 SURGERY — INSERTION, UTERINE TANDEM AND RING OR CYLINDER, FOR BRACHYTHERAPY
Anesthesia: General

## 2023-03-10 MED ORDER — LIDOCAINE 2% (20 MG/ML) 5 ML SYRINGE
INTRAMUSCULAR | Status: DC | PRN
  Administered 2023-03-10: 60 mg via INTRAVENOUS

## 2023-03-10 MED ORDER — ONDANSETRON HCL 4 MG/2ML IJ SOLN
INTRAMUSCULAR | Status: DC | PRN
  Administered 2023-03-10: 4 mg via INTRAVENOUS

## 2023-03-10 MED ORDER — WHITE PETROLATUM EX OINT
TOPICAL_OINTMENT | CUTANEOUS | Status: AC
  Filled 2023-03-10: qty 5

## 2023-03-10 MED ORDER — ONDANSETRON HCL 4 MG/2ML IJ SOLN: 4.0000 mg | Freq: Four times a day (QID) | INTRAMUSCULAR | Status: DC | PRN

## 2023-03-10 MED ORDER — FENTANYL CITRATE (PF) 100 MCG/2ML IJ SOLN
INTRAMUSCULAR | Status: DC | PRN
  Administered 2023-03-10 (×2): 50 ug via INTRAVENOUS

## 2023-03-10 MED ORDER — OXYCODONE HCL 5 MG PO TABS
ORAL_TABLET | ORAL | Status: AC
  Filled 2023-03-10: qty 1

## 2023-03-10 MED ORDER — LIDOCAINE HCL (PF) 2 % IJ SOLN
INTRAMUSCULAR | Status: AC
  Filled 2023-03-10: qty 5

## 2023-03-10 MED ORDER — DEXMEDETOMIDINE HCL IN NACL 80 MCG/20ML IV SOLN
INTRAVENOUS | Status: DC | PRN
  Administered 2023-03-10: 8 ug via INTRAVENOUS

## 2023-03-10 MED ORDER — PROPOFOL 10 MG/ML IV BOLUS
INTRAVENOUS | Status: DC | PRN
  Administered 2023-03-10: 20 mg via INTRAVENOUS
  Administered 2023-03-10: 200 mg via INTRAVENOUS

## 2023-03-10 MED ORDER — DEXAMETHASONE SODIUM PHOSPHATE 10 MG/ML IJ SOLN
INTRAMUSCULAR | Status: AC
  Filled 2023-03-10: qty 1

## 2023-03-10 MED ORDER — MIDAZOLAM HCL 5 MG/5ML IJ SOLN
INTRAMUSCULAR | Status: DC | PRN
  Administered 2023-03-10: 2 mg via INTRAVENOUS

## 2023-03-10 MED ORDER — ONDANSETRON HCL 4 MG/2ML IJ SOLN
INTRAMUSCULAR | Status: AC
  Filled 2023-03-10: qty 2

## 2023-03-10 MED ORDER — OXYCODONE HCL 5 MG PO TABS
5.0000 mg | ORAL_TABLET | Freq: Once | ORAL | Status: AC | PRN
  Administered 2023-03-10: 5 mg via ORAL

## 2023-03-10 MED ORDER — OXYCODONE HCL 5 MG/5ML PO SOLN: 5.0000 mg | Freq: Once | ORAL | Status: AC | PRN

## 2023-03-10 MED ORDER — HYDROMORPHONE HCL 1 MG/ML IJ SOLN
INTRAMUSCULAR | Status: AC
  Filled 2023-03-10: qty 1

## 2023-03-10 MED ORDER — SODIUM CHLORIDE 0.9 % IR SOLN
Status: DC | PRN
  Administered 2023-03-10: 1000 mL

## 2023-03-10 MED ORDER — PROPOFOL 10 MG/ML IV BOLUS
INTRAVENOUS | Status: AC
  Filled 2023-03-10: qty 20

## 2023-03-10 MED ORDER — HYDROMORPHONE HCL 1 MG/ML IJ SOLN
0.5000 mg | Freq: Once | INTRAMUSCULAR | Status: AC
  Administered 2023-03-10: 0.5 mg via INTRAVENOUS

## 2023-03-10 MED ORDER — FENTANYL CITRATE (PF) 100 MCG/2ML IJ SOLN
INTRAMUSCULAR | Status: AC
  Filled 2023-03-10: qty 2

## 2023-03-10 MED ORDER — KETOROLAC TROMETHAMINE 30 MG/ML IJ SOLN
INTRAMUSCULAR | Status: AC
  Filled 2023-03-10: qty 1

## 2023-03-10 MED ORDER — LACTATED RINGERS IV SOLN
INTRAVENOUS | Status: DC
  Filled 2023-03-10 (×2): qty 250

## 2023-03-10 MED ORDER — MIDAZOLAM HCL 2 MG/2ML IJ SOLN
INTRAMUSCULAR | Status: AC
  Filled 2023-03-10: qty 2

## 2023-03-10 MED ORDER — FENTANYL CITRATE (PF) 100 MCG/2ML IJ SOLN
25.0000 ug | INTRAMUSCULAR | Status: DC | PRN
  Administered 2023-03-10 (×4): 25 ug via INTRAVENOUS

## 2023-03-10 MED ORDER — LACTATED RINGERS IV SOLN
INTRAVENOUS | Status: DC
  Administered 2023-03-10: 1000 mL via INTRAVENOUS

## 2023-03-10 MED ORDER — DEXAMETHASONE SODIUM PHOSPHATE 10 MG/ML IJ SOLN
INTRAMUSCULAR | Status: DC | PRN
  Administered 2023-03-10: 10 mg via INTRAVENOUS

## 2023-03-10 MED ORDER — KETOROLAC TROMETHAMINE 30 MG/ML IJ SOLN
INTRAMUSCULAR | Status: DC | PRN
  Administered 2023-03-10: 30 mg via INTRAVENOUS

## 2023-03-10 SURGICAL SUPPLY — 20 items
BNDG CMPR 75X21 PLY HI ABS (MISCELLANEOUS)
DILATOR CANAL MILEX (MISCELLANEOUS) IMPLANT
DRSG TELFA 3X8 NADH STRL (GAUZE/BANDAGES/DRESSINGS) ×1 IMPLANT
GAUZE 4X4 16PLY ~~LOC~~+RFID DBL (SPONGE) ×2 IMPLANT
GAUZE PAD ABD 8X10 STRL (GAUZE/BANDAGES/DRESSINGS) ×1 IMPLANT
GAUZE STRETCH 2X75IN STRL (MISCELLANEOUS) IMPLANT
GLOVE BIO SURGEON STRL SZ7.5 (GLOVE) ×2 IMPLANT
GOWN STRL REUS W/TWL LRG LVL3 (GOWN DISPOSABLE) ×1 IMPLANT
HOLDER FOLEY CATH W/STRAP (MISCELLANEOUS) ×1 IMPLANT
IV NS 1000ML (IV SOLUTION) ×1
IV NS 1000ML BAXH (IV SOLUTION) ×1 IMPLANT
IV SET ADMIN PUMP GEMINI W/NLD (IV SETS) ×1 IMPLANT
KIT TURNOVER CYSTO (KITS) ×1 IMPLANT
MAT PREVALON FULL STRYKER (MISCELLANEOUS) ×1 IMPLANT
PACK VAGINAL MINOR WOMEN LF (CUSTOM PROCEDURE TRAY) ×1 IMPLANT
PACKING VAGINAL (PACKING) IMPLANT
SLEEVE SCD COMPRESS KNEE MED (STOCKING) ×1 IMPLANT
TOWEL OR 17X24 6PK STRL BLUE (TOWEL DISPOSABLE) ×1 IMPLANT
TRAY FOLEY W/BAG SLVR 14FR LF (SET/KITS/TRAYS/PACK) ×1 IMPLANT
WATER STERILE IRR 500ML POUR (IV SOLUTION) ×1 IMPLANT

## 2023-03-10 NOTE — Transfer of Care (Signed)
Immediate Anesthesia Transfer of Care Note  Patient: Lorraine Yates  Procedure(s) Performed: TANDEM RING INSERTION OPERATIVE ULTRASOUND  Patient Location: PACU  Anesthesia Type:General  Level of Consciousness: awake, alert , oriented, and patient cooperative  Airway & Oxygen Therapy: Patient Spontanous Breathing  Post-op Assessment: Report given to RN and Post -op Vital signs reviewed and stable  Post vital signs: Reviewed and stable  Last Vitals:  Vitals Value Taken Time  BP 135/89 03/10/23 1212  Temp 36.8 C 03/10/23 1212  Pulse 85 03/10/23 1213  Resp 11 03/10/23 1213  SpO2 99 % 03/10/23 1213    Last Pain:  Vitals:   03/10/23 0831  TempSrc: Oral  PainSc: 0-No pain      Patients Stated Pain Goal: 7 (03/10/23 0831)  Complications: No notable events documented.

## 2023-03-10 NOTE — Patient Instructions (Signed)
IMMEDIATELY FOLLOWING SURGERY: Do not drive or operate machinery for the first twenty four hours after surgery. Do not make any important decisions for twenty four hours after surgery or while taking narcotic pain medications or sedatives. If you develop intractable nausea and vomiting or a severe headache please notify your doctor immediately.   FOLLOW-UP: You do not need to follow up with anesthesia unless specifically instructed to do so.   WOUND CARE INSTRUCTIONS (if applicable): Expect some mild vaginal bleeding, but if large amount of bleeding occurs please contact Dr. Roselind Messier at [phone] or the Radiation On-Call physician. Call for any fever greater than 101.0 degrees or increasing vaginal//abdominal pain or trouble urinating.   QUESTIONS?: Please feel free to call your physician or the hospital operator if you have any questions, and they will be happy to assist you. Resume all medications: as listed on your after visit summary. Your next appointment is:  Future Appointments  Date Time Provider Department Center  03/11/2023 11:15 AM [facility] FLUSH [facility] None  03/11/2023 11:40 AM Artis Delay, MD [facility] None  03/11/2023 12:30 PM [facility] INFUSION [facility] None  03/15/2023  7:00 AM [facility] 1 [facility] [place]  03/15/2023  9:30 AM Antony Blackbird, MD [facility] None  03/15/2023 11:00 AM Antony Blackbird, MD [facility] None  03/21/2023  7:00 AM [facility]US 1 [facility]US [place]  03/21/2023 10:00 AM Antony Blackbird, MD [facility] None  03/21/2023  2:00 PM Antony Blackbird, MD [facility] None  03/31/2023  9:15 AM [facility] FLUSH [facility] None  03/31/2023  9:40 AM Artis Delay, MD [facility] None  03/31/2023 10:30 AM [facility] INFUSION [facility] None  04/01/2023  7:00 AM [facility]-US 1 [facility]US [place]  04/01/2023 10:00 AM Antony Blackbird, MD [facility] None  04/01/2023  2:00 PM Antony Blackbird, MD [facility] None  04/07/2023  7:00 AM [facility]-US 1 [facility]-US [place]  04/07/2023 10:00 AM Antony Blackbird, MD  [facility] None  04/07/2023  2:00 PM Antony Blackbird, MD [facility] None  04/22/2023 10:30 AM [facility] FLUSH [facility] None  04/22/2023 11:20 AM Artis Delay, MD [facility] None  04/22/2023 12:30 PM [facility] INFUSION [facility] None

## 2023-03-10 NOTE — Interval H&P Note (Signed)
History and Physical Interval Note:  03/10/2023 11:19 AM  Lorraine Yates  has presented today for surgery, with the diagnosis of EXOCERVIX CANCER.  The various methods of treatment have been discussed with the patient and family. After consideration of risks, benefits and other options for treatment, the patient has consented to  Procedure(s): TANDEM RING INSERTION (N/A) OPERATIVE ULTRASOUND (N/A) as a surgical intervention.  The patient's history has been reviewed, patient examined, no change in status, stable for surgery.  I have reviewed the patient's chart and labs.  Questions were answered to the patient's satisfaction.     Antony Blackbird

## 2023-03-10 NOTE — Anesthesia Preprocedure Evaluation (Signed)
Anesthesia Evaluation  Patient identified by MRN, date of birth, ID band Patient awake    Reviewed: Allergy & Precautions, H&P , NPO status , Patient's Chart, lab work & pertinent test results  Airway Mallampati: II   Neck ROM: full    Dental   Pulmonary Current Smoker   breath sounds clear to auscultation       Cardiovascular negative cardio ROS  Rhythm:regular Rate:Normal     Neuro/Psych    GI/Hepatic   Endo/Other    Renal/GU      Musculoskeletal   Abdominal   Peds  Hematology  (+) Blood dyscrasia, anemia   Anesthesia Other Findings   Reproductive/Obstetrics                             Anesthesia Physical Anesthesia Plan  ASA: 2  Anesthesia Plan: General   Post-op Pain Management:    Induction: Intravenous  PONV Risk Score and Plan: 2 and Ondansetron, Dexamethasone, Midazolam and Treatment may vary due to age or medical condition  Airway Management Planned: LMA  Additional Equipment:   Intra-op Plan:   Post-operative Plan: Extubation in OR  Informed Consent: I have reviewed the patients History and Physical, chart, labs and discussed the procedure including the risks, benefits and alternatives for the proposed anesthesia with the patient or authorized representative who has indicated his/her understanding and acceptance.     Dental advisory given  Plan Discussed with: CRNA, Anesthesiologist and Surgeon  Anesthesia Plan Comments:        Anesthesia Quick Evaluation

## 2023-03-10 NOTE — Anesthesia Postprocedure Evaluation (Signed)
Anesthesia Post Note  Patient: Lorraine Yates  Procedure(s) Performed: TANDEM RING INSERTION OPERATIVE ULTRASOUND     Patient location during evaluation: PACU Anesthesia Type: General Level of consciousness: awake and alert Pain management: pain level controlled Vital Signs Assessment: post-procedure vital signs reviewed and stable Respiratory status: spontaneous breathing, nonlabored ventilation, respiratory function stable and patient connected to nasal cannula oxygen Cardiovascular status: blood pressure returned to baseline and stable Postop Assessment: no apparent nausea or vomiting Anesthetic complications: no   No notable events documented.  Last Vitals:  Vitals:   03/10/23 1228 03/10/23 1230  BP: (!) 163/85 (!) 163/85  Pulse: 77 71  Resp: 14 14  Temp:    SpO2: 97% 100%    Last Pain:  Vitals:   03/10/23 1228  TempSrc:   PainSc: 7                  [name]

## 2023-03-10 NOTE — Anesthesia Procedure Notes (Signed)
Procedure Name: LMA Insertion Date/Time: 03/10/2023 11:29 AM  Performed by: Bishop Limbo, CRNAPre-anesthesia Checklist: Patient identified, Emergency Drugs available, Suction available and Patient being monitored Patient Re-evaluated:Patient Re-evaluated prior to induction Oxygen Delivery Method: Circle System Utilized Preoxygenation: Pre-oxygenation with 100% oxygen Induction Type: IV induction Ventilation: Mask ventilation without difficulty LMA: LMA inserted LMA Size: 4.0 Number of attempts: 1 Airway Equipment and Method: Bite block Placement Confirmation: positive ETCO2 Tube secured with: Tape Dental Injury: Teeth and Oropharynx as per pre-operative assessment

## 2023-03-10 NOTE — Op Note (Signed)
03/10/2023  12:33 PM  PATIENT:  Lorraine Yates  52 y.o. female  PRE-OPERATIVE DIAGNOSIS:  EXOCERVIX CANCER  POST-OPERATIVE DIAGNOSIS:  EXOCERVIX CANCER  PROCEDURE:  Procedure(s): TANDEM RING INSERTION (N/A) OPERATIVE ULTRASOUND (N/A)  SURGEON:  Surgeons and Role:    * Antony Blackbird, MD - Primary  PHYSICIAN ASSISTANT:   ASSISTANTS: none   ANESTHESIA:   general  EBL:  1 mL   BLOOD ADMINISTERED:none  DRAINS: Urinary Catheter (Foley)   LOCAL MEDICATIONS USED:  NONE  SPECIMEN:  No Specimen  DISPOSITION OF SPECIMEN:  N/A  COUNTS:  YES  TOURNIQUET:  * No tourniquets in log *  DICTATION: The patient was prepped and draped in the usual sterile fashion and placed in the dorsolithotomy position.  A Foley catheter was placed and backfilled with approximately 200 cc of sterile water for ultrasound imaging.  Timeout through the procedure preoperative medications and allergies was performed.  The patient proceeded to undergo trans vaginal ultrasound for measurements of the residual cervical mass.  On ultrasound the maximum dimension was 4 cm.  Other dimensions were approximately 2 cm.  The patient then proceeded to undergo exam under anesthesia.  Significant reduction of the cervical mass was noted.  The residual tumor was flush with the right vaginal fornix.  There was some thickening noted in the right parametrial area but no obvious parametrial involvement.  Cervical mass on exam was approximately 3-1/2 x 4 cm.  Patient then underwent sounding of the uterus.  The uterus was anteverted on transvaginal ultrasound.  The uterus sounded to approximately 8 cm.  Patient then had dilation of the cervical os with Shawnie Pons dilators. the cervical os was dilated to 22 mm.  Patient then had a 60 mm 45 degree tandem with cervical sleeve  placed within the endometrial cavity and endocervical canal.  Trans abdominal ultrasound documented good placement of the treatment equipment.  A 45 degree ring with large  shielding cap was placed and attached to the tandem.  The patient then had placement of a rectal paddle posteriorly to limit high-dose radiation therapy to the rectal mucosa.  The patient tolerated the procedure well.  She was subsequently transported to recovery room in stable condition.  Later in the day the patient will be brought down to radiation oncology for planning and treatment.  She is to receive high-dose-rate brachytherapy treatment using iridium 192.  Prescription will be 5.5 Wallace Cullens to the high risk clinical target volume.  She is to receive 4 subsequent high-dose-rate treatments to complete her definitive course of radiation therapy.  PLAN OF CARE:  Transfer to radiation oncology for planning and treatment  PATIENT DISPOSITION:  PACU - hemodynamically stable.   Delay start of Pharmacological VTE agent (>24hrs) due to surgical blood loss or risk of bleeding: not applicable

## 2023-03-10 NOTE — Progress Notes (Addendum)
Patient given 0.5mg  Hydromorphone. Medication administered via IV to left hand.  Patient started to have itching and redness that started at IV insertion site and extended up past left elbow. Itching resolved quickly per patient.  Patient denies any shortness of breath or other symptoms. 02 sats 100%. MD made aware. Per MD continue to monitor patient for respiratory issues.

## 2023-03-11 ENCOUNTER — Encounter: Payer: Self-pay | Admitting: Hematology and Oncology

## 2023-03-11 ENCOUNTER — Inpatient Hospital Stay: Payer: BC Managed Care – PPO

## 2023-03-11 ENCOUNTER — Encounter (HOSPITAL_BASED_OUTPATIENT_CLINIC_OR_DEPARTMENT_OTHER): Payer: Self-pay | Admitting: Radiation Oncology

## 2023-03-11 ENCOUNTER — Inpatient Hospital Stay: Payer: BC Managed Care – PPO | Admitting: Hematology and Oncology

## 2023-03-11 VITALS — BP 126/86 | HR 79 | Resp 16

## 2023-03-11 DIAGNOSIS — D508 Other iron deficiency anemias: Secondary | ICD-10-CM | POA: Diagnosis not present

## 2023-03-11 DIAGNOSIS — C531 Malignant neoplasm of exocervix: Secondary | ICD-10-CM | POA: Diagnosis not present

## 2023-03-11 DIAGNOSIS — N939 Abnormal uterine and vaginal bleeding, unspecified: Secondary | ICD-10-CM

## 2023-03-11 DIAGNOSIS — Z720 Tobacco use: Secondary | ICD-10-CM

## 2023-03-11 LAB — CBC WITH DIFFERENTIAL (CANCER CENTER ONLY)
Abs Immature Granulocytes: 0.01 10*3/uL (ref 0.00–0.07)
Basophils Absolute: 0 10*3/uL (ref 0.0–0.1)
Basophils Relative: 0 %
Eosinophils Absolute: 0 10*3/uL (ref 0.0–0.5)
Eosinophils Relative: 0 %
HCT: 29.5 % — ABNORMAL LOW (ref 36.0–46.0)
Hemoglobin: 9.8 g/dL — ABNORMAL LOW (ref 12.0–15.0)
Immature Granulocytes: 0 %
Lymphocytes Relative: 15 %
Lymphs Abs: 0.4 10*3/uL — ABNORMAL LOW (ref 0.7–4.0)
MCH: 30.5 pg (ref 26.0–34.0)
MCHC: 33.2 g/dL (ref 30.0–36.0)
MCV: 91.9 fL (ref 80.0–100.0)
Monocytes Absolute: 0.4 10*3/uL (ref 0.1–1.0)
Monocytes Relative: 16 %
Neutro Abs: 1.9 10*3/uL (ref 1.7–7.7)
Neutrophils Relative %: 69 %
Platelet Count: 271 10*3/uL (ref 150–400)
RBC: 3.21 MIL/uL — ABNORMAL LOW (ref 3.87–5.11)
RDW: 18.1 % — ABNORMAL HIGH (ref 11.5–15.5)
WBC Count: 2.8 10*3/uL — ABNORMAL LOW (ref 4.0–10.5)
nRBC: 0 % (ref 0.0–0.2)

## 2023-03-11 LAB — CMP (CANCER CENTER ONLY)
ALT: 10 U/L (ref 0–44)
AST: 15 U/L (ref 15–41)
Albumin: 3.3 g/dL — ABNORMAL LOW (ref 3.5–5.0)
Alkaline Phosphatase: 73 U/L (ref 38–126)
Anion gap: 8 (ref 5–15)
BUN: 20 mg/dL (ref 6–20)
CO2: 28 mmol/L (ref 22–32)
Calcium: 8.6 mg/dL — ABNORMAL LOW (ref 8.9–10.3)
Chloride: 104 mmol/L (ref 98–111)
Creatinine: 0.86 mg/dL (ref 0.44–1.00)
GFR, Estimated: >60 mL/min (ref >60)
Glucose, Bld: 102 mg/dL — ABNORMAL HIGH (ref 70–99)
Potassium: 3.4 mmol/L — ABNORMAL LOW (ref 3.5–5.1)
Sodium: 140 mmol/L (ref 135–145)
Total Bilirubin: 0.1 mg/dL — ABNORMAL LOW (ref 0.3–1.2)
Total Protein: 6.6 g/dL (ref 6.5–8.1)

## 2023-03-11 LAB — MAGNESIUM: Magnesium: 1.2 mg/dL — ABNORMAL LOW (ref 1.7–2.4)

## 2023-03-11 LAB — TSH: TSH: 0.872 u[IU]/mL (ref 0.350–4.500)

## 2023-03-11 MED ORDER — SODIUM CHLORIDE 0.9 % IV SOLN: Freq: Once | INTRAVENOUS | Status: AC

## 2023-03-11 MED ORDER — SODIUM CHLORIDE 0.9% FLUSH
10.0000 mL | Freq: Once | INTRAVENOUS | Status: AC
  Administered 2023-03-11: 10 mL

## 2023-03-11 MED ORDER — HEPARIN SOD (PORK) LOCK FLUSH 100 UNIT/ML IV SOLN
500.0000 [IU] | Freq: Once | INTRAVENOUS | Status: AC | PRN
  Administered 2023-03-11: 500 [IU]

## 2023-03-11 MED ORDER — SODIUM CHLORIDE 0.9% FLUSH
10.0000 mL | INTRAVENOUS | Status: DC | PRN
  Administered 2023-03-11: 10 mL

## 2023-03-11 MED ORDER — SODIUM CHLORIDE 0.9 % IV SOLN
200.0000 mg | Freq: Once | INTRAVENOUS | Status: AC
  Administered 2023-03-11: 200 mg via INTRAVENOUS
  Filled 2023-03-11: qty 200

## 2023-03-11 NOTE — Assessment & Plan Note (Signed)
Pancytopenia is improving Observe closely She is not symptomatic

## 2023-03-11 NOTE — Patient Instructions (Signed)
Lorraine Yates  Discharge Instructions: Thank you for choosing [facility] to provide your oncology and hematology care.   If you have a lab appointment with the [facility], please go directly to the [facility] and check in at the registration area.   Wear comfortable clothing and clothing appropriate for easy access to any Portacath or PICC line.   We strive to give you quality time with your provider. You may need to reschedule your appointment if you arrive late (15 or more minutes).  Arriving late affects you and other patients whose appointments are after yours.  Also, if you miss three or more appointments without notifying the office, you may be dismissed from the clinic at the provider's discretion.      For prescription refill requests, have your pharmacy contact our office and allow 72 hours for refills to be completed.    Today you received the following chemotherapy and/or immunotherapy agents: Keytruda      To help prevent nausea and vomiting after your treatment, we encourage you to take your nausea medication as directed.  BELOW ARE SYMPTOMS THAT SHOULD BE REPORTED IMMEDIATELY: *FEVER GREATER THAN 100.4 F (38 C) OR HIGHER *CHILLS OR SWEATING *NAUSEA AND VOMITING THAT IS NOT CONTROLLED WITH YOUR NAUSEA MEDICATION *UNUSUAL SHORTNESS OF BREATH *UNUSUAL BRUISING OR BLEEDING *URINARY PROBLEMS (pain or burning when urinating, or frequent urination) *BOWEL PROBLEMS (unusual diarrhea, constipation, pain near the anus) TENDERNESS IN MOUTH AND THROAT WITH OR WITHOUT PRESENCE OF ULCERS (sore throat, sores in mouth, or a toothache) UNUSUAL RASH, SWELLING OR PAIN  UNUSUAL VAGINAL DISCHARGE OR ITCHING   Items with * indicate a potential emergency and should be followed up as soon as possible or go to the Emergency Department if any problems should occur.  Please show the CHEMOTHERAPY ALERT CARD or IMMUNOTHERAPY ALERT CARD at  check-in to the Emergency Department and triage nurse.  Should you have questions after your visit or need to cancel or reschedule your appointment, please contact [facility]  Dept: [phone]  and follow the prompts.  Office hours are 8:00 a.m. to 4:30 p.m. Monday - Friday. Please note that voicemails left after 4:00 p.m. may not be returned until the following business day.  We are closed weekends and major holidays. You have access to a nurse at all times for urgent questions. Please call the main number to the clinic Dept: [phone] and follow the prompts.   For any non-urgent questions, you may also contact your provider using MyChart. We now offer e-Visits for anyone 14 and older to request care online for non-urgent symptoms. For details visit [email or web address].   Also download the MyChart app! Go to the app store, search "MyChart", open the app, select [facility], and log in with your MyChart username and password.

## 2023-03-11 NOTE — Progress Notes (Signed)
Spoke w/ via phone for pre-op interview--- pt Lab needs dos----   no            Lab results------pt had labs done 03-10-2023 in epic CBC and negative UPT COVID test -----patient states asymptomatic no test needed Arrive at ------- 0530 on 03-15-2023 NPO after MN NO Solid Food.  Clear liquids from MN until--- 0430 Med rec completed Medications to take morning of surgery ----- none Diabetic medication ----- n/a Patient instructed no nail polish to be worn day of surgery Patient instructed to bring photo id and insurance card day of surgery Patient aware to have Driver (ride ) / caregiver    for 24 hours after surgery --- husband, Lorraine Yates Patient Special Instructions ----- n/a Pre-Op special Instructions ----- n/a Patient verbalized understanding of instructions that were given at this phone interview. Patient denies shortness of breath, chest pain, fever, cough at this phone interview.

## 2023-03-11 NOTE — Assessment & Plan Note (Signed)
This is due to cisplatin The patient is instructed to increase oral magnesium supplement

## 2023-03-11 NOTE — Assessment & Plan Note (Signed)
She denies recent bleeding except after recent treatment Overall, she is recovering well from side effects of treatment I will continue to see her in she will receive pembrolizumab every 3 weeks Starting next month, her treatment will be changed to every 6 weeks I plan to order PET/CT imaging study 3 months after completion of radiation therapy

## 2023-03-11 NOTE — Progress Notes (Signed)
[facility] OFFICE PROGRESS NOTE  Patient Care Team: Patient, No Pcp Per as PCP - General (General Practice)  ASSESSMENT & PLAN:  Cervical cancer (HCC) She denies recent bleeding except after recent treatment Overall, she is recovering well from side effects of treatment I will continue to see her in she will receive pembrolizumab every 3 weeks Starting next month, her treatment will be changed to every 6 weeks I plan to order PET/CT imaging study 3 months after completion of radiation therapy  Hypomagnesemia This is due to cisplatin The patient is instructed to increase oral magnesium supplement  Iron deficiency anemia Pancytopenia is improving Observe closely She is not symptomatic  Tobacco abuse Discussed importance of nicotine cessation and she is willing to quit on her own  No orders of the defined types were placed in this encounter.   All questions were answered. The patient knows to call the clinic with any problems, questions or concerns. The total time spent in the appointment was 30 minutes encounter with patients including review of chart and various tests results, discussions about plan of care and coordination of care plan   Artis Delay, MD 03/11/2023 4:09 PM  INTERVAL HISTORY: Please see below for problem oriented charting. she returns for treatment follow-up She is doing well with radiation Denies recent nausea, constipation or diarrhea No abnormal vaginal discharge  REVIEW OF SYSTEMS:   Constitutional: Denies fevers, chills or abnormal weight loss Eyes: Denies blurriness of vision Ears, nose, mouth, throat, and face: Denies mucositis or sore throat Respiratory: Denies cough, dyspnea or wheezes Cardiovascular: Denies palpitation, chest discomfort or lower extremity swelling Gastrointestinal:  Denies nausea, heartburn or change in bowel habits Skin: Denies abnormal skin rashes Lymphatics: Denies new lymphadenopathy or easy  bruising Neurological:Denies numbness, tingling or new weaknesses Behavioral/Psych: Mood is stable, no new changes  All other systems were reviewed with the patient and are negative.  I have reviewed the past medical history, past surgical history, social history and family history with the patient and they are unchanged from previous note.  ALLERGIES:  is allergic to hydromorphone.  MEDICATIONS:  Current Outpatient Medications  Medication Sig Dispense Refill   lidocaine-prilocaine (EMLA) cream Apply 1 Application topically as needed. Apply to port site 1 hour to 30 minutes before port is accessed 30 g 1   magnesium oxide (MAG-OX) 400 (240 Mg) MG tablet Take 1 tablet (400 mg total) by mouth daily. (Patient not taking: Reported on 03/03/2023) 30 tablet 1   nitrofurantoin, macrocrystal-monohydrate, (MACROBID) 100 MG capsule Take 1 capsule (100 mg total) by mouth 2 (two) times daily. 10 capsule 0   ondansetron (ZOFRAN) 8 MG tablet Take 1 tablet (8 mg total) by mouth every 8 (eight) hours as needed for nausea or vomiting. Start on the third day after cisplatin. 30 tablet 1   oxyCODONE (OXY IR/ROXICODONE) 5 MG immediate release tablet Take 1 tablet (5 mg) by mouth every 4 hours as needed for severe pain. 60 tablet 0   prochlorperazine (COMPAZINE) 10 MG tablet TAKE 1 TABLET(10 MG) BY MOUTH EVERY 6 HOURS AS NEEDED FOR NAUSEA OR VOMITING 30 tablet 1   No current facility-administered medications for this visit.   Facility-Administered Medications Ordered in Other Visits  Medication Dose Route Frequency Provider Last Rate Last Admin   sodium chloride flush (NS) 0.9 % injection 10 mL  10 mL Intracatheter PRN Bertis Ruddy, Marquize Seib, MD   10 mL at 03/11/23 1430    SUMMARY OF ONCOLOGIC HISTORY: Oncology History Overview  Note  PD-L1 CPS 1%   Cervical cancer (HCC)  11/24/2022 Initial Diagnosis   The patient presented recently to the emergency department with both vaginal bleeding and rectal bleeding.  Exam in  the emergency department showed a 6 cm irregular cervical mass.     11/24/2022 Imaging   CT pelvis 1. Heterogeneous cervical mass is highly worrisome for cervical carcinoma. Significant border of contact with the adjacent rectum. Difficult to exclude invasion. 2. Locule of air within the cervical mass may be due to necrosis or extension from the vagina. Difficult to exclude a fistula to the rectum.   11/24/2022 Imaging   US pelvis 1. Large, approximately 7.6 cm vascular soft tissue mass of the lower uterine segment. This seems inseparable from the cervix and Cervical Carcinoma is not excluded. Alternatively this might be a large lower uterine fibroid. Recommend direct visualization of the cervix.   2. Elsewhere the uterus and endometrium are within normal limits. Normal ovaries and no free fluid.   11/26/2022 Pathology Results   SURGICAL PATHOLOGY  CASE: [id number]  PATIENT: Huma Loy  Surgical Pathology Report   FINAL MICROSCOPIC DIAGNOSIS:   A. 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  See comment.   COMMENT:   - Morphologic evaluation and p16 immunohistochemical stain reveal superficial squamous mucosa with findings consistent with high-grade squamous intraepithelial lesion.  The patient's clinical history of a cervical mass is noted, and a deeper more invasive process cannot be excluded.  Clinical and imaging correlation is recommended.      11/29/2022 Initial Diagnosis   Cervical cancer (HCC)   12/02/2022 Cancer Staging   Staging form: Cervix Uteri, AJCC Version 9 - Clinical stage from 12/02/2022: Stage IIIC1 (cT1b3, cN1, cM0) - Signed by Artis Delay, MD on 12/15/2022 Stage prefix: Initial diagnosis   12/15/2022 PET scan   1. Hypermetabolic cervical mass consistent with known cervical carcinoma. 2. There is a mildly FDG avid left pelvic sidewall lymph node which is equivocal for nodal metastasis. 3. No additional sites of disease  identified. 4. Aortic Atherosclerosis (ICD10-I70.0).     12/16/2022 Procedure   Successful placement of a right internal jugular approach power injectable Port-A-Cath. The catheter is ready for immediate use.     12/17/2022 Pathology Results   SURGICAL PATHOLOGY  CASE: [id number]  PATIENT: Kaiah Sohm  Surgical Pathology Report   Clinical History: malignant neoplasm of cervix, unspecified site   FINAL MICROSCOPIC DIAGNOSIS:   A. 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     12/18/2022 Imaging   1. The normal cervical anatomy is completely effaced by a bulky mass measuring 7.9 x 6.9 x 5.5 cm. Mass extends into the lower uterine segment with loss of parametrial definition about the posterior 180 degrees of the upper portion of the mass. Mass remains confined to the upper third of the vagina. 2. Preserved fat planes to the adjacent bladder and rectum. No ureteral invasion or hydronephrosis. 3. Small left pelvic sidewall lymph node measuring 0.9 x 0.6 cm, previously with equivocal FDG avidity and suspicious for a small nodal metastasis. No other enlarged lymph nodes in the pelvis.     01/20/2023 - 01/20/2023 Chemotherapy   Patient is on Treatment Plan : HEAD/NECK Cisplatin (40) q7d     01/20/2023 -  Chemotherapy   Patient is on Treatment Plan : Cervical Pembrolizumab (200) q21d, cisplatin (  40) q7d + XRT / pembrolizumab (400) q42d        PHYSICAL EXAMINATION: ECOG PERFORMANCE STATUS: 1 - Symptomatic but completely ambulatory  Vitals:   03/11/23 1224  BP: 139/89  Pulse: 86  Resp: 18  Temp: 98 F (36.7 C)  SpO2: 100%   Filed Weights   03/11/23 1224  Weight: 157 lb 9.6 oz (71.5 kg)    GENERAL:alert, no distress and comfortable NEURO: alert & oriented x 3 with fluent speech, no focal motor/sensory deficits  LABORATORY DATA:  I have  reviewed the data as listed    Component Value Date/Time   NA 140 03/11/2023 1156   K 3.4 (L) 03/11/2023 1156   CL 104 03/11/2023 1156   CO2 28 03/11/2023 1156   GLUCOSE 102 (H) 03/11/2023 1156   BUN 20 03/11/2023 1156   CREATININE 0.86 03/11/2023 1156   CALCIUM 8.6 (L) 03/11/2023 1156   PROT 6.6 03/11/2023 1156   ALBUMIN 3.3 (L) 03/11/2023 1156   AST 15 03/11/2023 1156   ALT 10 03/11/2023 1156   ALKPHOS 73 03/11/2023 1156   BILITOT 0.1 (L) 03/11/2023 1156   GFRNONAA >60 03/11/2023 1156    No results found for: "SPEP", "UPEP"  Lab Results  Component Value Date   WBC 2.8 (L) 03/11/2023   NEUTROABS 1.9 03/11/2023   HGB 9.8 (L) 03/11/2023   HCT 29.5 (L) 03/11/2023   MCV 91.9 03/11/2023   PLT 271 03/11/2023      Chemistry      Component Value Date/Time   NA 140 03/11/2023 1156   K 3.4 (L) 03/11/2023 1156   CL 104 03/11/2023 1156   CO2 28 03/11/2023 1156   BUN 20 03/11/2023 1156   CREATININE 0.86 03/11/2023 1156      Component Value Date/Time   CALCIUM 8.6 (L) 03/11/2023 1156   ALKPHOS 73 03/11/2023 1156   AST 15 03/11/2023 1156   ALT 10 03/11/2023 1156   BILITOT 0.1 (L) 03/11/2023 1156

## 2023-03-11 NOTE — Assessment & Plan Note (Signed)
Discussed importance of nicotine cessation and she is willing to quit on her own

## 2023-03-12 LAB — T4: T4, Total: 7.7 ug/dL (ref 4.5–12.0)

## 2023-03-14 NOTE — Progress Notes (Incomplete)
  Radiation Oncology         [phone] ________________________________  Name: Lorraine Yates MRN: [id number]  Date: 03/15/2023  DOB: [birth year]  CC: Patient, No Pcp Per  Carver Fila, MD  HDR BRACHYTHERAPY NOTE  DIAGNOSIS: Stage IIIc, squamous cell carcinoma of the cervix.    Cancer Staging  Cervical cancer [facility]) Staging form: Cervix Uteri, AJCC Version 9 - Clinical stage from 12/02/2022: Stage IIIC1 (cT1b3, cN1, cM0) - Signed by Artis Delay, MD on 12/15/2022  NARRATIVE: The patient was brought to the HDR suite. Identity was confirmed. All relevant records and images related to the planned course of therapy were reviewed. The patient freely provided informed written consent to proceed with treatment after reviewing the details related to the planned course of therapy. The consent form was witnessed and verified by the simulation staff. Then, the patient was set-up in a stable reproducible supine position for radiation therapy. The tandem ring system was accessed and fiducial markers were placed within the tandem and ring.   Simple treatment device note: On the operating room the patient had construction of her custom tandem ring system. She will be treated with a 45 tandem/ring system. The patient had placement of a 60 mm tandem. A cervical ring with a large shielding was used for her treatment. A rectal paddle was also part of her custom set up device.  Verification simulation note: Imaging equipment in the high-dose-rate suite was not operational today.  Therefore the patient was taken back to the CT scanner suite immediately prior to her high-dose-rate treatment to confirm accurate position of the tandem ring.  Based on CT imaging the tandem ring system remained in good position.  High-dose-rate brachytherapy treatment note:   The remote afterloading device was accessed through catheter system and attached to the tandem ring system. Patient then proceeded to undergo her second  high-dose-rate treatment directed at the cervix. The patient was prescribed a dose of 5.5 gray to be delivered to the high risk clinical target volume.. Patient was treated with 2 channels using 27 dwell positions. Treatment time was 597.6 seconds. The patient tolerated the procedure well. After completion of her therapy, a radiation survey was performed documenting return of the iridium source into the GammaMed safe. The patient was then transferred to the nursing suite.  She then had removal of the rectal paddle followed by the tandem and ring system. The patient tolerated the removal well.  PLAN: The patient will return next week for her third high-dose-rate treatment.  ________________________________   -----------------------------------  Billie Lade, PhD, MD  This document serves as a record of services personally performed by Antony Blackbird, MD. It was created on his behalf by Neena Rhymes, a trained medical scribe. The creation of this record is based on the scribe's personal observations and the provider's statements to them. This document has been checked and approved by the attending provider.

## 2023-03-15 ENCOUNTER — Encounter (HOSPITAL_BASED_OUTPATIENT_CLINIC_OR_DEPARTMENT_OTHER): Payer: Self-pay | Admitting: Radiation Oncology

## 2023-03-15 ENCOUNTER — Encounter (HOSPITAL_BASED_OUTPATIENT_CLINIC_OR_DEPARTMENT_OTHER)
Admission: RE | Disposition: A | Discharge status: Home / Self Care | Payer: Self-pay | Source: Home / Self Care | Attending: Radiation Oncology

## 2023-03-15 ENCOUNTER — Ambulatory Visit
Admission: RE | Admit: 2023-03-15 | Discharge: 2023-03-15 | Disposition: A | Discharge status: Home / Self Care | Payer: BC Managed Care – PPO | Source: Ambulatory Visit | Attending: Radiation Oncology | Admitting: Radiation Oncology

## 2023-03-15 ENCOUNTER — Ambulatory Visit (HOSPITAL_BASED_OUTPATIENT_CLINIC_OR_DEPARTMENT_OTHER)
Admission: RE | Admit: 2023-03-15 | Discharge: 2023-03-15 | Disposition: A | Discharge status: Home / Self Care | Payer: BC Managed Care – PPO | Attending: Radiation Oncology | Admitting: Radiation Oncology

## 2023-03-15 ENCOUNTER — Ambulatory Visit (HOSPITAL_COMMUNITY)
Admission: RE | Admit: 2023-03-15 | Discharge: 2023-03-15 | Disposition: A | Discharge status: Home / Self Care | Payer: BC Managed Care – PPO | Source: Ambulatory Visit | Attending: Radiation Oncology | Admitting: Radiation Oncology

## 2023-03-15 ENCOUNTER — Ambulatory Visit (HOSPITAL_BASED_OUTPATIENT_CLINIC_OR_DEPARTMENT_OTHER): Payer: BC Managed Care – PPO | Admitting: Anesthesiology

## 2023-03-15 ENCOUNTER — Other Ambulatory Visit: Payer: Self-pay

## 2023-03-15 VITALS — BP 175/93 | HR 60 | Resp 18

## 2023-03-15 DIAGNOSIS — Z01818 Encounter for other preprocedural examination: Secondary | ICD-10-CM

## 2023-03-15 DIAGNOSIS — Z95828 Presence of other vascular implants and grafts: Secondary | ICD-10-CM | POA: Diagnosis present

## 2023-03-15 DIAGNOSIS — Z923 Personal history of irradiation: Secondary | ICD-10-CM | POA: Insufficient documentation

## 2023-03-15 DIAGNOSIS — C538 Malignant neoplasm of overlapping sites of cervix uteri: Secondary | ICD-10-CM

## 2023-03-15 DIAGNOSIS — Z8541 Personal history of malignant neoplasm of cervix uteri: Secondary | ICD-10-CM

## 2023-03-15 DIAGNOSIS — Z9221 Personal history of antineoplastic chemotherapy: Secondary | ICD-10-CM | POA: Insufficient documentation

## 2023-03-15 DIAGNOSIS — F17210 Nicotine dependence, cigarettes, uncomplicated: Secondary | ICD-10-CM | POA: Insufficient documentation

## 2023-03-15 DIAGNOSIS — N888 Other specified noninflammatory disorders of cervix uteri: Secondary | ICD-10-CM | POA: Insufficient documentation

## 2023-03-15 DIAGNOSIS — C539 Malignant neoplasm of cervix uteri, unspecified: Secondary | ICD-10-CM | POA: Diagnosis present

## 2023-03-15 HISTORY — PX: TANDEM RING INSERTION: SHX6199

## 2023-03-15 HISTORY — PX: OPERATIVE ULTRASOUND: SHX5996

## 2023-03-15 LAB — RAD ONC ARIA SESSION SUMMARY
Course Elapsed Days: 54
Plan Fractions Treated to Date: 1
Plan Prescribed Dose Per Fraction: 5.5 Gy
Plan Total Fractions Prescribed: 1
Plan Total Prescribed Dose: 5.5 Gy
Reference Point Dosage Given to Date: 10.1884 Gy
Reference Point Dosage Given to Date: 10.587 Gy
Reference Point Dosage Given to Date: 11 Gy
Reference Point Dosage Given to Date: 20.7048 Gy
Reference Point Dosage Given to Date: 22.1131 Gy
Reference Point Session Dosage Given: 4.7667 Gy
Reference Point Session Dosage Given: 4.9358 Gy
Reference Point Session Dosage Given: 5.5 Gy
Reference Point Session Dosage Given: 9.4475 Gy
Reference Point Session Dosage Given: 9.7732 Gy
Session Number: 32

## 2023-03-15 SURGERY — INSERTION, UTERINE TANDEM AND RING OR CYLINDER, FOR BRACHYTHERAPY
Anesthesia: General

## 2023-03-15 MED ORDER — DEXAMETHASONE SODIUM PHOSPHATE 10 MG/ML IJ SOLN
INTRAMUSCULAR | Status: DC | PRN
  Administered 2023-03-15 (×2): 5 mg via INTRAVENOUS

## 2023-03-15 MED ORDER — ONDANSETRON HCL 4 MG/2ML IJ SOLN
INTRAMUSCULAR | Status: DC | PRN
  Administered 2023-03-15: 4 mg via INTRAVENOUS

## 2023-03-15 MED ORDER — OXYCODONE HCL 5 MG PO TABS: 5.0000 mg | ORAL_TABLET | Freq: Once | ORAL | Status: DC | PRN

## 2023-03-15 MED ORDER — OXYCODONE HCL 5 MG/5ML PO SOLN: 5.0000 mg | Freq: Once | ORAL | Status: DC | PRN

## 2023-03-15 MED ORDER — FENTANYL CITRATE (PF) 100 MCG/2ML IJ SOLN
INTRAMUSCULAR | Status: AC
  Filled 2023-03-15: qty 2

## 2023-03-15 MED ORDER — OXYCODONE HCL 5 MG PO TABS
5.0000 mg | ORAL_TABLET | Freq: Once | ORAL | Status: AC
  Administered 2023-03-15: 5 mg via ORAL

## 2023-03-15 MED ORDER — LIDOCAINE 2% (20 MG/ML) 5 ML SYRINGE
INTRAMUSCULAR | Status: DC | PRN
  Administered 2023-03-15: 60 mg via INTRAVENOUS

## 2023-03-15 MED ORDER — ONDANSETRON HCL 4 MG/2ML IJ SOLN
INTRAMUSCULAR | Status: AC
  Filled 2023-03-15: qty 2

## 2023-03-15 MED ORDER — LACTATED RINGERS IV SOLN: INTRAVENOUS | Status: DC

## 2023-03-15 MED ORDER — LACTATED RINGERS IV SOLN
INTRAVENOUS | Status: DC
  Filled 2023-03-15 (×2): qty 250

## 2023-03-15 MED ORDER — MIDAZOLAM HCL 2 MG/2ML IJ SOLN
INTRAMUSCULAR | Status: AC
  Filled 2023-03-15: qty 2

## 2023-03-15 MED ORDER — PROPOFOL 10 MG/ML IV BOLUS
INTRAVENOUS | Status: AC
  Filled 2023-03-15: qty 20

## 2023-03-15 MED ORDER — FENTANYL CITRATE (PF) 100 MCG/2ML IJ SOLN
INTRAMUSCULAR | Status: DC | PRN
  Administered 2023-03-15: 25 ug via INTRAVENOUS
  Administered 2023-03-15: 50 ug via INTRAVENOUS
  Administered 2023-03-15: 25 ug via INTRAVENOUS

## 2023-03-15 MED ORDER — DEXAMETHASONE SODIUM PHOSPHATE 10 MG/ML IJ SOLN
INTRAMUSCULAR | Status: AC
  Filled 2023-03-15: qty 1

## 2023-03-15 MED ORDER — 0.9 % SODIUM CHLORIDE (POUR BTL) OPTIME
TOPICAL | Status: DC | PRN
Start: 2023-03-15 — End: 2023-03-15
  Administered 2023-03-15: 500 mL

## 2023-03-15 MED ORDER — LIDOCAINE HCL (PF) 2 % IJ SOLN
INTRAMUSCULAR | Status: AC
  Filled 2023-03-15: qty 5

## 2023-03-15 MED ORDER — FENTANYL CITRATE (PF) 100 MCG/2ML IJ SOLN
25.0000 ug | INTRAMUSCULAR | Status: DC | PRN
  Administered 2023-03-15 (×2): 50 ug via INTRAVENOUS

## 2023-03-15 MED ORDER — OXYCODONE HCL 5 MG PO TABS
ORAL_TABLET | ORAL | Status: AC
  Filled 2023-03-15: qty 1

## 2023-03-15 MED ORDER — ONDANSETRON HCL 4 MG/2ML IJ SOLN: 4.0000 mg | Freq: Once | INTRAMUSCULAR | Status: DC | PRN

## 2023-03-15 MED ORDER — ACETAMINOPHEN 10 MG/ML IV SOLN: 1000.0000 mg | Freq: Once | INTRAVENOUS | Status: DC | PRN

## 2023-03-15 MED ORDER — SODIUM CHLORIDE 0.9 % IR SOLN
Status: DC | PRN
  Administered 2023-03-15: 200 mL via INTRAVESICAL

## 2023-03-15 MED ORDER — WHITE PETROLATUM EX OINT
TOPICAL_OINTMENT | CUTANEOUS | Status: AC
  Filled 2023-03-15: qty 5

## 2023-03-15 MED ORDER — PROPOFOL 10 MG/ML IV BOLUS
INTRAVENOUS | Status: DC | PRN
  Administered 2023-03-15: 200 mg via INTRAVENOUS

## 2023-03-15 MED ORDER — MIDAZOLAM HCL 2 MG/2ML IJ SOLN
INTRAMUSCULAR | Status: DC | PRN
  Administered 2023-03-15: 2 mg via INTRAVENOUS

## 2023-03-15 SURGICAL SUPPLY — 20 items
BNDG CMPR 75X21 PLY HI ABS (MISCELLANEOUS)
DILATOR CANAL MILEX (MISCELLANEOUS) IMPLANT
DRSG TELFA 3X8 NADH STRL (GAUZE/BANDAGES/DRESSINGS) ×1 IMPLANT
GAUZE 4X4 16PLY ~~LOC~~+RFID DBL (SPONGE) ×2 IMPLANT
GAUZE PAD ABD 8X10 STRL (GAUZE/BANDAGES/DRESSINGS) ×1 IMPLANT
GAUZE STRETCH 2X75IN STRL (MISCELLANEOUS) IMPLANT
GLOVE BIO SURGEON STRL SZ7.5 (GLOVE) ×2 IMPLANT
GOWN STRL REUS W/TWL LRG LVL3 (GOWN DISPOSABLE) ×1 IMPLANT
HOLDER FOLEY CATH W/STRAP (MISCELLANEOUS) ×1 IMPLANT
IV NS 1000ML (IV SOLUTION) ×1
IV NS 1000ML BAXH (IV SOLUTION) ×1 IMPLANT
IV SET ADMIN PUMP GEMINI W/NLD (IV SETS) ×1 IMPLANT
KIT TURNOVER CYSTO (KITS) ×1 IMPLANT
MAT PREVALON FULL STRYKER (MISCELLANEOUS) ×1 IMPLANT
PACK VAGINAL MINOR WOMEN LF (CUSTOM PROCEDURE TRAY) ×1 IMPLANT
PACKING VAGINAL (PACKING) IMPLANT
SLEEVE SCD COMPRESS KNEE MED (STOCKING) ×1 IMPLANT
TOWEL OR 17X24 6PK STRL BLUE (TOWEL DISPOSABLE) ×1 IMPLANT
TRAY FOLEY W/BAG SLVR 14FR LF (SET/KITS/TRAYS/PACK) ×1 IMPLANT
WATER STERILE IRR 500ML POUR (IV SOLUTION) ×1 IMPLANT

## 2023-03-15 NOTE — Op Note (Signed)
03/15/2023  8:31 AM  PATIENT:  Lorraine Yates  52 y.o. female  PRE-OPERATIVE DIAGNOSIS:  exocervix  POST-OPERATIVE DIAGNOSIS:  exocervix  PROCEDURE:  Procedure(s): TANDEM RING INSERTION (N/A) OPERATIVE ULTRASOUND (N/A)  SURGEON:  Surgeons and Role:    * Antony Blackbird, MD - Primary  PHYSICIAN ASSISTANT:   ASSISTANTS: none   ANESTHESIA:   general  EBL:  0 mL   BLOOD ADMINISTERED:none  DRAINS: Urinary Catheter (Foley)   LOCAL MEDICATIONS USED:  NONE  SPECIMEN:  No Specimen  DISPOSITION OF SPECIMEN:  N/A  COUNTS:  YES  TOURNIQUET:  * No tourniquets in log *  DICTATION: She was taken to the [facility] surgical center room #1.  The patient was prepped and draped in the usual sterile fashion and placed in the dorsolithotomy position.  A Foley catheter was placed and backfilled with approximately 200 cc of sterile water for ultrasound imaging.  Timeout through the procedure, preoperative medications and allergies was performed.   The patient then proceeded to undergo exam under anesthesia.  Significant reduction of the cervical mass was noted.  The residual tumor was flush with the right vaginal fornix.  There was some thickening noted in the right parametrial area but no obvious parametrial involvement.  Erythema was noted to the anterior lip of the cervix.  Cervical mass on exam was approximately 3-1/2 x 4 cm.  The uterus was anteverted on trans abdominal ultrasound.  The uterus length was approximately 8 cm.  The cervical sleeve placed last week remained in good position so dilation of the cervical os was not necessary.She then had a 60 mm 45 degree tandem  placed within the endometrial cavity and endocervical canal.  Trans abdominal ultrasound documented good placement of the treatment equipment.  A 45 degree ring with large shielding cap was placed and attached to the tandem.  The patient then had placement of a rectal paddle posteriorly to limit high-dose radiation therapy to the  rectal mucosa.  The patient tolerated the procedure well.  She was subsequently transported to recovery room in stable condition.  Later in the day the patient will be brought down to radiation oncology for planning and treatment for her second high-dose-rate treatment.  She is to receive high-dose-rate brachytherapy treatment using iridium 192.  Prescription will be 5.5 Gy to the high risk clinical target volume.  She is to receive 3 subsequent high-dose-rate treatments to complete her definitive course of radiation therapy.   PLAN OF CARE:  Transfer to radiation oncology for planning and treatment  PATIENT DISPOSITION:  PACU - hemodynamically stable.   Delay start of Pharmacological VTE agent (>24hrs) due to surgical blood loss or risk of bleeding: not applicable

## 2023-03-15 NOTE — Anesthesia Postprocedure Evaluation (Signed)
Anesthesia Post Note  Patient: Lorraine Yates  Procedure(s) Performed: TANDEM RING INSERTION OPERATIVE ULTRASOUND     Patient location during evaluation: PACU Anesthesia Type: General Level of consciousness: awake and alert Pain management: pain level controlled Vital Signs Assessment: post-procedure vital signs reviewed and stable Respiratory status: spontaneous breathing, nonlabored ventilation, respiratory function stable and patient connected to nasal cannula oxygen Cardiovascular status: blood pressure returned to baseline and stable Postop Assessment: no apparent nausea or vomiting Anesthetic complications: no   No notable events documented.  Last Vitals:  Vitals:   03/15/23 0851 03/15/23 0854  BP:    Pulse: (!) 59 61  Resp: 16 (!) 7  Temp:  36.6 C  SpO2: 100% 100%    Last Pain:  Vitals:   03/15/23 0845  TempSrc:   PainSc: 0-No pain                 Mariann Barter

## 2023-03-15 NOTE — Transfer of Care (Signed)
Immediate Anesthesia Transfer of Care Note  Patient: Lorraine Yates  Procedure(s) Performed: Procedure(s) (LRB): TANDEM RING INSERTION (N/A) OPERATIVE ULTRASOUND (N/A)  Patient Location: PACU  Anesthesia Type: General  Level of Consciousness: awake, oriented, sedated and patient cooperative  Airway & Oxygen Therapy: Patient Spontanous Breathing and Patient connected to face mask oxygen  Post-op Assessment: Report given to PACU RN and Post -op Vital signs reviewed and stable  Post vital signs: Reviewed and stable  Complications: No apparent anesthesia complications  Last Vitals:  Vitals Value Taken Time  BP 170/94 03/15/23 0810  Temp 36.5 C 03/15/23 0811  Pulse 76 03/15/23 0811  Resp 11 03/15/23 0811  SpO2 100 % 03/15/23 0811  Vitals shown include unfiled device data.  Last Pain:  Vitals:   03/15/23 0624  TempSrc: Oral         Complications: No notable events documented.

## 2023-03-15 NOTE — Interval H&P Note (Signed)
History and Physical Interval Note:  03/15/2023 7:27 AM  Lorraine Yates  has presented today for surgery, with the diagnosis of exocervix.  The various methods of treatment have been discussed with the patient and family. After consideration of risks, benefits and other options for treatment, the patient has consented to  Procedure(s): TANDEM RING INSERTION (N/A) OPERATIVE ULTRASOUND (N/A) as a surgical intervention.  The patient's history has been reviewed, patient examined, no change in status, stable for surgery.  I have reviewed the patient's chart and labs.  Questions were answered to the patient's satisfaction.     Antony Blackbird

## 2023-03-15 NOTE — Anesthesia Preprocedure Evaluation (Addendum)
Anesthesia Evaluation  Patient identified by MRN, date of birth, ID band Patient awake    Reviewed: Allergy & Precautions, NPO status , Patient's Chart, lab work & pertinent test results, reviewed documented beta blocker date and time   History of Anesthesia Complications Negative for: history of anesthetic complications  Airway Mallampati: III  TM Distance: >3 FB     Dental no notable dental hx.    Pulmonary Current Smoker and Patient abstained from smoking.   breath sounds clear to auscultation       Cardiovascular (-) CAD, (-) Past MI, (-) Cardiac Stents, (-) CABG and (-) CHF  Rhythm:Regular Rate:Normal     Neuro/Psych neg Seizures    GI/Hepatic ,neg GERD  ,,(+) neg Cirrhosis        Endo/Other    Renal/GU Renal disease     Musculoskeletal   Abdominal   Peds  Hematology  (+) Blood dyscrasia (hgb 9.8), anemia   Anesthesia Other Findings Cervical cancer  Reproductive/Obstetrics                              Anesthesia Physical Anesthesia Plan  ASA: 3  Anesthesia Plan: General   Post-op Pain Management:    Induction: Intravenous  PONV Risk Score and Plan: 1 and Ondansetron  Airway Management Planned: LMA  Additional Equipment:   Intra-op Plan:   Post-operative Plan: Extubation in OR  Informed Consent: I have reviewed the patients History and Physical, chart, labs and discussed the procedure including the risks, benefits and alternatives for the proposed anesthesia with the patient or authorized representative who has indicated his/her understanding and acceptance.     Dental advisory given  Plan Discussed with: CRNA  Anesthesia Plan Comments:          Anesthesia Quick Evaluation

## 2023-03-15 NOTE — Patient Instructions (Signed)
IMMEDIATELY FOLLOWING SURGERY: Do not drive or operate machinery for the first twenty four hours after surgery. Do not make any important decisions for twenty four hours after surgery or while taking narcotic pain medications or sedatives. If you develop intractable nausea and vomiting or a severe headache please notify your doctor immediately.   FOLLOW-UP: You do not need to follow up with anesthesia unless specifically instructed to do so.   WOUND CARE INSTRUCTIONS (if applicable): Expect some mild vaginal bleeding, but if large amount of bleeding occurs please contact Dr. Roselind Messier at [phone] or the Radiation On-Call physician. Call for any fever greater than 101.0 degrees or increasing vaginal//abdominal pain or trouble urinating.   QUESTIONS?: Please feel free to call your physician or the hospital operator if you have any questions, and they will be happy to assist you. Resume all medications: as listed on your after visit summary. Your next appointment is:  Future Appointments  Date Time Provider Department Center  03/21/2023  7:00 AM [facility] 1 [facility] [place]  03/21/2023 10:00 AM Antony Blackbird, MD [facility] None  03/21/2023  2:00 PM Antony Blackbird, MD [facility] None  03/31/2023  9:15 AM [facility] FLUSH [facility] None  03/31/2023  9:40 AM Artis Delay, MD [facility] None  03/31/2023 10:30 AM [facility] INFUSION [facility] None  04/01/2023  7:00 AM [facility] 1 [facility]US [place]  04/01/2023 10:00 AM Antony Blackbird, MD [facility] None  04/01/2023  2:00 PM Antony Blackbird, MD [facility] None  04/07/2023  7:00 AM [facility] 1 [facility]US [place]  04/07/2023 10:00 AM Antony Blackbird, MD [facility] None  04/07/2023  2:00 PM Antony Blackbird, MD [facility] None  04/22/2023 10:30 AM [facility] FLUSH [facility] None  04/22/2023 11:20 AM Artis Delay, MD [facility] None  04/22/2023 12:30 PM [facility] INFUSION [facility] None

## 2023-03-15 NOTE — Anesthesia Procedure Notes (Signed)
Procedure Name: LMA Insertion Date/Time: 03/15/2023 7:39 AM  Performed by: Francie Massing, CRNAPre-anesthesia Checklist: Patient identified, Emergency Drugs available, Suction available and Patient being monitored Patient Re-evaluated:Patient Re-evaluated prior to induction Oxygen Delivery Method: Circle system utilized Preoxygenation: Pre-oxygenation with 100% oxygen Induction Type: IV induction Ventilation: Mask ventilation without difficulty LMA: LMA inserted LMA Size: 4.0 Number of attempts: 1 Airway Equipment and Method: Bite block Placement Confirmation: positive ETCO2 Tube secured with: Tape Dental Injury: Teeth and Oropharynx as per pre-operative assessment

## 2023-03-16 ENCOUNTER — Encounter (HOSPITAL_BASED_OUTPATIENT_CLINIC_OR_DEPARTMENT_OTHER): Payer: Self-pay | Admitting: Radiation Oncology

## 2023-03-17 ENCOUNTER — Other Ambulatory Visit: Payer: Self-pay

## 2023-03-17 ENCOUNTER — Encounter (HOSPITAL_BASED_OUTPATIENT_CLINIC_OR_DEPARTMENT_OTHER): Payer: Self-pay | Admitting: Radiation Oncology

## 2023-03-17 NOTE — Progress Notes (Signed)
Spoke w/ via phone for pre-op interview--- pt Lab needs dos----   no            Lab results------pt had labs done 03-11-2023 in epic CBC/ CMP and negative UPT on 08/ 29 COVID test -----patient states asymptomatic no test needed Arrive at ------- 0530 on 03-21-2023 NPO after MN NO Solid Food.  Clear liquids from MN until--- 0430 Med rec completed Medications to take morning of surgery ----- none Diabetic medication ----- n/a Patient instructed no nail polish to be worn day of surgery Patient instructed to bring photo id and insurance card day of surgery Patient aware to have Driver (ride ) / caregiver    for 24 hours after surgery --- husband, robert Patient Special Instructions ----- n/a Pre-Op special Instructions ----- n/a Patient verbalized understanding of instructions that were given at this phone interview. Patient denies shortness of breath, chest pain, fever, cough at this phone interview.

## 2023-03-18 ENCOUNTER — Other Ambulatory Visit (HOSPITAL_COMMUNITY): Payer: Self-pay

## 2023-03-18 ENCOUNTER — Encounter: Payer: Self-pay | Admitting: Hematology and Oncology

## 2023-03-18 ENCOUNTER — Encounter: Payer: Self-pay | Admitting: Radiation Oncology

## 2023-03-18 ENCOUNTER — Other Ambulatory Visit: Payer: Self-pay | Admitting: Hematology and Oncology

## 2023-03-18 ENCOUNTER — Telehealth: Payer: Self-pay

## 2023-03-18 ENCOUNTER — Telehealth: Payer: Self-pay | Admitting: Oncology

## 2023-03-18 ENCOUNTER — Other Ambulatory Visit: Payer: Self-pay

## 2023-03-18 MED ORDER — OXYCODONE HCL 5 MG PO TABS
5.0000 mg | ORAL_TABLET | ORAL | 0 refills | Status: DC | PRN
Start: 2023-03-18 — End: 2023-04-20
  Filled 2023-03-18: qty 60, 10d supply, fill #0

## 2023-03-18 NOTE — Telephone Encounter (Signed)
Called her regarding mychart message to Dr. Roselind Messier for Oxycodone refill. Ask her to call the office next time for refill. She verbalized understanding. She ask that Dr. Bertis Ruddy send to [facility] pharmacy.  She had a episode of vomiting last night, denies nausea/ vomiting since last night. She will call the office back for questions/ concerns.

## 2023-03-18 NOTE — Telephone Encounter (Signed)
Called Lorraine Yates and she is ok with scheduling her infusion appointment on 05/12/23.

## 2023-03-19 NOTE — Anesthesia Preprocedure Evaluation (Signed)
Anesthesia Evaluation  Patient identified by MRN, date of birth, ID band Patient awake    Reviewed: Allergy & Precautions, NPO status , Patient's Chart, lab work & pertinent test results  Airway Mallampati: III  TM Distance: >3 FB Neck ROM: Full    Dental no notable dental hx. (+) Dental Advisory Given, Teeth Intact   Pulmonary Current Smoker and Patient abstained from smoking.   Pulmonary exam normal breath sounds clear to auscultation       Cardiovascular Normal cardiovascular exam Rhythm:Regular Rate:Normal     Neuro/Psych    GI/Hepatic   Endo/Other    Renal/GU      Musculoskeletal   Abdominal   Peds  Hematology  (+) Blood dyscrasia (hgb 9.8), anemia   Anesthesia Other Findings Cervical cancer  Reproductive/Obstetrics                              Anesthesia Physical Anesthesia Plan  ASA: 3  Anesthesia Plan: General   Post-op Pain Management: Tylenol PO (pre-op)*   Induction: Intravenous  PONV Risk Score and Plan: 1 and Ondansetron, Treatment may vary due to age or medical condition, Midazolam and Dexamethasone  Airway Management Planned: LMA  Additional Equipment: None  Intra-op Plan:   Post-operative Plan: Extubation in OR  Informed Consent: I have reviewed the patients History and Physical, chart, labs and discussed the procedure including the risks, benefits and alternatives for the proposed anesthesia with the patient or authorized representative who has indicated his/her understanding and acceptance.     Dental advisory given  Plan Discussed with: CRNA  Anesthesia Plan Comments:          Anesthesia Quick Evaluation

## 2023-03-20 NOTE — Progress Notes (Signed)
  Radiation Oncology         [phone] ________________________________  Name: Lorraine Yates MRN: [id number]  Date: 03/21/2023  DOB: Jun 29, 1971  CC: Patient, No Pcp Per  Carver Fila, MD  HDR BRACHYTHERAPY NOTE  DIAGNOSIS: Stage IIIc, squamous cell carcinoma of the cervix.    Cancer Staging  Cervical cancer [facility]) Staging form: Cervix Uteri, AJCC Version 9 - Clinical stage from 12/02/2022: Stage IIIC1 (cT1b3, cN1, cM0) - Signed by Artis Delay, MD on 12/15/2022  NARRATIVE: The patient was brought to the HDR suite. Identity was confirmed. All relevant records and images related to the planned course of therapy were reviewed. The patient freely provided informed written consent to proceed with treatment after reviewing the details related to the planned course of therapy. The consent form was witnessed and verified by the simulation staff. Then, the patient was set-up in a stable reproducible supine position for radiation therapy. The tandem ring system was accessed and fiducial markers were placed within the tandem and ring.   Simple treatment device note: On the operating room the patient had construction of her custom tandem ring system. She will be treated with a 45 tandem/ring system. The patient had placement of a 60 mm tandem. A cervical ring with a large shielding was used for her treatment. A rectal paddle was also part of her custom set up device.  Verification simulation note: Imaging equipment in the high-dose-rate suite was not operational today.  Therefore the patient was taken back to the CT scanner suite immediately prior to her high-dose-rate treatment to confirm accurate position of the tandem ring.  Based on CT imaging the tandem ring system remained in good position.  High-dose-rate brachytherapy treatment note:   The remote afterloading device was accessed through catheter system and attached to the tandem ring system. Patient then proceeded to undergo her third  high-dose-rate treatment directed at the cervix. The patient was prescribed a dose of 5.5 gray to be delivered to the high risk clinical target volume.. Patient was treated with 2 channels using 27 dwell positions. Treatment time was *** seconds. The patient tolerated the procedure well. After completion of her therapy, a radiation survey was performed documenting return of the iridium source into the GammaMed safe. The patient was then transferred to the nursing suite.  She then had removal of the rectal paddle followed by the tandem and ring system. The patient tolerated the removal well.  PLAN: The patient will return on 09/20 for her fourth high-dose-rate treatment.  ________________________________   -----------------------------------  Billie Lade, PhD, MD  This document serves as a record of services personally performed by Antony Blackbird, MD. It was created on his behalf by Neena Rhymes, a trained medical scribe. The creation of this record is based on the scribe's personal observations and the provider's statements to them. This document has been checked and approved by the attending provider.

## 2023-03-21 ENCOUNTER — Other Ambulatory Visit: Payer: Self-pay

## 2023-03-21 ENCOUNTER — Other Ambulatory Visit (HOSPITAL_COMMUNITY): Payer: Self-pay | Admitting: Radiation Oncology

## 2023-03-21 ENCOUNTER — Ambulatory Visit (HOSPITAL_BASED_OUTPATIENT_CLINIC_OR_DEPARTMENT_OTHER): Payer: Self-pay | Admitting: Anesthesiology

## 2023-03-21 ENCOUNTER — Ambulatory Visit
Admission: RE | Admit: 2023-03-21 | Discharge: 2023-03-21 | Disposition: A | Discharge status: Home / Self Care | Payer: BC Managed Care – PPO | Source: Ambulatory Visit | Attending: Radiation Oncology | Admitting: Radiation Oncology

## 2023-03-21 ENCOUNTER — Encounter (HOSPITAL_BASED_OUTPATIENT_CLINIC_OR_DEPARTMENT_OTHER)
Admission: RE | Disposition: A | Discharge status: Other Acute Inpatient Hospital | Payer: Self-pay | Source: Home / Self Care | Attending: Radiation Oncology

## 2023-03-21 ENCOUNTER — Ambulatory Visit (HOSPITAL_COMMUNITY)
Admission: RE | Admit: 2023-03-21 | Discharge: 2023-03-21 | Disposition: A | Discharge status: Home / Self Care | Payer: BC Managed Care – PPO | Source: Ambulatory Visit | Attending: Radiation Oncology | Admitting: Radiation Oncology

## 2023-03-21 ENCOUNTER — Encounter (HOSPITAL_BASED_OUTPATIENT_CLINIC_OR_DEPARTMENT_OTHER): Payer: Self-pay | Admitting: Radiation Oncology

## 2023-03-21 ENCOUNTER — Ambulatory Visit (HOSPITAL_BASED_OUTPATIENT_CLINIC_OR_DEPARTMENT_OTHER)
Admission: RE | Admit: 2023-03-21 | Discharge: 2023-03-21 | Disposition: A | Discharge status: Other Acute Inpatient Hospital | Payer: BC Managed Care – PPO | Attending: Radiation Oncology | Admitting: Radiation Oncology

## 2023-03-21 ENCOUNTER — Ambulatory Visit (HOSPITAL_BASED_OUTPATIENT_CLINIC_OR_DEPARTMENT_OTHER): Payer: BC Managed Care – PPO | Admitting: Anesthesiology

## 2023-03-21 VITALS — BP 171/108 | HR 85 | Resp 18

## 2023-03-21 VITALS — BP 149/100 | HR 76 | Resp 18

## 2023-03-21 DIAGNOSIS — C539 Malignant neoplasm of cervix uteri, unspecified: Secondary | ICD-10-CM | POA: Insufficient documentation

## 2023-03-21 DIAGNOSIS — Z01818 Encounter for other preprocedural examination: Secondary | ICD-10-CM

## 2023-03-21 DIAGNOSIS — Z8541 Personal history of malignant neoplasm of cervix uteri: Secondary | ICD-10-CM | POA: Insufficient documentation

## 2023-03-21 DIAGNOSIS — F17210 Nicotine dependence, cigarettes, uncomplicated: Secondary | ICD-10-CM | POA: Insufficient documentation

## 2023-03-21 DIAGNOSIS — C538 Malignant neoplasm of overlapping sites of cervix uteri: Secondary | ICD-10-CM

## 2023-03-21 DIAGNOSIS — C531 Malignant neoplasm of exocervix: Secondary | ICD-10-CM | POA: Insufficient documentation

## 2023-03-21 DIAGNOSIS — Z923 Personal history of irradiation: Secondary | ICD-10-CM | POA: Insufficient documentation

## 2023-03-21 HISTORY — PX: OPERATIVE ULTRASOUND: SHX5996

## 2023-03-21 HISTORY — PX: TANDEM RING INSERTION: SHX6199

## 2023-03-21 LAB — RAD ONC ARIA SESSION SUMMARY
Course Elapsed Days: 60
Plan Fractions Treated to Date: 1
Plan Prescribed Dose Per Fraction: 5.5 Gy
Plan Total Fractions Prescribed: 1
Plan Total Prescribed Dose: 5.5 Gy
Reference Point Dosage Given to Date: 15.4516 Gy
Reference Point Dosage Given to Date: 15.9813 Gy
Reference Point Dosage Given to Date: 16.5 Gy
Reference Point Dosage Given to Date: 31.7306 Gy
Reference Point Dosage Given to Date: 33.3197 Gy
Reference Point Session Dosage Given: 11.0257 Gy
Reference Point Session Dosage Given: 11.2066 Gy
Reference Point Session Dosage Given: 5.2633 Gy
Reference Point Session Dosage Given: 5.3943 Gy
Reference Point Session Dosage Given: 5.5 Gy
Session Number: 33

## 2023-03-21 SURGERY — INSERTION, UTERINE TANDEM AND RING OR CYLINDER, FOR BRACHYTHERAPY
Anesthesia: General

## 2023-03-21 MED ORDER — LACTATED RINGERS IV SOLN: INTRAVENOUS | Status: DC

## 2023-03-21 MED ORDER — KETOROLAC TROMETHAMINE 30 MG/ML IJ SOLN
INTRAMUSCULAR | Status: DC | PRN
  Administered 2023-03-21: 30 mg via INTRAVENOUS

## 2023-03-21 MED ORDER — 0.9 % SODIUM CHLORIDE (POUR BTL) OPTIME
TOPICAL | Status: DC | PRN
Start: 2023-03-21 — End: 2023-03-21
  Administered 2023-03-21: 500 mL

## 2023-03-21 MED ORDER — MIDAZOLAM HCL 2 MG/2ML IJ SOLN
INTRAMUSCULAR | Status: AC
  Filled 2023-03-21: qty 2

## 2023-03-21 MED ORDER — PROPOFOL 500 MG/50ML IV EMUL
INTRAVENOUS | Status: DC | PRN
Start: 2023-03-21 — End: 2023-03-21
  Administered 2023-03-21: 100 ug/kg/min via INTRAVENOUS

## 2023-03-21 MED ORDER — ACETAMINOPHEN 325 MG PO TABS
325.0000 mg | ORAL_TABLET | Freq: Once | ORAL | Status: AC
  Administered 2023-03-21: 325 mg via ORAL

## 2023-03-21 MED ORDER — DEXMEDETOMIDINE HCL IN NACL 80 MCG/20ML IV SOLN
INTRAVENOUS | Status: DC | PRN
  Administered 2023-03-21: 8 ug via INTRAVENOUS

## 2023-03-21 MED ORDER — ACETAMINOPHEN 500 MG PO TABS
ORAL_TABLET | ORAL | Status: AC
  Filled 2023-03-21: qty 2

## 2023-03-21 MED ORDER — FENTANYL CITRATE (PF) 100 MCG/2ML IJ SOLN
INTRAMUSCULAR | Status: AC
  Filled 2023-03-21: qty 2

## 2023-03-21 MED ORDER — ACETAMINOPHEN 10 MG/ML IV SOLN: 1000.0000 mg | Freq: Once | INTRAVENOUS | Status: DC | PRN

## 2023-03-21 MED ORDER — ACETAMINOPHEN 325 MG PO TABS
ORAL_TABLET | ORAL | Status: AC
  Filled 2023-03-21: qty 1

## 2023-03-21 MED ORDER — OXYCODONE HCL 5 MG PO TABS
ORAL_TABLET | ORAL | Status: AC
  Filled 2023-03-21: qty 1

## 2023-03-21 MED ORDER — LACTATED RINGERS IV SOLN
INTRAVENOUS | Status: DC
  Filled 2023-03-21 (×2): qty 250

## 2023-03-21 MED ORDER — PROPOFOL 10 MG/ML IV BOLUS
INTRAVENOUS | Status: DC | PRN
  Administered 2023-03-21: 50 mg via INTRAVENOUS
  Administered 2023-03-21: 150 mg via INTRAVENOUS

## 2023-03-21 MED ORDER — ACETAMINOPHEN 500 MG PO TABS
1000.0000 mg | ORAL_TABLET | Freq: Once | ORAL | Status: AC
  Administered 2023-03-21: 1000 mg via ORAL

## 2023-03-21 MED ORDER — LIDOCAINE HCL (CARDIAC) PF 100 MG/5ML IV SOSY
PREFILLED_SYRINGE | INTRAVENOUS | Status: DC | PRN
  Administered 2023-03-21: 50 mg via INTRAVENOUS

## 2023-03-21 MED ORDER — MEPERIDINE HCL 25 MG/ML IJ SOLN
25.0000 mg | Freq: Once | INTRAMUSCULAR | Status: AC
  Administered 2023-03-21: 25 mg via INTRAVENOUS
  Filled 2023-03-21: qty 1

## 2023-03-21 MED ORDER — ESTRADIOL 0.1 MG/GM VA CREA
TOPICAL_CREAM | VAGINAL | Status: DC | PRN
  Administered 2023-03-21: 1 via VAGINAL

## 2023-03-21 MED ORDER — MIDAZOLAM HCL 2 MG/2ML IJ SOLN
INTRAMUSCULAR | Status: DC | PRN
  Administered 2023-03-21: 2 mg via INTRAVENOUS

## 2023-03-21 MED ORDER — ONDANSETRON HCL 4 MG/2ML IJ SOLN: 4.0000 mg | Freq: Once | INTRAMUSCULAR | Status: DC | PRN

## 2023-03-21 MED ORDER — WHITE PETROLATUM EX OINT
TOPICAL_OINTMENT | CUTANEOUS | Status: AC
  Filled 2023-03-21: qty 5

## 2023-03-21 MED ORDER — DEXAMETHASONE SODIUM PHOSPHATE 4 MG/ML IJ SOLN
INTRAMUSCULAR | Status: DC | PRN
  Administered 2023-03-21: 8 mg via INTRAVENOUS

## 2023-03-21 MED ORDER — ONDANSETRON HCL 4 MG/2ML IJ SOLN
INTRAMUSCULAR | Status: DC | PRN
  Administered 2023-03-21: 4 mg via INTRAVENOUS

## 2023-03-21 MED ORDER — SODIUM CHLORIDE 0.9 % IR SOLN
Status: DC | PRN
  Administered 2023-03-21: 1000 mL via INTRAVESICAL

## 2023-03-21 MED ORDER — OXYCODONE HCL 5 MG PO TABS
5.0000 mg | ORAL_TABLET | Freq: Once | ORAL | Status: AC
  Administered 2023-03-21: 5 mg via ORAL

## 2023-03-21 MED ORDER — FENTANYL CITRATE (PF) 100 MCG/2ML IJ SOLN
25.0000 ug | INTRAMUSCULAR | Status: DC | PRN
  Administered 2023-03-21: 25 ug via INTRAVENOUS

## 2023-03-21 SURGICAL SUPPLY — 20 items
BNDG CMPR 75X21 PLY HI ABS (MISCELLANEOUS) ×1
DILATOR CANAL MILEX (MISCELLANEOUS) IMPLANT
DRSG TELFA 3X8 NADH STRL (GAUZE/BANDAGES/DRESSINGS) ×1 IMPLANT
GAUZE 4X4 16PLY ~~LOC~~+RFID DBL (SPONGE) ×2 IMPLANT
GAUZE PAD ABD 8X10 STRL (GAUZE/BANDAGES/DRESSINGS) ×1 IMPLANT
GAUZE STRETCH 2X75IN STRL (MISCELLANEOUS) IMPLANT
GLOVE BIO SURGEON STRL SZ7.5 (GLOVE) ×2 IMPLANT
GOWN STRL REUS W/TWL LRG LVL3 (GOWN DISPOSABLE) ×1 IMPLANT
HOLDER FOLEY CATH W/STRAP (MISCELLANEOUS) ×1 IMPLANT
IV NS 1000ML (IV SOLUTION) ×1
IV NS 1000ML BAXH (IV SOLUTION) ×1 IMPLANT
IV SET ADMIN PUMP GEMINI W/NLD (IV SETS) ×1 IMPLANT
KIT TURNOVER CYSTO (KITS) ×1 IMPLANT
MAT PREVALON FULL STRYKER (MISCELLANEOUS) ×1 IMPLANT
PACK VAGINAL MINOR WOMEN LF (CUSTOM PROCEDURE TRAY) ×1 IMPLANT
PACKING VAGINAL (PACKING) IMPLANT
SLEEVE SCD COMPRESS KNEE MED (STOCKING) ×1 IMPLANT
TOWEL OR 17X24 6PK STRL BLUE (TOWEL DISPOSABLE) ×1 IMPLANT
TRAY FOLEY W/BAG SLVR 14FR LF (SET/KITS/TRAYS/PACK) ×1 IMPLANT
WATER STERILE IRR 500ML POUR (IV SOLUTION) ×1 IMPLANT

## 2023-03-21 NOTE — Anesthesia Postprocedure Evaluation (Signed)
Anesthesia Post Note  Patient: DESERI SACHDEV  Procedure(s) Performed: TANDEM RING INSERTION OPERATIVE ULTRASOUND     Patient location during evaluation: PACU Anesthesia Type: General Level of consciousness: awake and alert Pain management: pain level controlled Vital Signs Assessment: post-procedure vital signs reviewed and stable Respiratory status: spontaneous breathing, nonlabored ventilation, respiratory function stable and patient connected to nasal cannula oxygen Cardiovascular status: blood pressure returned to baseline and stable Postop Assessment: no apparent nausea or vomiting Anesthetic complications: no   No notable events documented.  Last Vitals:  Vitals:   03/21/23 0830 03/21/23 0845  BP: 124/81 110/73  Pulse: 80 79  Resp: 11 (!) 8  Temp:    SpO2: 98% 96%    Last Pain:  Vitals:   03/21/23 0845  TempSrc:   PainSc: 4                  Trevor Iha

## 2023-03-21 NOTE — Interval H&P Note (Signed)
History and Physical Interval Note:  03/21/2023 6:49 AM  Lorraine Yates  has presented today for surgery, with the diagnosis of EXOCERVIX CANCER.  The various methods of treatment have been discussed with the patient and family. After consideration of risks, benefits and other options for treatment, the patient has consented to  Procedure(s): TANDEM RING INSERTION (N/A) OPERATIVE ULTRASOUND (N/A) as a surgical intervention.  The patient's history has been reviewed, patient examined, no change in status, stable for surgery.  I have reviewed the patient's chart and labs.  Questions were answered to the patient's satisfaction.     Antony Blackbird

## 2023-03-21 NOTE — Progress Notes (Signed)
Patient given 25mg  Demerol via IV  per order. Patient reports itching to left arm. Noted patient to have red raised areas to forearm. Patient denies any respiratory issues. 02 sats currently 100% on RA. Will add Demerol to patients allergy list.

## 2023-03-21 NOTE — Anesthesia Procedure Notes (Signed)
Procedure Name: LMA Insertion Date/Time: 03/21/2023 7:44 AM  Performed by: Earmon Phoenix, CRNAPre-anesthesia Checklist: Patient identified, Emergency Drugs available, Suction available, Patient being monitored and Timeout performed Patient Re-evaluated:Patient Re-evaluated prior to induction Oxygen Delivery Method: Circle system utilized Preoxygenation: Pre-oxygenation with 100% oxygen Induction Type: IV induction Ventilation: Mask ventilation without difficulty LMA: LMA inserted LMA Size: 4.0 Number of attempts: 1 Placement Confirmation: positive ETCO2 and breath sounds checked- equal and bilateral Tube secured with: Tape Dental Injury: Teeth and Oropharynx as per pre-operative assessment

## 2023-03-21 NOTE — Op Note (Signed)
03/21/2023  8:26 AM  PATIENT:  Lorraine Yates  52 y.o. female  PRE-OPERATIVE DIAGNOSIS:  EXOCERVIX CANCER  POST-OPERATIVE DIAGNOSIS:  EXOCERVIX CANCER  PROCEDURE:  Procedure(s): TANDEM RING INSERTION (N/A) OPERATIVE ULTRASOUND (N/A)  SURGEON:  Surgeons and Role:    * Antony Blackbird, MD - Primary  PHYSICIAN ASSISTANT:   ASSISTANTS: none   ANESTHESIA:   general  EBL:  0 mL   BLOOD ADMINISTERED:none  DRAINS: Urinary Catheter (Foley)   LOCAL MEDICATIONS USED:  NONE  SPECIMEN:  No Specimen  DISPOSITION OF SPECIMEN:  N/A  COUNTS:  YES  TOURNIQUET:  * No tourniquets in log *  DICTATION: . She was taken to the [facility] surgical center room #3.  The patient was prepped and draped in the usual sterile fashion and placed in the dorsolithotomy position.  A Foley catheter was placed and backfilled with approximately 200 cc of sterile water for ultrasound imaging.  Timeout through the procedure, preoperative medications and allergies was performed.   The patient then proceeded to undergo exam under anesthesia.  Significant reduction of the cervical mass was noted.  The residual tumor was flush with the right vaginal fornix.  There was some thickening noted in the right parametrial area but no obvious parametrial involvement.  Erythema was noted to the anterior lip of the cervix some thickening noted.  Cervical mass on exam was approximately 3-1/2 x 3.5 cm.  The uterus was anteverted on trans abdominal ultrasound.  The uterus length was approximately 8 cm.  The cervical sleeve placed last week remained in good position so dilation of the cervical os was not necessary. She then had a 60 mm 45 degree tandem  placed within the endometrial cavity and endocervical canal.  Trans abdominal ultrasound documented good placement of the treatment equipment.  A 45 degree ring with large shielding cap was placed and attached to the tandem.  The patient then had placement of a rectal paddle posteriorly to  limit high-dose radiation therapy to the rectal mucosa.  In addition vaginal packing was placed posterior to the rectal paddle to lower rectal point doses.  This packing was soaked in Estrace cream to aid in placement.  The patient tolerated the procedure well.  She was subsequently transported to recovery room in stable condition.  Later in the day the patient will be brought down to radiation oncology for planning and treatment for her third high-dose-rate treatment.  She is to receive high-dose-rate brachytherapy treatment using iridium 192.  Prescription will be 5.5 Gy to the high risk clinical target volume.  She is to receive 2 subsequent high-dose-rate treatments to complete her definitive course of radiation therapy.   PLAN OF CARE:  Transfer to radiation oncology for planning and treatment  PATIENT DISPOSITION:  PACU - hemodynamically stable.   Delay start of Pharmacological VTE agent (>24hrs) due to surgical blood loss or risk of bleeding: not applicable

## 2023-03-21 NOTE — Patient Instructions (Signed)
IMMEDIATELY FOLLOWING SURGERY: Do not drive or operate machinery for the first twenty four hours after surgery. Do not make any important decisions for twenty four hours after surgery or while taking narcotic pain medications or sedatives. If you develop intractable nausea and vomiting or a severe headache please notify your doctor immediately.   FOLLOW-UP: You do not need to follow up with anesthesia unless specifically instructed to do so.   WOUND CARE INSTRUCTIONS (if applicable): Expect some mild vaginal bleeding, but if large amount of bleeding occurs please contact Dr. Roselind Messier at [phone] or the Radiation On-Call physician. Call for any fever greater than 101.0 degrees or increasing vaginal//abdominal pain or trouble urinating.   QUESTIONS?: Please feel free to call your physician or the hospital operator if you have any questions, and they will be happy to assist you. Resume all medications: as listed on your after visit summary. Your next appointment is:  Future Appointments  Date Time Provider Department Center  03/31/2023  9:15 AM [facility] FLUSH [facility] None  03/31/2023  9:40 AM Artis Delay, MD [facility] None  03/31/2023 10:30 AM [facility] INFUSION [facility] None  04/01/2023  7:00 AM [facility] 1 [facility] [place]  04/01/2023 10:00 AM Antony Blackbird, MD [facility] None  04/01/2023  2:00 PM Antony Blackbird, MD [facility] None  04/07/2023  7:00 AM [facility]-US 1 [facility]US [place]  04/07/2023 10:00 AM Antony Blackbird, MD [facility] None  04/07/2023  2:00 PM Antony Blackbird, MD [facility] None  04/22/2023 10:30 AM [facility] FLUSH [facility] None  04/22/2023 11:20 AM Artis Delay, MD [facility] None  04/22/2023 12:30 PM [facility] INFUSION [facility] None

## 2023-03-21 NOTE — Transfer of Care (Signed)
Immediate Anesthesia Transfer of Care Note  Patient: Lorraine Yates  Procedure(s) Performed: TANDEM RING INSERTION OPERATIVE ULTRASOUND  Patient Location: PACU  Anesthesia Type:General  Level of Consciousness: awake, alert , oriented, and patient cooperative  Airway & Oxygen Therapy: Patient Spontanous Breathing and Patient connected to nasal cannula oxygen  Post-op Assessment: Report given to RN and Post -op Vital signs reviewed and stable  Post vital signs: Reviewed and stable  Last Vitals:  Vitals Value Taken Time  BP    Temp    Pulse 90 03/21/23 0816  Resp 12 03/21/23 0816  SpO2 100 % 03/21/23 0816  Vitals shown include unfiled device data.  Last Pain:  Vitals:   03/21/23 0557  TempSrc: Oral  PainSc: 0-No pain      Patients Stated Pain Goal: 6 (03/21/23 0557)  Complications: No notable events documented.

## 2023-03-22 ENCOUNTER — Encounter (HOSPITAL_BASED_OUTPATIENT_CLINIC_OR_DEPARTMENT_OTHER): Payer: Self-pay | Admitting: Radiation Oncology

## 2023-03-22 ENCOUNTER — Encounter: Payer: Self-pay | Admitting: Hematology and Oncology

## 2023-03-24 ENCOUNTER — Encounter (HOSPITAL_BASED_OUTPATIENT_CLINIC_OR_DEPARTMENT_OTHER): Payer: Self-pay | Admitting: Radiation Oncology

## 2023-03-24 NOTE — Progress Notes (Signed)
Spoke w/ via phone for pre-op interview--- pt Lab needs dos----   urine preg            Lab results------ lab results in epic dated 03-11-2023 CBC/ CMP COVID test -----patient states asymptomatic no test needed Arrive at ------- 0530 on 03-31-2023 NPO after MN NO Solid Food.  Clear liquids from MN until--- 0530 Med rec completed Medications to take morning of surgery ----- none Diabetic medication ----- n/a Patient instructed no nail polish to be worn day of surgery Patient instructed to bring photo id and insurance card day of surgery Patient aware to have Driver (ride ) / caregiver    for 24 hours after surgery -- husband, robert Patient Special Instructions ----- n/a Pre-Op special Instructions ----- called and spoke w/ shirley, office staff for dr Roselind Messier, requested orders and patient asked if her keytruda infusion could still be done that day as scheduled.  Talbert Forest she will let Dr Roselind Messier be aware of infusion appointment same day and have infusion nurse to call patient . Patient verbalized understanding of instructions that were given at this phone interview. Patient denies shortness of breath, chest pain, fever, cough at this phone interview.

## 2023-03-25 ENCOUNTER — Other Ambulatory Visit: Payer: Self-pay

## 2023-03-25 ENCOUNTER — Other Ambulatory Visit: Payer: Self-pay | Admitting: Hematology and Oncology

## 2023-03-25 ENCOUNTER — Encounter: Payer: Self-pay | Admitting: Hematology and Oncology

## 2023-03-25 ENCOUNTER — Telehealth: Payer: Self-pay

## 2023-03-25 MED ORDER — MAGNESIUM OXIDE -MG SUPPLEMENT 400 (240 MG) MG PO TABS: 400.0000 mg | ORAL_TABLET | Freq: Every day | ORAL | 1 refills | Status: DC

## 2023-03-25 NOTE — Telephone Encounter (Signed)
-----   Message from Artis Delay sent at 03/25/2023 10:38 AM EDT ----- Pls refill mag

## 2023-03-25 NOTE — Telephone Encounter (Signed)
Called and told her magnesium Rx sent to her pharmacy. Reviewed 9/20 appts, moved from 9/19. She verbalized understanding.

## 2023-03-30 NOTE — Progress Notes (Signed)
Radiation Oncology         [phone] ________________________________  Name: Lorraine Yates MRN: [id number]  Date: 03/31/2023  DOB: 02/02/1971  CC: Patient, No Pcp Per  Carver Fila, MD  HDR BRACHYTHERAPY NOTE  DIAGNOSIS: Stage IIIc, squamous cell carcinoma of the cervix.    Cancer Staging  Cervical cancer [facility]) Staging form: Cervix Uteri, AJCC Version 9 - Clinical stage from 12/02/2022: Stage IIIC1 (cT1b3, cN1, cM0) - Signed by Artis Delay, MD on 12/15/2022  NARRATIVE: The patient was brought to the HDR suite. Identity was confirmed. All relevant records and images related to the planned course of therapy were reviewed. The patient freely provided informed written consent to proceed with treatment after reviewing the details related to the planned course of therapy. The consent form was witnessed and verified by the simulation staff. Then, the patient was set-up in a stable reproducible supine position for radiation therapy. The tandem ring system was accessed and fiducial markers were placed within the tandem and ring.   Simple treatment device note: On the operating room the patient had construction of her custom tandem ring system. She will be treated with a 45 tandem/ring system. The patient had placement of a 60 mm tandem. A cervical ring with a large shielding was used for her treatment. A rectal paddle was also part of her custom set up device.  Vaginal packing soaked in Estrace cream was also placed posterior to the rectal paddle to lower rectal doses even further.  Verification simulation note: Imaging equipment in the high-dose-rate suite was not operational today.  Therefore the patient was taken back to the CT scanner suite immediately prior to her high-dose-rate treatment to confirm accurate position of the tandem ring.  Based on CT imaging the tandem ring system remained in good position.  High-dose-rate brachytherapy treatment note:   The remote afterloading device  was accessed through catheter system and attached to the tandem ring system. Patient then proceeded to undergo her fourth high-dose-rate treatment directed at the cervix. The patient was prescribed a dose of 5.5 gray to be delivered to the high risk clinical target volume.. Patient was treated with 2 channels using 28 dwell positions. Treatment time was *** seconds. The patient tolerated the procedure well. After completion of her therapy, a radiation survey was performed documenting return of the iridium source into the [employer] safe. The patient was then transferred to the nursing suite.  She then had removal of the rectal paddle followed by the tandem and ring system. The patient tolerated the removal well.  PLAN: The patient will return on 09/26 for her fifth high-dose-rate treatment.  ________________________________   -----------------------------------  Billie Lade, PhD, MD  This document serves as a record of services personally performed by Antony Blackbird, MD. It was created on his behalf by Neena Rhymes, a trained medical scribe. The creation of this record is based on the scribe's personal observations and the provider's statements to them. This document has been checked and approved by the attending provider.

## 2023-03-31 ENCOUNTER — Ambulatory Visit (HOSPITAL_BASED_OUTPATIENT_CLINIC_OR_DEPARTMENT_OTHER)
Admission: RE | Admit: 2023-03-31 | Discharge: 2023-03-31 | Disposition: A | Discharge status: Other Acute Inpatient Hospital | Payer: BC Managed Care – PPO | Attending: Radiation Oncology | Admitting: Radiation Oncology

## 2023-03-31 ENCOUNTER — Other Ambulatory Visit: Payer: Self-pay

## 2023-03-31 ENCOUNTER — Inpatient Hospital Stay: Payer: BC Managed Care – PPO

## 2023-03-31 ENCOUNTER — Ambulatory Visit
Admission: RE | Admit: 2023-03-31 | Discharge: 2023-03-31 | Disposition: A | Discharge status: Home / Self Care | Payer: BC Managed Care – PPO | Source: Ambulatory Visit | Attending: Radiation Oncology | Admitting: Radiation Oncology

## 2023-03-31 ENCOUNTER — Encounter (HOSPITAL_BASED_OUTPATIENT_CLINIC_OR_DEPARTMENT_OTHER): Payer: Self-pay | Admitting: Radiation Oncology

## 2023-03-31 ENCOUNTER — Inpatient Hospital Stay: Payer: BC Managed Care – PPO | Attending: Hematology and Oncology

## 2023-03-31 ENCOUNTER — Ambulatory Visit (HOSPITAL_COMMUNITY)
Admission: RE | Admit: 2023-03-31 | Discharge: 2023-03-31 | Disposition: A | Discharge status: Home / Self Care | Payer: BC Managed Care – PPO | Source: Ambulatory Visit | Attending: Radiation Oncology | Admitting: Radiation Oncology

## 2023-03-31 ENCOUNTER — Ambulatory Visit (HOSPITAL_BASED_OUTPATIENT_CLINIC_OR_DEPARTMENT_OTHER): Payer: BC Managed Care – PPO | Admitting: Anesthesiology

## 2023-03-31 ENCOUNTER — Ambulatory Visit (HOSPITAL_BASED_OUTPATIENT_CLINIC_OR_DEPARTMENT_OTHER): Payer: Self-pay | Admitting: Anesthesiology

## 2023-03-31 ENCOUNTER — Encounter (HOSPITAL_BASED_OUTPATIENT_CLINIC_OR_DEPARTMENT_OTHER)
Admission: RE | Disposition: A | Discharge status: Other Acute Inpatient Hospital | Payer: Self-pay | Source: Home / Self Care | Attending: Radiation Oncology

## 2023-03-31 ENCOUNTER — Inpatient Hospital Stay: Payer: BC Managed Care – PPO | Admitting: Hematology and Oncology

## 2023-03-31 VITALS — BP 137/87 | HR 60 | Resp 17

## 2023-03-31 DIAGNOSIS — C538 Malignant neoplasm of overlapping sites of cervix uteri: Secondary | ICD-10-CM

## 2023-03-31 DIAGNOSIS — C531 Malignant neoplasm of exocervix: Secondary | ICD-10-CM | POA: Diagnosis present

## 2023-03-31 DIAGNOSIS — Z01818 Encounter for other preprocedural examination: Secondary | ICD-10-CM

## 2023-03-31 DIAGNOSIS — C539 Malignant neoplasm of cervix uteri, unspecified: Secondary | ICD-10-CM | POA: Diagnosis present

## 2023-03-31 DIAGNOSIS — Z3202 Encounter for pregnancy test, result negative: Secondary | ICD-10-CM | POA: Insufficient documentation

## 2023-03-31 DIAGNOSIS — N939 Abnormal uterine and vaginal bleeding, unspecified: Secondary | ICD-10-CM

## 2023-03-31 DIAGNOSIS — Z95828 Presence of other vascular implants and grafts: Secondary | ICD-10-CM

## 2023-03-31 DIAGNOSIS — K5909 Other constipation: Secondary | ICD-10-CM | POA: Insufficient documentation

## 2023-03-31 DIAGNOSIS — F17210 Nicotine dependence, cigarettes, uncomplicated: Secondary | ICD-10-CM | POA: Diagnosis not present

## 2023-03-31 DIAGNOSIS — Z8541 Personal history of malignant neoplasm of cervix uteri: Secondary | ICD-10-CM | POA: Insufficient documentation

## 2023-03-31 DIAGNOSIS — Z923 Personal history of irradiation: Secondary | ICD-10-CM | POA: Insufficient documentation

## 2023-03-31 DIAGNOSIS — N888 Other specified noninflammatory disorders of cervix uteri: Secondary | ICD-10-CM

## 2023-03-31 DIAGNOSIS — Z5112 Encounter for antineoplastic immunotherapy: Secondary | ICD-10-CM | POA: Diagnosis present

## 2023-03-31 DIAGNOSIS — D61818 Other pancytopenia: Secondary | ICD-10-CM | POA: Insufficient documentation

## 2023-03-31 HISTORY — PX: OPERATIVE ULTRASOUND: SHX5996

## 2023-03-31 HISTORY — PX: TANDEM RING INSERTION: SHX6199

## 2023-03-31 LAB — CBC WITH DIFFERENTIAL (CANCER CENTER ONLY)
Abs Immature Granulocytes: 0.01 10*3/uL (ref 0.00–0.07)
Basophils Absolute: 0 10*3/uL (ref 0.0–0.1)
Basophils Relative: 0 %
Eosinophils Absolute: 0 10*3/uL (ref 0.0–0.5)
Eosinophils Relative: 0 %
HCT: 31.8 % — ABNORMAL LOW (ref 36.0–46.0)
Hemoglobin: 10.7 g/dL — ABNORMAL LOW (ref 12.0–15.0)
Immature Granulocytes: 0 %
Lymphocytes Relative: 9 %
Lymphs Abs: 0.4 10*3/uL — ABNORMAL LOW (ref 0.7–4.0)
MCH: 31.6 pg (ref 26.0–34.0)
MCHC: 33.6 g/dL (ref 30.0–36.0)
MCV: 93.8 fL (ref 80.0–100.0)
Monocytes Absolute: 0.1 10*3/uL (ref 0.1–1.0)
Monocytes Relative: 1 %
Neutro Abs: 4.3 10*3/uL (ref 1.7–7.7)
Neutrophils Relative %: 90 %
Platelet Count: 274 10*3/uL (ref 150–400)
RBC: 3.39 MIL/uL — ABNORMAL LOW (ref 3.87–5.11)
RDW: 19.1 % — ABNORMAL HIGH (ref 11.5–15.5)
WBC Count: 4.8 10*3/uL (ref 4.0–10.5)
nRBC: 0 % (ref 0.0–0.2)

## 2023-03-31 LAB — RAD ONC ARIA SESSION SUMMARY
Course Elapsed Days: 70
Plan Fractions Treated to Date: 1
Plan Prescribed Dose Per Fraction: 5.5 Gy
Plan Total Fractions Prescribed: 1
Plan Total Prescribed Dose: 5.5 Gy
Reference Point Dosage Given to Date: 20.8448 Gy
Reference Point Dosage Given to Date: 21.3332 Gy
Reference Point Dosage Given to Date: 22 Gy
Reference Point Dosage Given to Date: 42.4775 Gy
Reference Point Dosage Given to Date: 44.421 Gy
Reference Point Session Dosage Given: 10.7469 Gy
Reference Point Session Dosage Given: 11.1013 Gy
Reference Point Session Dosage Given: 5.3519 Gy
Reference Point Session Dosage Given: 5.3932 Gy
Reference Point Session Dosage Given: 5.5 Gy
Session Number: 34

## 2023-03-31 LAB — CMP (CANCER CENTER ONLY)
ALT: 5 U/L (ref 0–44)
AST: 10 U/L — ABNORMAL LOW (ref 15–41)
Albumin: 3.8 g/dL (ref 3.5–5.0)
Alkaline Phosphatase: 90 U/L (ref 38–126)
Anion gap: 4 — ABNORMAL LOW (ref 5–15)
BUN: 20 mg/dL (ref 6–20)
CO2: 27 mmol/L (ref 22–32)
Calcium: 9.4 mg/dL (ref 8.9–10.3)
Chloride: 105 mmol/L (ref 98–111)
Creatinine: 0.85 mg/dL (ref 0.44–1.00)
GFR, Estimated: >60 mL/min (ref >60)
Glucose, Bld: 140 mg/dL — ABNORMAL HIGH (ref 70–99)
Potassium: 5.1 mmol/L (ref 3.5–5.1)
Sodium: 136 mmol/L (ref 135–145)
Total Bilirubin: 0.2 mg/dL — ABNORMAL LOW (ref 0.3–1.2)
Total Protein: 7.1 g/dL (ref 6.5–8.1)

## 2023-03-31 LAB — PREGNANCY, URINE: Preg Test, Ur: NEGATIVE

## 2023-03-31 LAB — MAGNESIUM: Magnesium: 2 mg/dL (ref 1.7–2.4)

## 2023-03-31 SURGERY — INSERTION, UTERINE TANDEM AND RING OR CYLINDER, FOR BRACHYTHERAPY
Anesthesia: General | Site: Vagina

## 2023-03-31 MED ORDER — LACTATED RINGERS IV SOLN
INTRAVENOUS | Status: DC
  Filled 2023-03-31 (×2): qty 250

## 2023-03-31 MED ORDER — ACETAMINOPHEN 500 MG PO TABS
ORAL_TABLET | ORAL | Status: AC
  Filled 2023-03-31: qty 2

## 2023-03-31 MED ORDER — SODIUM CHLORIDE 0.9% FLUSH
10.0000 mL | Freq: Once | INTRAVENOUS | Status: AC
  Administered 2023-03-31: 10 mL via INTRAVENOUS

## 2023-03-31 MED ORDER — ACETAMINOPHEN 325 MG PO TABS
ORAL_TABLET | ORAL | Status: AC
  Filled 2023-03-31: qty 1

## 2023-03-31 MED ORDER — SODIUM CHLORIDE 0.9 % IR SOLN
Status: DC | PRN
  Administered 2023-03-31: 1000 mL via INTRAVESICAL

## 2023-03-31 MED ORDER — ACETAMINOPHEN 325 MG PO TABS
325.0000 mg | ORAL_TABLET | Freq: Once | ORAL | Status: AC
  Administered 2023-03-31: 325 mg via ORAL

## 2023-03-31 MED ORDER — PROPOFOL 10 MG/ML IV BOLUS
INTRAVENOUS | Status: AC
  Filled 2023-03-31: qty 20

## 2023-03-31 MED ORDER — LIDOCAINE-PRILOCAINE 2.5-2.5 % EX CREA
TOPICAL_CREAM | Freq: Once | CUTANEOUS | Status: AC
  Filled 2023-03-31: qty 5

## 2023-03-31 MED ORDER — ESTRADIOL 0.1 MG/GM VA CREA
TOPICAL_CREAM | VAGINAL | Status: DC | PRN
  Administered 2023-03-31: 1 via VAGINAL

## 2023-03-31 MED ORDER — DEXMEDETOMIDINE HCL IN NACL 80 MCG/20ML IV SOLN
INTRAVENOUS | Status: DC | PRN
  Administered 2023-03-31 (×3): 4 ug via INTRAVENOUS

## 2023-03-31 MED ORDER — FENTANYL CITRATE (PF) 100 MCG/2ML IJ SOLN
INTRAMUSCULAR | Status: AC
  Filled 2023-03-31: qty 2

## 2023-03-31 MED ORDER — PROPOFOL 10 MG/ML IV BOLUS
INTRAVENOUS | Status: DC | PRN
  Administered 2023-03-31: 160 mg via INTRAVENOUS
  Administered 2023-03-31: 40 mg via INTRAVENOUS

## 2023-03-31 MED ORDER — OXYCODONE HCL 5 MG PO TABS
5.0000 mg | ORAL_TABLET | Freq: Once | ORAL | Status: AC
  Administered 2023-03-31: 5 mg via ORAL

## 2023-03-31 MED ORDER — OXYCODONE HCL 5 MG PO TABS
ORAL_TABLET | ORAL | Status: AC
  Filled 2023-03-31: qty 1

## 2023-03-31 MED ORDER — LIDOCAINE HCL (PF) 2 % IJ SOLN
INTRAMUSCULAR | Status: AC
  Filled 2023-03-31: qty 5

## 2023-03-31 MED ORDER — ESTRADIOL 0.1 MG/GM VA CREA
TOPICAL_CREAM | VAGINAL | Status: AC
  Filled 2023-03-31: qty 42.5

## 2023-03-31 MED ORDER — SODIUM CHLORIDE FLUSH 0.9 % IV SOLN: 10.0000 mL | Freq: Once | INTRAVENOUS | Status: DC

## 2023-03-31 MED ORDER — WHITE PETROLATUM EX OINT
TOPICAL_OINTMENT | CUTANEOUS | Status: AC
  Filled 2023-03-31: qty 5

## 2023-03-31 MED ORDER — FENTANYL CITRATE (PF) 100 MCG/2ML IJ SOLN
INTRAMUSCULAR | Status: DC | PRN
  Administered 2023-03-31 (×2): 50 ug via INTRAVENOUS

## 2023-03-31 MED ORDER — ACETAMINOPHEN 500 MG PO TABS
1000.0000 mg | ORAL_TABLET | Freq: Once | ORAL | Status: AC
  Administered 2023-03-31: 1000 mg via ORAL

## 2023-03-31 MED ORDER — LACTATED RINGERS IV SOLN: INTRAVENOUS | Status: DC

## 2023-03-31 MED ORDER — MIDAZOLAM HCL 5 MG/5ML IJ SOLN
INTRAMUSCULAR | Status: DC | PRN
  Administered 2023-03-31: 2 mg via INTRAVENOUS

## 2023-03-31 MED ORDER — FENTANYL CITRATE (PF) 100 MCG/2ML IJ SOLN
25.0000 ug | INTRAMUSCULAR | Status: AC | PRN
  Administered 2023-03-31 (×6): 25 ug via INTRAVENOUS

## 2023-03-31 MED ORDER — ONDANSETRON HCL 4 MG/2ML IJ SOLN
INTRAMUSCULAR | Status: DC | PRN
  Administered 2023-03-31: 4 mg via INTRAVENOUS

## 2023-03-31 MED ORDER — AMISULPRIDE (ANTIEMETIC) 5 MG/2ML IV SOLN: 10.0000 mg | Freq: Once | INTRAVENOUS | Status: DC | PRN

## 2023-03-31 MED ORDER — LIDOCAINE 2% (20 MG/ML) 5 ML SYRINGE
INTRAMUSCULAR | Status: DC | PRN
  Administered 2023-03-31: 80 mg via INTRAVENOUS

## 2023-03-31 MED ORDER — HEPARIN SOD (PORK) LOCK FLUSH 100 UNIT/ML IV SOLN
500.0000 [IU] | Freq: Once | INTRAVENOUS | Status: AC
  Administered 2023-03-31: 500 [IU] via INTRAVENOUS

## 2023-03-31 MED ORDER — ONDANSETRON HCL 4 MG/2ML IJ SOLN
INTRAMUSCULAR | Status: AC
  Filled 2023-03-31: qty 2

## 2023-03-31 MED ORDER — DEXAMETHASONE SODIUM PHOSPHATE 10 MG/ML IJ SOLN
INTRAMUSCULAR | Status: AC
  Filled 2023-03-31: qty 1

## 2023-03-31 MED ORDER — MIDAZOLAM HCL 2 MG/2ML IJ SOLN
INTRAMUSCULAR | Status: AC
  Filled 2023-03-31: qty 2

## 2023-03-31 MED ORDER — DEXAMETHASONE SODIUM PHOSPHATE 10 MG/ML IJ SOLN
INTRAMUSCULAR | Status: DC | PRN
  Administered 2023-03-31: 10 mg via INTRAVENOUS

## 2023-03-31 SURGICAL SUPPLY — 20 items
BNDG CMPR 75X21 PLY HI ABS (MISCELLANEOUS)
DILATOR CANAL MILEX (MISCELLANEOUS) IMPLANT
DRSG TELFA 3X8 NADH STRL (GAUZE/BANDAGES/DRESSINGS) ×1 IMPLANT
GAUZE 4X4 16PLY ~~LOC~~+RFID DBL (SPONGE) ×2 IMPLANT
GAUZE PAD ABD 8X10 STRL (GAUZE/BANDAGES/DRESSINGS) ×1 IMPLANT
GAUZE STRETCH 2X75IN STRL (MISCELLANEOUS) IMPLANT
GLOVE BIO SURGEON STRL SZ7.5 (GLOVE) ×2 IMPLANT
GOWN STRL REUS W/TWL LRG LVL3 (GOWN DISPOSABLE) ×1 IMPLANT
HOLDER FOLEY CATH W/STRAP (MISCELLANEOUS) ×1 IMPLANT
IV NS 1000ML (IV SOLUTION) ×1
IV NS 1000ML BAXH (IV SOLUTION) ×1 IMPLANT
IV SET ADMIN PUMP GEMINI W/NLD (IV SETS) ×1 IMPLANT
KIT TURNOVER CYSTO (KITS) ×1 IMPLANT
MAT PREVALON FULL STRYKER (MISCELLANEOUS) ×1 IMPLANT
PACK VAGINAL MINOR WOMEN LF (CUSTOM PROCEDURE TRAY) ×1 IMPLANT
PACKING VAGINAL (PACKING) IMPLANT
SLEEVE SCD COMPRESS KNEE MED (STOCKING) ×1 IMPLANT
TOWEL OR 17X24 6PK STRL BLUE (TOWEL DISPOSABLE) ×1 IMPLANT
TRAY FOLEY W/BAG SLVR 14FR LF (SET/KITS/TRAYS/PACK) ×1 IMPLANT
WATER STERILE IRR 500ML POUR (IV SOLUTION) ×1 IMPLANT

## 2023-03-31 NOTE — Interval H&P Note (Signed)
History and Physical Interval Note:  03/31/2023 7:14 AM  Lorraine Yates  has presented today for surgery, with the diagnosis of EXOCERVIX CANCER.  The various methods of treatment have been discussed with the patient and family. After consideration of risks, benefits and other options for treatment, the patient has consented to  Procedure(s): TANDEM RING INSERTION (N/A) OPERATIVE ULTRASOUND (N/A) as a surgical intervention.  The patient's history has been reviewed, patient examined, no change in status, stable for surgery.  I have reviewed the patient's chart and labs.  Questions were answered to the patient's satisfaction.     Antony Blackbird

## 2023-03-31 NOTE — Transfer of Care (Signed)
Immediate Anesthesia Transfer of Care Note  Patient: Lorraine Yates  Procedure(s) Performed: TANDEM RING INSERTION (Vagina ) OPERATIVE ULTRASOUND (Vagina )  Patient Location: PACU  Anesthesia Type:General  Level of Consciousness: awake, alert , and oriented  Airway & Oxygen Therapy: Patient Spontanous Breathing and Patient connected to nasal cannula oxygen  Post-op Assessment: Report given to RN and Post -op Vital signs reviewed and stable  Post vital signs: Reviewed and stable  Last Vitals:  Vitals Value Taken Time  BP 155/94 03/31/23 0819  Temp    Pulse 74 03/31/23 0821  Resp 15 03/31/23 0821  SpO2 100 % 03/31/23 0821  Vitals shown include unfiled device data.  Last Pain:  Vitals:   03/31/23 0623  TempSrc: Oral  PainSc: 5       Patients Stated Pain Goal: 5 (03/31/23 5956)  Complications: No notable events documented.

## 2023-03-31 NOTE — Op Note (Signed)
03/31/2023  8:40 AM  PATIENT:  Lorraine Yates  52 y.o. female  PRE-OPERATIVE DIAGNOSIS:  EXOCERVIX CANCER  POST-OPERATIVE DIAGNOSIS:  EXOCERVIX CANCER  PROCEDURE:  Procedure(s): TANDEM RING INSERTION (N/A) OPERATIVE ULTRASOUND (N/A)  SURGEON:  Surgeons and Role:    * Antony Blackbird, MD - Primary  PHYSICIAN ASSISTANT:   ASSISTANTS: none   ANESTHESIA:   general  EBL: 2 mL  BLOOD ADMINISTERED:none  DRAINS: Urinary Catheter (Foley)   LOCAL MEDICATIONS USED:  NONE  SPECIMEN:  No Specimen  DISPOSITION OF SPECIMEN:  N/A  COUNTS:  YES  TOURNIQUET:  * No tourniquets in log *  DICTATION: . She was taken to the [facility] surgical [facility] room #4.  The patient was prepped and draped in the usual sterile fashion and placed in the dorsolithotomy position.  A Foley catheter was placed and backfilled with approximately 200 cc of sterile water for ultrasound imaging.  Timeout through the procedure, preoperative medications and allergies was performed.   The patient then proceeded to undergo exam under anesthesia.  Significant reduction of the cervical mass was noted.  The residual tumor was flush with the right vaginal fornix.  There was some thickening noted in the right parametrial area but no obvious parametrial involvement.  Erythema was noted to the anterior lip of the cervix some thickening noted.  Cervical mass on exam was approximately 3.0 x 3.0 cm.  The uterus was anteverted on trans abdominal ultrasound.  The uterus length was approximately 8 cm.  She proceeded to undergo sounding followed by dilation of the cervical os.  She then had a 60 mm 45 degree tandem with cervical sleeve placed within the endometrial cavity and endocervical canal.  Trans abdominal ultrasound documented good placement of the treatment equipment.  A 45 degree ring with large shielding cap was placed and attached to the tandem.  The patient then had placement of a rectal paddle posteriorly to limit high-dose  radiation therapy to the rectal mucosa.  In addition vaginal packing was placed posterior to the rectal paddle to lower rectal point doses.  This packing was soaked in Estrace cream to aid in placement.  The patient tolerated the procedure well.  She was subsequently transported to recovery room in stable condition.  Later in the day the patient will be brought down to radiation oncology for planning and treatment for her fourth high-dose-rate treatment.  She is to receive high-dose-rate brachytherapy treatment using iridium 192.  Prescription will be 5.5 Gy to the high risk clinical target volume.  She is to receive 1 subsequent high-dose-rate treatments to complete her definitive course of radiation therapy.   PLAN OF CARE:  Transfer to radiation oncology for planning and treatment  PATIENT DISPOSITION:  PACU - hemodynamically stable.   Delay start of Pharmacological VTE agent (>24hrs) due to surgical blood loss or risk of bleeding: not applicable

## 2023-03-31 NOTE — Patient Instructions (Signed)
IMMEDIATELY FOLLOWING SURGERY: Do not drive or operate machinery for the first twenty four hours after surgery. Do not make any important decisions for twenty four hours after surgery or while taking narcotic pain medications or sedatives. If you develop intractable nausea and vomiting or a severe headache please notify your doctor immediately.   FOLLOW-UP: You do not need to follow up with anesthesia unless specifically instructed to do so.   WOUND CARE INSTRUCTIONS (if applicable): Expect some mild vaginal bleeding, but if large amount of bleeding occurs please contact Dr. Roselind Messier at [phone] or the Radiation On-Call physician. Call for any fever greater than 101.0 degrees or increasing vaginal//abdominal pain or trouble urinating.   QUESTIONS?: Please feel free to call your physician or the hospital operator if you have any questions, and they will be happy to assist you. Resume all medications: as listed on your after visit summary. Your next appointment is:  Future Appointments  Date Time Provider Department Center  03/31/2023  1:30 PM [facility] FLUSH [facility] None  03/31/2023  2:00 PM Antony Blackbird, MD [facility] None  04/01/2023  8:30 AM [facility] INFUSION [facility] None  04/01/2023  9:40 AM Artis Delay, MD [facility] None  04/07/2023  7:00 AM [facility]-US 1 WL-US [place]  04/07/2023 10:00 AM Antony Blackbird, MD [facility] None  04/07/2023  2:00 PM Antony Blackbird, MD [facility] None  04/22/2023 10:30 AM [facility] FLUSH [facility] None  04/22/2023 11:20 AM Artis Delay, MD [facility] None  04/22/2023 12:30 PM [facility] INFUSION [facility] None

## 2023-03-31 NOTE — Anesthesia Postprocedure Evaluation (Signed)
Anesthesia Post Note  Patient: Lorraine Yates  Procedure(s) Performed: TANDEM RING INSERTION (Vagina ) OPERATIVE ULTRASOUND (Vagina )     Patient location during evaluation: PACU Anesthesia Type: General Level of consciousness: awake and alert Pain management: pain level controlled Vital Signs Assessment: post-procedure vital signs reviewed and stable Respiratory status: spontaneous breathing, nonlabored ventilation, respiratory function stable and patient connected to nasal cannula oxygen Cardiovascular status: blood pressure returned to baseline and stable Postop Assessment: no apparent nausea or vomiting Anesthetic complications: no  No notable events documented.  Last Vitals:  Vitals:   03/31/23 0900 03/31/23 0915  BP: (!) 142/84 (!) 140/89  Pulse: 78 79  Resp: 17 18  Temp: 37 C   SpO2: 100% 98%    Last Pain:  Vitals:   03/31/23 0915  TempSrc:   PainSc: 3                  Kennieth Rad

## 2023-03-31 NOTE — Anesthesia Procedure Notes (Signed)
Procedure Name: LMA Insertion Date/Time: 03/31/2023 7:41 AM  Performed by: Everline Mahaffy D, CRNAPre-anesthesia Checklist: Patient identified, Emergency Drugs available, Suction available and Patient being monitored Patient Re-evaluated:Patient Re-evaluated prior to induction Oxygen Delivery Method: Circle system utilized Preoxygenation: Pre-oxygenation with 100% oxygen Induction Type: IV induction Ventilation: Mask ventilation without difficulty LMA: LMA inserted LMA Size: 4.0 Tube type: Oral Number of attempts: 1 Placement Confirmation: positive ETCO2 and breath sounds checked- equal and bilateral Tube secured with: Tape Dental Injury: Teeth and Oropharynx as per pre-operative assessment

## 2023-03-31 NOTE — Anesthesia Preprocedure Evaluation (Signed)
Anesthesia Evaluation  Patient identified by MRN, date of birth, ID band Patient awake    Reviewed: Allergy & Precautions, NPO status , Patient's Chart, lab work & pertinent test results  Airway Mallampati: III  TM Distance: >3 FB Neck ROM: Full    Dental no notable dental hx. (+) Dental Advisory Given, Teeth Intact   Pulmonary Current Smoker and Patient abstained from smoking.   Pulmonary exam normal breath sounds clear to auscultation       Cardiovascular Normal cardiovascular exam Rhythm:Regular Rate:Normal     Neuro/Psych    GI/Hepatic   Endo/Other    Renal/GU      Musculoskeletal   Abdominal   Peds  Hematology  (+) Blood dyscrasia (hgb 9.8), anemia   Anesthesia Other Findings Cervical cancer  Reproductive/Obstetrics                              Anesthesia Physical Anesthesia Plan  ASA: 3  Anesthesia Plan: General   Post-op Pain Management: Tylenol PO (pre-op)*   Induction: Intravenous  PONV Risk Score and Plan: 1 and Ondansetron, Treatment may vary due to age or medical condition, Midazolam and Dexamethasone  Airway Management Planned: LMA  Additional Equipment: None  Intra-op Plan:   Post-operative Plan: Extubation in OR  Informed Consent: I have reviewed the patients History and Physical, chart, labs and discussed the procedure including the risks, benefits and alternatives for the proposed anesthesia with the patient or authorized representative who has indicated his/her understanding and acceptance.     Dental advisory given  Plan Discussed with: CRNA  Anesthesia Plan Comments:          Anesthesia Quick Evaluation

## 2023-04-01 ENCOUNTER — Ambulatory Visit: Payer: BC Managed Care – PPO | Admitting: Radiation Oncology

## 2023-04-01 ENCOUNTER — Ambulatory Visit: Payer: BC Managed Care – PPO | Admitting: Hematology and Oncology

## 2023-04-01 ENCOUNTER — Inpatient Hospital Stay (HOSPITAL_BASED_OUTPATIENT_CLINIC_OR_DEPARTMENT_OTHER): Payer: BC Managed Care – PPO | Admitting: Hematology and Oncology

## 2023-04-01 ENCOUNTER — Ambulatory Visit (HOSPITAL_COMMUNITY): Payer: BC Managed Care – PPO

## 2023-04-01 ENCOUNTER — Inpatient Hospital Stay: Payer: BC Managed Care – PPO

## 2023-04-01 ENCOUNTER — Encounter (HOSPITAL_BASED_OUTPATIENT_CLINIC_OR_DEPARTMENT_OTHER): Payer: Self-pay | Admitting: Radiation Oncology

## 2023-04-01 VITALS — BP 120/87 | HR 81 | Temp 98.8°F | Resp 16

## 2023-04-01 DIAGNOSIS — C531 Malignant neoplasm of exocervix: Secondary | ICD-10-CM

## 2023-04-01 DIAGNOSIS — D61818 Other pancytopenia: Secondary | ICD-10-CM

## 2023-04-01 DIAGNOSIS — K5909 Other constipation: Secondary | ICD-10-CM | POA: Diagnosis not present

## 2023-04-01 DIAGNOSIS — Z5112 Encounter for antineoplastic immunotherapy: Secondary | ICD-10-CM | POA: Diagnosis present

## 2023-04-01 DIAGNOSIS — Z3202 Encounter for pregnancy test, result negative: Secondary | ICD-10-CM | POA: Diagnosis not present

## 2023-04-01 DIAGNOSIS — C539 Malignant neoplasm of cervix uteri, unspecified: Secondary | ICD-10-CM | POA: Diagnosis present

## 2023-04-01 MED ORDER — SODIUM CHLORIDE 0.9 % IV SOLN
200.0000 mg | Freq: Once | INTRAVENOUS | Status: AC
  Administered 2023-04-01: 200 mg via INTRAVENOUS
  Filled 2023-04-01: qty 200

## 2023-04-01 MED ORDER — SODIUM CHLORIDE 0.9 % IV SOLN: Freq: Once | INTRAVENOUS | Status: AC

## 2023-04-01 MED ORDER — HEPARIN SOD (PORK) LOCK FLUSH 100 UNIT/ML IV SOLN
500.0000 [IU] | Freq: Once | INTRAVENOUS | Status: AC | PRN
  Administered 2023-04-01: 500 [IU]

## 2023-04-01 MED ORDER — SODIUM CHLORIDE 0.9% FLUSH
10.0000 mL | INTRAVENOUS | Status: DC | PRN
  Administered 2023-04-01: 10 mL

## 2023-04-01 NOTE — Assessment & Plan Note (Signed)
This is resolving

## 2023-04-01 NOTE — Assessment & Plan Note (Signed)
She denies recent bleeding except after recent treatment Overall, she is recovering well from side effects of treatment I will continue to see her in she will receive pembrolizumab  I plan to order PET/CT imaging study 3 months after completion of radiation therapy

## 2023-04-01 NOTE — Assessment & Plan Note (Signed)
We discussed importance of aggressive laxative therapy while on pain medicine.

## 2023-04-01 NOTE — Progress Notes (Signed)
[facility] OFFICE PROGRESS NOTE  Patient Care Team: Patient, No Pcp Per as PCP - General (General Practice) Artis Delay, MD as Consulting Physician (Hematology and Oncology)  ASSESSMENT & PLAN:  Cervical cancer [facility]) She denies recent bleeding except after recent treatment Overall, she is recovering well from side effects of treatment I will continue to see her in she will receive pembrolizumab  I plan to order PET/CT imaging study 3 months after completion of radiation therapy  Other constipation We discussed importance of aggressive laxative therapy while on pain medicine.   Pancytopenia, acquired (HCC) This is resolving  No orders of the defined types were placed in this encounter.   All questions were answered. The patient knows to call the clinic with any problems, questions or concerns. The total time spent in the appointment was 20 minutes encounter with patients including review of chart and various tests results, discussions about plan of care and coordination of care plan   Artis Delay, MD 04/01/2023 12:59 PM  INTERVAL HISTORY: Please see below for problem oriented charting. she returns for treatment follow-up She is seen in the infusion room She is getting better No vaginal bleeding She has minimum pain No side effects from recent treatment  REVIEW OF SYSTEMS:   Constitutional: Denies fevers, chills or abnormal weight loss Eyes: Denies blurriness of vision Ears, nose, mouth, throat, and face: Denies mucositis or sore throat Respiratory: Denies cough, dyspnea or wheezes Cardiovascular: Denies palpitation, chest discomfort or lower extremity swelling Gastrointestinal:  Denies nausea, heartburn or change in bowel habits Skin: Denies abnormal skin rashes Lymphatics: Denies new lymphadenopathy or easy bruising Neurological:Denies numbness, tingling or new weaknesses Behavioral/Psych: Mood is stable, no new changes  All other systems were reviewed  with the patient and are negative.  I have reviewed the past medical history, past surgical history, social history and family history with the patient and they are unchanged from previous note.  ALLERGIES:  is allergic to demerol [meperidine hcl] and hydromorphone.  MEDICATIONS:  Current Outpatient Medications  Medication Sig Dispense Refill   lidocaine-prilocaine (EMLA) cream Apply 1 Application topically as needed. Apply to port site 1 hour to 30 minutes before port is accessed 30 g 1   oxyCODONE (OXY IR/ROXICODONE) 5 MG immediate release tablet Take 1 tablet (5 mg) by mouth every 4 hours as needed for severe pain. 60 tablet 0   prochlorperazine (COMPAZINE) 10 MG tablet TAKE 1 TABLET(10 MG) BY MOUTH EVERY 6 HOURS AS NEEDED FOR NAUSEA OR VOMITING 30 tablet 1   No current facility-administered medications for this visit.    SUMMARY OF ONCOLOGIC HISTORY: Oncology History Overview Note  PD-L1 CPS 1%   Cervical cancer (HCC)  11/24/2022 Initial Diagnosis   The patient presented recently to the emergency department with both vaginal bleeding and rectal bleeding.  Exam in the emergency department showed a 6 cm irregular cervical mass.     11/24/2022 Imaging   CT pelvis 1. Heterogeneous cervical mass is highly worrisome for cervical carcinoma. Significant border of contact with the adjacent rectum. Difficult to exclude invasion. 2. Locule of air within the cervical mass may be due to necrosis or extension from the vagina. Difficult to exclude a fistula to the rectum.   11/24/2022 Imaging   US pelvis 1. Large, approximately 7.6 cm vascular soft tissue mass of the lower uterine segment. This seems inseparable from the cervix and Cervical Carcinoma is not excluded. Alternatively this might be a large lower uterine fibroid. Recommend direct  visualization of the cervix.   2. Elsewhere the uterus and endometrium are within normal limits. Normal ovaries and no free fluid.   11/26/2022 Pathology  Results   SURGICAL PATHOLOGY  CASE: [id number]  PATIENT: Tymber Charo  Surgical Pathology Report   FINAL MICROSCOPIC DIAGNOSIS:   A. 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  See comment.   COMMENT:   - Morphologic evaluation and p16 immunohistochemical stain reveal superficial squamous mucosa with findings consistent with high-grade squamous intraepithelial lesion.  The patient's clinical history of a cervical mass is noted, and a deeper more invasive process cannot be excluded.  Clinical and imaging correlation is recommended.      11/29/2022 Initial Diagnosis   Cervical cancer (HCC)   12/02/2022 Cancer Staging   Staging form: Cervix Uteri, AJCC Version 9 - Clinical stage from 12/02/2022: Stage IIIC1 (cT1b3, cN1, cM0) - Signed by Artis Delay, MD on 12/15/2022 Stage prefix: Initial diagnosis   12/15/2022 PET scan   1. Hypermetabolic cervical mass consistent with known cervical carcinoma. 2. There is a mildly FDG avid left pelvic sidewall lymph node which is equivocal for nodal metastasis. 3. No additional sites of disease identified. 4. Aortic Atherosclerosis (ICD10-I70.0).     12/16/2022 Procedure   Successful placement of a right internal jugular approach power injectable Port-A-Cath. The catheter is ready for immediate use.     12/17/2022 Pathology Results   SURGICAL PATHOLOGY  CASE: [id number]  PATIENT: Ghalia Dorgan  Surgical Pathology Report   Clinical History: malignant neoplasm of cervix, unspecified site   FINAL MICROSCOPIC DIAGNOSIS:   A. 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     12/18/2022 Imaging   1. The normal cervical anatomy is completely effaced by a bulky mass measuring 7.9 x 6.9 x 5.5 cm. Mass extends into the lower uterine segment with loss of parametrial definition about  the posterior 180 degrees of the upper portion of the mass. Mass remains confined to the upper third of the vagina. 2. Preserved fat planes to the adjacent bladder and rectum. No ureteral invasion or hydronephrosis. 3. Small left pelvic sidewall lymph node measuring 0.9 x 0.6 cm, previously with equivocal FDG avidity and suspicious for a small nodal metastasis. No other enlarged lymph nodes in the pelvis.     01/20/2023 - 01/20/2023 Chemotherapy   Patient is on Treatment Plan : HEAD/NECK Cisplatin (40) q7d     01/20/2023 -  Chemotherapy   Patient is on Treatment Plan : Cervical Pembrolizumab (200) q21d, cisplatin (40) q7d + XRT / pembrolizumab (400) q42d        PHYSICAL EXAMINATION: ECOG PERFORMANCE STATUS: 0 - Asymptomatic  GENERAL:alert, no distress and comfortable   LABORATORY DATA:  I have reviewed the data as listed    Component Value Date/Time   NA 136 03/31/2023 1118   K 5.1 03/31/2023 1118   CL 105 03/31/2023 1118   CO2 27 03/31/2023 1118   GLUCOSE 140 (H) 03/31/2023 1118   BUN 20 03/31/2023 1118   CREATININE 0.85 03/31/2023 1118   CALCIUM 9.4 03/31/2023 1118   PROT 7.1 03/31/2023 1118   ALBUMIN 3.8 03/31/2023 1118   AST 10 (L) 03/31/2023 1118   ALT 5 03/31/2023 1118   ALKPHOS 90 03/31/2023 1118   BILITOT 0.2 (L) 03/31/2023 1118   GFRNONAA >60 03/31/2023 1118    No results found for: "  SPEP", "UPEP"  Lab Results  Component Value Date   WBC 4.8 03/31/2023   NEUTROABS 4.3 03/31/2023   HGB 10.7 (L) 03/31/2023   HCT 31.8 (L) 03/31/2023   MCV 93.8 03/31/2023   PLT 274 03/31/2023      Chemistry      Component Value Date/Time   NA 136 03/31/2023 1118   K 5.1 03/31/2023 1118   CL 105 03/31/2023 1118   CO2 27 03/31/2023 1118   BUN 20 03/31/2023 1118   CREATININE 0.85 03/31/2023 1118      Component Value Date/Time   CALCIUM 9.4 03/31/2023 1118   ALKPHOS 90 03/31/2023 1118   AST 10 (L) 03/31/2023 1118   ALT 5 03/31/2023 1118   BILITOT 0.2 (L) 03/31/2023  1118

## 2023-04-01 NOTE — Patient Instructions (Signed)
[place] [facility]  Discharge Instructions: Thank you for choosing [facility] to provide your oncology and hematology care.   If you have a lab appointment with the Cancer Center, please go directly to the Cancer Center and check in at the registration area.   Wear comfortable clothing and clothing appropriate for easy access to any Portacath or PICC line.   We strive to give you quality time with your provider. You may need to reschedule your appointment if you arrive late (15 or more minutes).  Arriving late affects you and other patients whose appointments are after yours.  Also, if you miss three or more appointments without notifying the office, you may be dismissed from the clinic at the provider's discretion.      For prescription refill requests, have your pharmacy contact our office and allow 72 hours for refills to be completed.    Today you received the following chemotherapy and/or immunotherapy agents: Keytruda      To help prevent nausea and vomiting after your treatment, we encourage you to take your nausea medication as directed.  BELOW ARE SYMPTOMS THAT SHOULD BE REPORTED IMMEDIATELY: *FEVER GREATER THAN 100.4 F (38 C) OR HIGHER *CHILLS OR SWEATING *NAUSEA AND VOMITING THAT IS NOT CONTROLLED WITH YOUR NAUSEA MEDICATION *UNUSUAL SHORTNESS OF BREATH *UNUSUAL BRUISING OR BLEEDING *URINARY PROBLEMS (pain or burning when urinating, or frequent urination) *BOWEL PROBLEMS (unusual diarrhea, constipation, pain near the anus) TENDERNESS IN MOUTH AND THROAT WITH OR WITHOUT PRESENCE OF ULCERS (sore throat, sores in mouth, or a toothache) UNUSUAL RASH, SWELLING OR PAIN  UNUSUAL VAGINAL DISCHARGE OR ITCHING   Items with * indicate a potential emergency and should be followed up as soon as possible or go to the Emergency Department if any problems should occur.  Please show the CHEMOTHERAPY ALERT CARD or IMMUNOTHERAPY ALERT CARD at  check-in to the Emergency Department and triage nurse.  Should you have questions after your visit or need to cancel or reschedule your appointment, please contact [place] [facility]  Dept: [phone]  and follow the prompts.  Office hours are 8:00 a.m. to 4:30 p.m. Monday - Friday. Please note that voicemails left after 4:00 p.m. may not be returned until the following business day.  We are closed weekends and major holidays. You have access to a nurse at all times for urgent questions. Please call the main number to the clinic Dept: [phone] and follow the prompts.   For any non-urgent questions, you may also contact your provider using MyChart. We now offer e-Visits for anyone 69 and older to request care online for non-urgent symptoms. For details visit [email or web address].   Also download the MyChart app! Go to the app store, search "MyChart", open the app, select , and log in with your MyChart username and password.

## 2023-04-04 ENCOUNTER — Encounter (HOSPITAL_BASED_OUTPATIENT_CLINIC_OR_DEPARTMENT_OTHER): Payer: Self-pay | Admitting: Radiation Oncology

## 2023-04-04 NOTE — Progress Notes (Signed)
Spoke w/ via phone for pre-op interview--- pt Lab needs dos----  no Lab results------ current lb results in epic dated 03-31-2023 CBCdiff/ CMP/ negative UPT COVID test -----patient states asymptomatic no test needed Arrive at ------- 0530 on 04-07-2023 NPO after MN NO Solid Food.  Clear liquids from MN until--- 0430 Med rec completed Medications to take morning of surgery ----- none Diabetic medication ----- n/a Patient instructed no nail polish to be worn day of surgery Patient instructed to bring photo id and insurance card day of surgery Patient aware to have Driver (ride ) / caregiver    for 24 hours after surgery - husband, Lorraine Yates Patient Special Instructions ----- n/a Pre-Op special Instructions ----- called and left message for Lorraine Yates, office staff for dr Lorraine Yates, requested pre-op orders Patient verbalized understanding of instructions that were given at this phone interview. Patient denies chest pain, sob, fever, cough at the interview.

## 2023-04-05 ENCOUNTER — Other Ambulatory Visit: Payer: Self-pay

## 2023-04-06 NOTE — Anesthesia Preprocedure Evaluation (Signed)
Anesthesia Evaluation  Patient identified by MRN, date of birth, ID band Patient awake    Reviewed: Allergy & Precautions, H&P , NPO status , Patient's Chart, lab work & pertinent test results  Airway Mallampati: III  TM Distance: >3 FB Neck ROM: Full    Dental no notable dental hx. (+) Teeth Intact, Dental Advisory Given   Pulmonary Current Smoker and Patient abstained from smoking.   Pulmonary exam normal breath sounds clear to auscultation       Cardiovascular Exercise Tolerance: Good negative cardio ROS  Rhythm:Regular Rate:Normal     Neuro/Psych negative neurological ROS  negative psych ROS   GI/Hepatic negative GI ROS, Neg liver ROS,,,  Endo/Other  negative endocrine ROS    Renal/GU negative Renal ROS  negative genitourinary   Musculoskeletal   Abdominal   Peds  Hematology  (+) Blood dyscrasia, anemia   Anesthesia Other Findings   Reproductive/Obstetrics negative OB ROS                             Anesthesia Physical Anesthesia Plan  ASA: 2  Anesthesia Plan: General   Post-op Pain Management: Tylenol PO (pre-op)* and Toradol IV (intra-op)*   Induction: Intravenous  PONV Risk Score and Plan: 3 and Ondansetron, Dexamethasone and Midazolam  Airway Management Planned: LMA  Additional Equipment:   Intra-op Plan:   Post-operative Plan: Extubation in OR  Informed Consent: I have reviewed the patients History and Physical, chart, labs and discussed the procedure including the risks, benefits and alternatives for the proposed anesthesia with the patient or authorized representative who has indicated his/her understanding and acceptance.     Dental advisory given  Plan Discussed with: CRNA  Anesthesia Plan Comments:        Anesthesia Quick Evaluation

## 2023-04-06 NOTE — Progress Notes (Signed)
Radiation Oncology         [phone] ________________________________  Name: Lorraine Yates MRN: [id number]  Date: 04/07/2023  DOB: 06/23/1971  CC: Patient, No Pcp Per  Carver Fila, MD  HDR BRACHYTHERAPY NOTE  DIAGNOSIS: Stage IIIc, squamous cell carcinoma of the cervix.    Cancer Staging  Cervical cancer [facility]) Staging form: Cervix Uteri, AJCC Version 9 - Clinical stage from 12/02/2022: Stage IIIC1 (cT1b3, cN1, cM0) - Signed by Artis Delay, MD on 12/15/2022  NARRATIVE: The patient was brought to the HDR suite. Identity was confirmed. All relevant records and images related to the planned course of therapy were reviewed. The patient freely provided informed written consent to proceed with treatment after reviewing the details related to the planned course of therapy. The consent form was witnessed and verified by the simulation staff. Then, the patient was set-up in a stable reproducible supine position for radiation therapy. The tandem ring system was accessed and fiducial markers were placed within the tandem and ring.   Simple treatment device note: On the operating room the patient had construction of her custom tandem ring system. She will be treated with a 45 tandem/ring system. The patient had placement of a 60 mm tandem. A cervical ring with a large shielding was used for her treatment. A rectal paddle was also part of her custom set up device.  Vaginal packing soaked in Estrace cream was also placed posterior to the rectal paddle to lower rectal doses even further.  Verification simulation note:  An AP and lateral film was obtained through the pelvis area. This was compared to the patient's planning films documenting accurate position of the tandem/ring system for treatment.   High-dose-rate brachytherapy treatment note:   The remote afterloading device was accessed through catheter system and attached to the tandem ring system. Patient then proceeded to undergo her fifth  high-dose-rate treatment directed at the cervix. The patient was prescribed a dose of 5.5 gray to be delivered to the high risk clinical target volume.. Patient was treated with 2 channels using 26 dwell positions. Treatment time was *** seconds. The patient tolerated the procedure well. After completion of her therapy, a radiation survey was performed documenting return of the iridium source into the [employer] safe. The patient was then transferred to the nursing suite.  She then had removal of the rectal paddle followed by the tandem and ring system. The patient tolerated the removal well.  PLAN: The patient has completed her fifth and final high-dose-rate treatment. She tolerated treatment relatively well overall without any significant side effects ***. She will return here for routine follow-up in one month.  ________________________________   -----------------------------------  Billie Lade, PhD, MD  This document serves as a record of services personally performed by Antony Blackbird, MD. It was created on his behalf by Neena Rhymes, a trained medical scribe. The creation of this record is based on the scribe's personal observations and the provider's statements to them. This document has been checked and approved by the attending provider.

## 2023-04-07 ENCOUNTER — Encounter (HOSPITAL_BASED_OUTPATIENT_CLINIC_OR_DEPARTMENT_OTHER)
Admission: RE | Disposition: A | Discharge status: Other Acute Inpatient Hospital | Payer: Self-pay | Source: Home / Self Care | Attending: Radiation Oncology

## 2023-04-07 ENCOUNTER — Ambulatory Visit
Admission: RE | Admit: 2023-04-07 | Discharge: 2023-04-07 | Disposition: A | Discharge status: Home / Self Care | Payer: BC Managed Care – PPO | Source: Ambulatory Visit | Attending: Radiation Oncology | Admitting: Radiation Oncology

## 2023-04-07 ENCOUNTER — Ambulatory Visit (HOSPITAL_BASED_OUTPATIENT_CLINIC_OR_DEPARTMENT_OTHER): Payer: Self-pay | Admitting: Anesthesiology

## 2023-04-07 ENCOUNTER — Ambulatory Visit (HOSPITAL_BASED_OUTPATIENT_CLINIC_OR_DEPARTMENT_OTHER)
Admission: RE | Admit: 2023-04-07 | Discharge: 2023-04-07 | Disposition: A | Discharge status: Other Acute Inpatient Hospital | Payer: BC Managed Care – PPO | Attending: Radiation Oncology | Admitting: Radiation Oncology

## 2023-04-07 ENCOUNTER — Other Ambulatory Visit: Payer: Self-pay

## 2023-04-07 ENCOUNTER — Ambulatory Visit (HOSPITAL_COMMUNITY)
Admission: RE | Admit: 2023-04-07 | Discharge: 2023-04-07 | Disposition: A | Discharge status: Home / Self Care | Payer: BC Managed Care – PPO | Source: Ambulatory Visit | Attending: Radiation Oncology | Admitting: Radiation Oncology

## 2023-04-07 ENCOUNTER — Encounter (HOSPITAL_BASED_OUTPATIENT_CLINIC_OR_DEPARTMENT_OTHER): Payer: Self-pay | Admitting: Radiation Oncology

## 2023-04-07 VITALS — BP 110/73 | HR 73 | Resp 18

## 2023-04-07 DIAGNOSIS — F17210 Nicotine dependence, cigarettes, uncomplicated: Secondary | ICD-10-CM | POA: Diagnosis not present

## 2023-04-07 DIAGNOSIS — Z923 Personal history of irradiation: Secondary | ICD-10-CM | POA: Diagnosis not present

## 2023-04-07 DIAGNOSIS — Z9221 Personal history of antineoplastic chemotherapy: Secondary | ICD-10-CM | POA: Diagnosis not present

## 2023-04-07 DIAGNOSIS — C538 Malignant neoplasm of overlapping sites of cervix uteri: Secondary | ICD-10-CM

## 2023-04-07 DIAGNOSIS — Z8541 Personal history of malignant neoplasm of cervix uteri: Secondary | ICD-10-CM

## 2023-04-07 DIAGNOSIS — D649 Anemia, unspecified: Secondary | ICD-10-CM | POA: Insufficient documentation

## 2023-04-07 DIAGNOSIS — Z01818 Encounter for other preprocedural examination: Secondary | ICD-10-CM

## 2023-04-07 DIAGNOSIS — C539 Malignant neoplasm of cervix uteri, unspecified: Secondary | ICD-10-CM | POA: Insufficient documentation

## 2023-04-07 DIAGNOSIS — N888 Other specified noninflammatory disorders of cervix uteri: Secondary | ICD-10-CM

## 2023-04-07 HISTORY — PX: OPERATIVE ULTRASOUND: SHX5996

## 2023-04-07 HISTORY — PX: TANDEM RING INSERTION: SHX6199

## 2023-04-07 LAB — RAD ONC ARIA SESSION SUMMARY
Course Elapsed Days: 77
Plan Fractions Treated to Date: 1
Plan Prescribed Dose Per Fraction: 5.5 Gy
Plan Total Fractions Prescribed: 1
Plan Total Prescribed Dose: 5.5 Gy
Reference Point Dosage Given to Date: 26.0558 Gy
Reference Point Dosage Given to Date: 26.6279 Gy
Reference Point Dosage Given to Date: 27.5 Gy
Reference Point Dosage Given to Date: 53.3206 Gy
Reference Point Dosage Given to Date: 55.0979 Gy
Reference Point Session Dosage Given: 10.6769 Gy
Reference Point Session Dosage Given: 10.8431 Gy
Reference Point Session Dosage Given: 5.211 Gy
Reference Point Session Dosage Given: 5.2947 Gy
Reference Point Session Dosage Given: 5.5 Gy
Session Number: 35

## 2023-04-07 SURGERY — INSERTION, UTERINE TANDEM AND RING OR CYLINDER, FOR BRACHYTHERAPY
Anesthesia: General | Site: Vagina

## 2023-04-07 MED ORDER — FENTANYL CITRATE (PF) 100 MCG/2ML IJ SOLN
25.0000 ug | INTRAMUSCULAR | Status: DC | PRN
  Administered 2023-04-07 (×3): 25 ug via INTRAVENOUS

## 2023-04-07 MED ORDER — FENTANYL CITRATE (PF) 100 MCG/2ML IJ SOLN
INTRAMUSCULAR | Status: DC | PRN
  Administered 2023-04-07 (×2): 25 ug via INTRAVENOUS
  Administered 2023-04-07: 50 ug via INTRAVENOUS

## 2023-04-07 MED ORDER — LIDOCAINE 2% (20 MG/ML) 5 ML SYRINGE
INTRAMUSCULAR | Status: DC | PRN
  Administered 2023-04-07: 60 mg via INTRAVENOUS

## 2023-04-07 MED ORDER — OXYCODONE HCL 5 MG PO TABS
ORAL_TABLET | ORAL | Status: AC
  Filled 2023-04-07: qty 1

## 2023-04-07 MED ORDER — ONDANSETRON HCL 4 MG/2ML IJ SOLN
INTRAMUSCULAR | Status: DC | PRN
Start: 2023-04-07 — End: 2023-04-07
  Administered 2023-04-07: 4 mg via INTRAVENOUS

## 2023-04-07 MED ORDER — FENTANYL CITRATE (PF) 100 MCG/2ML IJ SOLN
INTRAMUSCULAR | Status: AC
  Filled 2023-04-07: qty 2

## 2023-04-07 MED ORDER — LIDOCAINE HCL (PF) 2 % IJ SOLN
INTRAMUSCULAR | Status: AC
  Filled 2023-04-07: qty 5

## 2023-04-07 MED ORDER — DEXAMETHASONE SODIUM PHOSPHATE 10 MG/ML IJ SOLN
INTRAMUSCULAR | Status: DC | PRN
  Administered 2023-04-07 (×2): 5 mg via INTRAVENOUS

## 2023-04-07 MED ORDER — OXYCODONE HCL 5 MG PO TABS
5.0000 mg | ORAL_TABLET | Freq: Once | ORAL | Status: AC
  Administered 2023-04-07: 5 mg via ORAL

## 2023-04-07 MED ORDER — ACETAMINOPHEN 500 MG PO TABS
1000.0000 mg | ORAL_TABLET | Freq: Once | ORAL | Status: AC
  Administered 2023-04-07: 1000 mg via ORAL

## 2023-04-07 MED ORDER — MIDAZOLAM HCL 2 MG/2ML IJ SOLN
INTRAMUSCULAR | Status: DC | PRN
  Administered 2023-04-07: 2 mg via INTRAVENOUS

## 2023-04-07 MED ORDER — ESTRADIOL 0.1 MG/GM VA CREA
TOPICAL_CREAM | VAGINAL | Status: DC | PRN
  Administered 2023-04-07: 1 via VAGINAL

## 2023-04-07 MED ORDER — DEXAMETHASONE SODIUM PHOSPHATE 10 MG/ML IJ SOLN
INTRAMUSCULAR | Status: AC
  Filled 2023-04-07: qty 1

## 2023-04-07 MED ORDER — PROPOFOL 10 MG/ML IV BOLUS
INTRAVENOUS | Status: AC
  Filled 2023-04-07: qty 20

## 2023-04-07 MED ORDER — LACTATED RINGERS IV SOLN: INTRAVENOUS | Status: DC

## 2023-04-07 MED ORDER — KETOROLAC TROMETHAMINE 30 MG/ML IJ SOLN
INTRAMUSCULAR | Status: DC | PRN
  Administered 2023-04-07: 30 mg via INTRAVENOUS

## 2023-04-07 MED ORDER — PROPOFOL 10 MG/ML IV BOLUS
INTRAVENOUS | Status: DC | PRN
  Administered 2023-04-07: 150 mg via INTRAVENOUS

## 2023-04-07 MED ORDER — MIDAZOLAM HCL 2 MG/2ML IJ SOLN
INTRAMUSCULAR | Status: AC
  Filled 2023-04-07: qty 2

## 2023-04-07 MED ORDER — ACETAMINOPHEN 325 MG PO TABS
325.0000 mg | ORAL_TABLET | Freq: Once | ORAL | Status: AC
  Administered 2023-04-07: 325 mg via ORAL

## 2023-04-07 MED ORDER — ACETAMINOPHEN 325 MG PO TABS
ORAL_TABLET | ORAL | Status: AC
  Filled 2023-04-07: qty 1

## 2023-04-07 MED ORDER — ACETAMINOPHEN 500 MG PO TABS
ORAL_TABLET | ORAL | Status: AC
  Filled 2023-04-07: qty 2

## 2023-04-07 MED ORDER — PHENYLEPHRINE 80 MCG/ML (10ML) SYRINGE FOR IV PUSH (FOR BLOOD PRESSURE SUPPORT)
PREFILLED_SYRINGE | INTRAVENOUS | Status: DC | PRN
Start: 2023-04-07 — End: 2023-04-07
  Administered 2023-04-07: 80 ug via INTRAVENOUS

## 2023-04-07 MED ORDER — LACTATED RINGERS IV SOLN
INTRAVENOUS | Status: DC
  Filled 2023-04-07 (×2): qty 250

## 2023-04-07 MED ORDER — SODIUM CHLORIDE 0.9 % IR SOLN
Status: DC | PRN
  Administered 2023-04-07: 1000 mL via INTRAVESICAL

## 2023-04-07 MED ORDER — KETOROLAC TROMETHAMINE 30 MG/ML IJ SOLN
INTRAMUSCULAR | Status: AC
  Filled 2023-04-07: qty 1

## 2023-04-07 MED ORDER — ONDANSETRON HCL 4 MG/2ML IJ SOLN
INTRAMUSCULAR | Status: AC
  Filled 2023-04-07: qty 2

## 2023-04-07 SURGICAL SUPPLY — 20 items
BNDG CMPR 75X21 PLY HI ABS (MISCELLANEOUS)
DILATOR CANAL MILEX (MISCELLANEOUS) IMPLANT
DRSG TELFA 3X8 NADH STRL (GAUZE/BANDAGES/DRESSINGS) ×1 IMPLANT
GAUZE 4X4 16PLY ~~LOC~~+RFID DBL (SPONGE) ×2 IMPLANT
GAUZE PAD ABD 8X10 STRL (GAUZE/BANDAGES/DRESSINGS) ×1 IMPLANT
GAUZE STRETCH 2X75IN STRL (MISCELLANEOUS) IMPLANT
GLOVE BIO SURGEON STRL SZ7.5 (GLOVE) ×2 IMPLANT
GOWN STRL REUS W/TWL LRG LVL3 (GOWN DISPOSABLE) ×1 IMPLANT
HOLDER FOLEY CATH W/STRAP (MISCELLANEOUS) ×1 IMPLANT
IV NS 1000ML (IV SOLUTION) ×1
IV NS 1000ML BAXH (IV SOLUTION) ×1 IMPLANT
IV SET ADMIN PUMP GEMINI W/NLD (IV SETS) ×1 IMPLANT
KIT TURNOVER CYSTO (KITS) ×1 IMPLANT
MAT PREVALON FULL STRYKER (MISCELLANEOUS) ×1 IMPLANT
PACK VAGINAL MINOR WOMEN LF (CUSTOM PROCEDURE TRAY) ×1 IMPLANT
PACKING VAGINAL (PACKING) IMPLANT
SLEEVE SCD COMPRESS KNEE MED (STOCKING) ×1 IMPLANT
TOWEL OR 17X24 6PK STRL BLUE (TOWEL DISPOSABLE) ×1 IMPLANT
TRAY FOLEY W/BAG SLVR 14FR LF (SET/KITS/TRAYS/PACK) ×1 IMPLANT
WATER STERILE IRR 500ML POUR (IV SOLUTION) ×1 IMPLANT

## 2023-04-07 NOTE — Anesthesia Procedure Notes (Signed)
Procedure Name: LMA Insertion Date/Time: 04/07/2023 7:47 AM  Performed by: Francie Massing, CRNAPre-anesthesia Checklist: Patient identified, Emergency Drugs available, Suction available and Patient being monitored Patient Re-evaluated:Patient Re-evaluated prior to induction Oxygen Delivery Method: Circle system utilized Preoxygenation: Pre-oxygenation with 100% oxygen Induction Type: IV induction Ventilation: Mask ventilation without difficulty LMA: LMA inserted LMA Size: 4.0 Number of attempts: 1 Airway Equipment and Method: Bite block Placement Confirmation: positive ETCO2 Tube secured with: Tape Dental Injury: Teeth and Oropharynx as per pre-operative assessment

## 2023-04-07 NOTE — Transfer of Care (Signed)
Immediate Anesthesia Transfer of Care Note  Patient: NASHIYAH HOFMEYER  Procedure(s) Performed: Procedure(s) (LRB): TANDEM RING INSERTION (N/A) OPERATIVE ULTRASOUND (N/A)  Patient Location: PACU  Anesthesia Type: General  Level of Consciousness: awake, oriented, sedated and patient cooperative  Airway & Oxygen Therapy: Patient Spontanous Breathing and Patient connected to face mask oxygen  Post-op Assessment: Report given to PACU RN and Post -op Vital signs reviewed and stable  Post vital signs: Reviewed and stable  Complications: No apparent anesthesia complications Last Vitals:  Vitals Value Taken Time  BP    Temp    Pulse 84 04/07/23 0827  Resp 18 04/07/23 0827  SpO2 100 % 04/07/23 0827  Vitals shown include unfiled device data.  Last Pain:  Vitals:   04/07/23 0634  TempSrc: Oral  PainSc: 0-No pain      Patients Stated Pain Goal: 6 (04/07/23 1610)  Complications: No notable events documented.

## 2023-04-07 NOTE — Anesthesia Postprocedure Evaluation (Signed)
Anesthesia Post Note  Patient: Lorraine Yates  Procedure(s) Performed: TANDEM RING INSERTION (Vagina ) OPERATIVE ULTRASOUND (Vagina )     Patient location during evaluation: PACU Anesthesia Type: General Level of consciousness: awake and alert Pain management: pain level controlled Vital Signs Assessment: post-procedure vital signs reviewed and stable Respiratory status: spontaneous breathing, nonlabored ventilation and respiratory function stable Cardiovascular status: blood pressure returned to baseline and stable Postop Assessment: no apparent nausea or vomiting Anesthetic complications: no  No notable events documented.  Last Vitals:  Vitals:   04/07/23 0845 04/07/23 0900  BP: 99/75 105/84  Pulse: 75 71  Resp: 12 13  Temp:  (!) 36.4 C  SpO2: 97% 99%    Last Pain:  Vitals:   04/07/23 0900  TempSrc:   PainSc: 4                  Tatum Massman,W. EDMOND

## 2023-04-07 NOTE — Op Note (Signed)
04/07/2023  8:42 AM  PATIENT:  Lorraine Yates  52 y.o. female  PRE-OPERATIVE DIAGNOSIS:  EXOCERVIX  POST-OPERATIVE DIAGNOSIS:  EXOCERVIX  PROCEDURE:  Procedure(s): TANDEM RING INSERTION (N/A) OPERATIVE ULTRASOUND (N/A)  SURGEON:  Surgeons and Role:    * Antony Blackbird, MD - Primary  PHYSICIAN ASSISTANT:   ASSISTANTS: none   ANESTHESIA:   general  EBL:  0 mL   BLOOD ADMINISTERED:none  DRAINS: Urinary Catheter (Foley)   LOCAL MEDICATIONS USED:  NONE  SPECIMEN:  No Specimen  DISPOSITION OF SPECIMEN:  N/A  COUNTS:  YES  TOURNIQUET:  * No tourniquets in log *  DICTATION: She was taken to the [facility] room [facility].  The patient was prepped and draped in the usual sterile fashion and placed in the dorsolithotomy position.  A Foley catheter was placed and backfilled with approximately 200 cc of sterile water for ultrasound imaging.  Timeout through the procedure, preoperative medications and allergies was performed.   The patient then proceeded to undergo exam under anesthesia.  Significant reduction of the cervical mass was noted.  The residual tumor was flush with the right vaginal fornix.  There was some thickening noted in the right parametrial area but no obvious parametrial involvement.  Erythema was noted to the anterior lip of the cervix some thickening noted.  Cervical mass on exam was approximately 3.0 x 3.0 cm.  The uterus was anteverted on trans abdominal ultrasound.  The uterus length was approximately 9 cm.  The cervical sleeve placed last week remained in good position so therefore dilation of the cervical os was not necessary.  She then had a 60 mm 45 degree tandem with cervical sleeve placed within the endometrial cavity and endocervical canal.  Trans abdominal ultrasound documented good placement of the treatment equipment.  A 45 degree ring with large shielding cap was placed and attached to the tandem.  The patient then had placement of a rectal paddle  posteriorly to limit high-dose radiation therapy to the rectal mucosa.  In addition vaginal packing was placed posterior to the rectal paddle to lower rectal point doses.  This packing was soaked in Estrace cream to aid in placement.  The patient tolerated the procedure well.  She was subsequently transported to recovery room in stable condition.  Later in the day the patient will be brought down to radiation oncology for planning and treatment for her fifth high-dose-rate treatment.  She is to receive high-dose-rate brachytherapy treatment using iridium 192.  Prescription will be 5.5 Gy to the high risk clinical target volume.  This high-dose-rate treatment will complete her definitive course of radiation therapy.   PLAN OF CARE:  Transfer to radiation oncology for planning and treatment  PATIENT DISPOSITION:  PACU - hemodynamically stable.   Delay start of Pharmacological VTE agent (>24hrs) due to surgical blood loss or risk of bleeding: not applicable

## 2023-04-07 NOTE — Interval H&P Note (Signed)
History and Physical Interval Note:  04/07/2023 7:06 AM  Lorraine Yates  has presented today for surgery, with the diagnosis of EXOCERVIX.  The various methods of treatment have been discussed with the patient and family. After consideration of risks, benefits and other options for treatment, the patient has consented to  Procedure(s): TANDEM RING INSERTION (N/A) OPERATIVE ULTRASOUND (N/A) as a surgical intervention.  The patient's history has been reviewed, patient examined, no change in status, stable for surgery.  I have reviewed the patient's chart and labs.  Questions were answered to the patient's satisfaction.     Antony Blackbird

## 2023-04-08 ENCOUNTER — Encounter (HOSPITAL_BASED_OUTPATIENT_CLINIC_OR_DEPARTMENT_OTHER): Payer: Self-pay | Admitting: Radiation Oncology

## 2023-04-08 NOTE — Radiation Completion Notes (Signed)
Patient Name: Lorraine Yates, Lorraine Yates MRN: [id number] Date of Birth: February 10, 1971 Referring Physician: Eugene Garnet, M.D. Date of Service: 2023-04-08 Radiation Oncologist: Arnette Schaumann, M.D. 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 Staging on 2022-12-02: Cervical cancer (HCC) T=cT1b3, N=cN1, M=cM0 Intent: Curative     ==========DELIVERED PLANS==========  First Treatment Date: 2023-01-20 - Last Treatment Date: 2023-04-07   Plan Name: Pelvis Site: Pelvis Technique: IMRT Mode: Photon Dose Per Fraction: 1.8 Gy Prescribed Dose (Delivered / Prescribed): 45 Gy / 45 Gy Prescribed Fxs (Delivered / Prescribed): 25 / 25   Plan Name: Pelvis_Bst Site: Pelvis Technique: 3D Mode: Photon Dose Per Fraction: 1.8 Gy Prescribed Dose (Delivered / Prescribed): 9 Gy / 9 Gy Prescribed Fxs (Delivered / Prescribed): 5 / 5   Plan Name: Cervix_HDR_F1 Site: Cervix Technique: HDR Ir-192 Mode: Brachytherapy Dose Per Fraction: 5.5 Gy Prescribed Dose (Delivered / Prescribed): 5.5 Gy / 5.5 Gy Prescribed Fxs (Delivered / Prescribed): 1 / 1   Plan Name: Cervix_HDR_F2 Site: Cervix Technique: HDR Ir-192 Mode: Brachytherapy Dose Per Fraction: 5.5 Gy Prescribed Dose (Delivered / Prescribed): 5.5 Gy / 5.5 Gy Prescribed Fxs (Delivered / Prescribed): 1 / 1   Plan Name: Cervix_HDR_F3 Site: Cervix Technique: HDR Ir-192 Mode: Brachytherapy Dose Per Fraction: 5.5 Gy Prescribed Dose (Delivered / Prescribed): 5.5 Gy / 5.5 Gy Prescribed Fxs (Delivered / Prescribed): 1 / 1   Plan Name: Cervix_HDR_F4 Site: Cervix Technique: HDR Ir-192 Mode: Brachytherapy Dose Per Fraction: 5.5 Gy Prescribed Dose (Delivered / Prescribed): 5.5 Gy / 5.5 Gy Prescribed Fxs (Delivered / Prescribed): 1 / 1   Plan Name: Cervix_HDR_F5 Site: Cervix Technique: HDR Ir-192 Mode: Brachytherapy Dose Per Fraction: 5.5  Gy Prescribed Dose (Delivered / Prescribed): 5.5 Gy / 5.5 Gy Prescribed Fxs (Delivered / Prescribed): 1 / 1     ==========ON TREATMENT VISIT DATES========== 2023-01-25, 2023-02-01, 2023-02-08, 2023-02-15, 2023-02-22, 2023-03-01, 2023-03-10, 2023-03-15, 2023-03-21, 2023-03-31, 2023-04-07     ==========UPCOMING VISITS==========       ==========APPENDIX - ON TREATMENT VISIT NOTES==========   See weekly On Treatment Notes in Epic for details.

## 2023-04-09 ENCOUNTER — Other Ambulatory Visit: Payer: Self-pay

## 2023-04-20 ENCOUNTER — Encounter: Payer: Self-pay | Admitting: Hematology and Oncology

## 2023-04-20 ENCOUNTER — Telehealth: Payer: Self-pay

## 2023-04-20 ENCOUNTER — Other Ambulatory Visit: Payer: Self-pay | Admitting: Hematology and Oncology

## 2023-04-20 ENCOUNTER — Other Ambulatory Visit (HOSPITAL_COMMUNITY): Payer: Self-pay

## 2023-04-20 MED ORDER — OXYCODONE HCL 5 MG PO TABS
5.0000 mg | ORAL_TABLET | ORAL | 0 refills | Status: DC | PRN
  Filled 2023-04-20: qty 60, 10d supply, fill #0

## 2023-04-20 NOTE — Telephone Encounter (Signed)
Called and given below message. She verbalized understanding and will call the office back for questions/concerns. 

## 2023-04-20 NOTE — Telephone Encounter (Signed)
Agree with above I refilled her oxycodone, she can take with tylenol

## 2023-04-20 NOTE — Telephone Encounter (Signed)
Called regarding mychart message."Having real bad cramps pain on my right side stomach and also vomiting for the last 2 days. Cramps hurt so bad that I have to bend over as I walk. Took nausea pills but what can I take for my cramps". Reminded her to call the office for symptoms and not send mychart message. She verbalized understanding.  Vomiting started 2 days ago. She has been taking compazine only, instructed to alternate zofran and compazine until nausea vomiting is better. Last vomited this am. She will push oral fluids today and is able to eat crackers. Offered appt for IV fluids today or tomorrow, she declined IV fluids.  She has been having abdominal cramping for 2 days. She took a laxative last night and had bm today. She will continue laxatives until constipation resolves. She is requesting Oxycodone Rx to [facility] to help with cramping. She has 2 tabs left.

## 2023-04-21 ENCOUNTER — Other Ambulatory Visit (HOSPITAL_COMMUNITY): Payer: Self-pay

## 2023-04-22 ENCOUNTER — Encounter: Payer: Self-pay | Admitting: Hematology and Oncology

## 2023-04-22 ENCOUNTER — Inpatient Hospital Stay: Payer: BC Managed Care – PPO | Admitting: Hematology and Oncology

## 2023-04-22 ENCOUNTER — Inpatient Hospital Stay: Payer: BC Managed Care – PPO

## 2023-04-22 ENCOUNTER — Telehealth: Payer: Self-pay

## 2023-04-22 ENCOUNTER — Inpatient Hospital Stay: Payer: BC Managed Care – PPO | Attending: Hematology and Oncology

## 2023-04-22 VITALS — BP 130/69 | HR 127 | Temp 100.2°F | Resp 18 | Ht 64.0 in | Wt 151.0 lb

## 2023-04-22 DIAGNOSIS — Z5112 Encounter for antineoplastic immunotherapy: Secondary | ICD-10-CM | POA: Insufficient documentation

## 2023-04-22 DIAGNOSIS — C531 Malignant neoplasm of exocervix: Secondary | ICD-10-CM

## 2023-04-22 DIAGNOSIS — R1011 Right upper quadrant pain: Secondary | ICD-10-CM

## 2023-04-22 DIAGNOSIS — C539 Malignant neoplasm of cervix uteri, unspecified: Secondary | ICD-10-CM | POA: Diagnosis present

## 2023-04-22 DIAGNOSIS — F17210 Nicotine dependence, cigarettes, uncomplicated: Secondary | ICD-10-CM | POA: Insufficient documentation

## 2023-04-22 DIAGNOSIS — E860 Dehydration: Secondary | ICD-10-CM | POA: Diagnosis not present

## 2023-04-22 DIAGNOSIS — Z79620 Long term (current) use of immunosuppressive biologic: Secondary | ICD-10-CM | POA: Diagnosis not present

## 2023-04-22 DIAGNOSIS — D508 Other iron deficiency anemias: Secondary | ICD-10-CM

## 2023-04-22 DIAGNOSIS — N939 Abnormal uterine and vaginal bleeding, unspecified: Secondary | ICD-10-CM

## 2023-04-22 LAB — CMP (CANCER CENTER ONLY)
ALT: 7 U/L (ref 0–44)
AST: 10 U/L — ABNORMAL LOW (ref 15–41)
Albumin: 3.4 g/dL — ABNORMAL LOW (ref 3.5–5.0)
Alkaline Phosphatase: 148 U/L — ABNORMAL HIGH (ref 38–126)
Anion gap: 7 (ref 5–15)
BUN: 26 mg/dL — ABNORMAL HIGH (ref 6–20)
CO2: 26 mmol/L (ref 22–32)
Calcium: 9.1 mg/dL (ref 8.9–10.3)
Chloride: 99 mmol/L (ref 98–111)
Creatinine: 1.24 mg/dL — ABNORMAL HIGH (ref 0.44–1.00)
GFR, Estimated: 53 mL/min — ABNORMAL LOW (ref >60)
Glucose, Bld: 109 mg/dL — ABNORMAL HIGH (ref 70–99)
Potassium: 3.6 mmol/L (ref 3.5–5.1)
Sodium: 132 mmol/L — ABNORMAL LOW (ref 135–145)
Total Bilirubin: 0.7 mg/dL (ref 0.3–1.2)
Total Protein: 6.8 g/dL (ref 6.5–8.1)

## 2023-04-22 LAB — CBC WITH DIFFERENTIAL (CANCER CENTER ONLY)
Abs Immature Granulocytes: 0.03 10*3/uL (ref 0.00–0.07)
Basophils Absolute: 0 10*3/uL (ref 0.0–0.1)
Basophils Relative: 0 %
Eosinophils Absolute: 0 10*3/uL (ref 0.0–0.5)
Eosinophils Relative: 0 %
HCT: 27.4 % — ABNORMAL LOW (ref 36.0–46.0)
Hemoglobin: 9.4 g/dL — ABNORMAL LOW (ref 12.0–15.0)
Immature Granulocytes: 0 %
Lymphocytes Relative: 4 %
Lymphs Abs: 0.3 10*3/uL — ABNORMAL LOW (ref 0.7–4.0)
MCH: 32.6 pg (ref 26.0–34.0)
MCHC: 34.3 g/dL (ref 30.0–36.0)
MCV: 95.1 fL (ref 80.0–100.0)
Monocytes Absolute: 0.8 10*3/uL (ref 0.1–1.0)
Monocytes Relative: 9 %
Neutro Abs: 6.9 10*3/uL (ref 1.7–7.7)
Neutrophils Relative %: 87 %
Platelet Count: 202 10*3/uL (ref 150–400)
RBC: 2.88 MIL/uL — ABNORMAL LOW (ref 3.87–5.11)
RDW: 18.8 % — ABNORMAL HIGH (ref 11.5–15.5)
WBC Count: 8 10*3/uL (ref 4.0–10.5)
nRBC: 0 % (ref 0.0–0.2)

## 2023-04-22 LAB — MAGNESIUM: Magnesium: 1.7 mg/dL (ref 1.7–2.4)

## 2023-04-22 LAB — PREGNANCY, URINE: Preg Test, Ur: NEGATIVE

## 2023-04-22 MED ORDER — HEPARIN SOD (PORK) LOCK FLUSH 100 UNIT/ML IV SOLN
500.0000 [IU] | Freq: Once | INTRAVENOUS | Status: AC | PRN
  Administered 2023-04-22: 500 [IU]

## 2023-04-22 MED ORDER — SODIUM CHLORIDE 0.9% FLUSH
10.0000 mL | Freq: Once | INTRAVENOUS | Status: AC
  Administered 2023-04-22: 10 mL

## 2023-04-22 MED ORDER — SODIUM CHLORIDE 0.9 % IV SOLN: Freq: Once | INTRAVENOUS | Status: AC

## 2023-04-22 MED ORDER — SODIUM CHLORIDE 0.9% FLUSH
10.0000 mL | INTRAVENOUS | Status: DC | PRN
  Administered 2023-04-22: 10 mL

## 2023-04-22 MED ORDER — SODIUM CHLORIDE 0.9 % IV SOLN
200.0000 mg | Freq: Once | INTRAVENOUS | Status: AC
  Administered 2023-04-22: 200 mg via INTRAVENOUS
  Filled 2023-04-22: qty 200

## 2023-04-22 NOTE — Telephone Encounter (Signed)
Called and given appt for ultrasound tomorrow 10/12 at [facility]. Arrive at 0845 for 9 am appt to [facility] to register. She verbalized understanding

## 2023-04-22 NOTE — Patient Instructions (Signed)

## 2023-04-22 NOTE — Progress Notes (Signed)
Per Dr Bertis Ruddy, ok to treat with HR 120

## 2023-04-22 NOTE — Progress Notes (Signed)
[facility] OFFICE PROGRESS NOTE  Patient Care Team: Patient, No Pcp Per as PCP - General (General Practice) Artis Delay, MD as Consulting Physician (Hematology and Oncology)  HISTORY OF PRESENTING ILLNESS: Discussed the use of AI scribe software for clinical note transcription with the patient, who gave verbal consent to proceed.  History of Present Illness   The patient, with a history of cervical cancer, presented with severe abdominal cramps and vomiting. The cramps, located on the right side of the belly button, were severe enough to consider hospitalization. The patient reported that passing gas alleviated the discomfort, suggesting bloating. The patient denied any previous similar episodes.  The patient also reported a fever, raising concerns about a possible gallbladder attack. However, the patient's gallbladder was still in place. The patient reported feeling dehydrated despite drinking Gatorade and water, suggesting inadequate fluid intake. The patient rated the current pain as a 4 on a scale of 1 to 10, with 10 being the worst pain.  The patient reported taking two doses of oxycodone in the morning and two at night for pain management, which allowed for uninterrupted sleep. The patient also reported a change in bowel movements, with stools becoming runny after taking a laxative.  The patient's diet included fatty foods such as [place] fries, hash browns, and steak, which could potentially trigger a gallbladder attack. The patient reported severe pain after consuming ice cream, a high-fat food.  The patient also reported occasional smoking, with a maximum of three cigarettes on a bad day. The patient expressed interest in using a nicotine patch to quit smoking.  The patient was scheduled for immunotherapy treatment and expressed a desire to continue with the treatment despite the current symptoms.         Assessment and Plan    Cervical cancer Continue  immunotherapy today   Abdominal Pain New onset right upper quadrant pain with nausea and vomiting. Pain improves with passing gas. No fever. Physical exam consistent with possible gallbladder pathology. No prior history of similar symptoms. No rebound or guarding suggestive of acute cholecystitis -Order urgent ultrasound to evaluate gallbladder. -Advise patient to follow a low-fat diet and hydrate adequately. -Advise patient to seek emergency care if symptoms worsen.  Dehydration Likely secondary to recent vomiting and inadequate fluid intake. -Encourage increased fluid intake, aiming for more than 64 ounces per day.  Tobacco Use Patient reports sporadic smoking, up to 3 cigarettes on a bad day. -Encourage cessation and suggest replacing the habit with non-harmful alternatives such as chewing gum or sucking on candy.  Pain Management Patient reports adequate pain control with Oxycodone 2 tablets as needed. -Continue current regimen.  Follow-up Pending gallbladder ultrasound results. -Plan to review results and determine appropriate follow-up.          Orders Placed This Encounter  Procedures   US Abdomen Limited RUQ (LIVER/GB)    Standing Status:   Future    Standing Expiration Date:   04/21/2024    Order Specific Question:   Reason for Exam (SYMPTOM  OR DIAGNOSIS REQUIRED)    Answer:   severe RUQ pain    Order Specific Question:   Preferred imaging location?    Answer:   [facility]    All questions were answered. The patient knows to call the clinic with any problems, questions or concerns. The total time spent in the appointment was 40 minutes encounter with patients including review of chart and various tests results, discussions about plan of care and  coordination of care plan   Artis Delay, MD 04/22/2023 12:30 PM  REVIEW OF SYSTEMS:  All other systems were reviewed with the patient and are negative.  I have reviewed the past medical history, past surgical  history, social history and family history with the patient and they are unchanged from previous note.  ALLERGIES:  is allergic to demerol [meperidine hcl] and hydromorphone.  MEDICATIONS:  Current Outpatient Medications  Medication Sig Dispense Refill   ondansetron (ZOFRAN) 8 MG tablet Take 8 mg by mouth every 8 (eight) hours as needed for nausea or vomiting.     lidocaine-prilocaine (EMLA) cream Apply 1 Application topically as needed. Apply to port site 1 hour to 30 minutes before port is accessed 30 g 1   oxyCODONE (OXY IR/ROXICODONE) 5 MG immediate release tablet Take 1 tablet (5 mg) by mouth every 4 hours as needed for severe pain. 60 tablet 0   prochlorperazine (COMPAZINE) 10 MG tablet TAKE 1 TABLET(10 MG) BY MOUTH EVERY 6 HOURS AS NEEDED FOR NAUSEA OR VOMITING 30 tablet 1   No current facility-administered medications for this visit.   Facility-Administered Medications Ordered in Other Visits  Medication Dose Route Frequency Provider Last Rate Last Admin   heparin lock flush 100 unit/mL  500 Units Intracatheter Once PRN Bertis Ruddy, Jaben Benegas, MD       pembrolizumab (KEYTRUDA) 200 mg in sodium chloride 0.9 % 50 mL chemo infusion  200 mg Intravenous Once Daryn Hicks, MD       sodium chloride flush (NS) 0.9 % injection 10 mL  10 mL Intracatheter PRN Artis Delay, MD        SUMMARY OF ONCOLOGIC HISTORY: Oncology History Overview Note  PD-L1 CPS 1%   Cervical cancer (HCC)  11/24/2022 Initial Diagnosis   The patient presented recently to the emergency department with both vaginal bleeding and rectal bleeding.  Exam in the emergency department showed a 6 cm irregular cervical mass.     11/24/2022 Imaging   CT pelvis 1. Heterogeneous cervical mass is highly worrisome for cervical carcinoma. Significant border of contact with the adjacent rectum. Difficult to exclude invasion. 2. Locule of air within the cervical mass may be due to necrosis or extension from the vagina. Difficult to exclude a  fistula to the rectum.   11/24/2022 Imaging   US pelvis 1. Large, approximately 7.6 cm vascular soft tissue mass of the lower uterine segment. This seems inseparable from the cervix and Cervical Carcinoma is not excluded. Alternatively this might be a large lower uterine fibroid. Recommend direct visualization of the cervix.   2. Elsewhere the uterus and endometrium are within normal limits. Normal ovaries and no free fluid.   11/26/2022 Pathology Results   SURGICAL PATHOLOGY  CASE: [id number]  PATIENT: Lorraine Yates  Surgical Pathology Report   FINAL MICROSCOPIC DIAGNOSIS:   A. 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  See comment.   COMMENT:   - Morphologic evaluation and p16 immunohistochemical stain reveal superficial squamous mucosa with findings consistent with high-grade squamous intraepithelial lesion.  The patient's clinical history of a cervical mass is noted, and a deeper more invasive process cannot be excluded.  Clinical and imaging correlation is recommended.      11/29/2022 Initial Diagnosis   Cervical cancer (HCC)   12/02/2022 Cancer Staging   Staging form: Cervix Uteri, AJCC Version 9 - Clinical stage from 12/02/2022: Stage IIIC1 (cT1b3, cN1, cM0) - Signed by Artis Delay, MD on 12/15/2022 Stage prefix: Initial diagnosis  12/15/2022 PET scan   1. Hypermetabolic cervical mass consistent with known cervical carcinoma. 2. There is a mildly FDG avid left pelvic sidewall lymph node which is equivocal for nodal metastasis. 3. No additional sites of disease identified. 4. Aortic Atherosclerosis (ICD10-I70.0).     12/16/2022 Procedure   Successful placement of a right internal jugular approach power injectable Port-A-Cath. The catheter is ready for immediate use.     12/17/2022 Pathology Results   SURGICAL PATHOLOGY  CASE: [id number]  PATIENT: Lorraine Yates  Surgical Pathology Report   Clinical History: malignant neoplasm of cervix,  unspecified site   FINAL MICROSCOPIC DIAGNOSIS:   A. 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     12/18/2022 Imaging   1. The normal cervical anatomy is completely effaced by a bulky mass measuring 7.9 x 6.9 x 5.5 cm. Mass extends into the lower uterine segment with loss of parametrial definition about the posterior 180 degrees of the upper portion of the mass. Mass remains confined to the upper third of the vagina. 2. Preserved fat planes to the adjacent bladder and rectum. No ureteral invasion or hydronephrosis. 3. Small left pelvic sidewall lymph node measuring 0.9 x 0.6 cm, previously with equivocal FDG avidity and suspicious for a small nodal metastasis. No other enlarged lymph nodes in the pelvis.     01/20/2023 - 01/20/2023 Chemotherapy   Patient is on Treatment Plan : HEAD/NECK Cisplatin (40) q7d     01/20/2023 -  Chemotherapy   Patient is on Treatment Plan : Cervical Pembrolizumab (200) q21d, cisplatin (40) q7d + XRT / pembrolizumab (400) q42d        PHYSICAL EXAMINATION: ECOG PERFORMANCE STATUS: 1 - Symptomatic but completely ambulatory  Vitals:   04/22/23 1128  BP: 130/69  Pulse: (!) 127  Resp: 18  Temp: 100.2 F (37.9 C)  SpO2: 99%   Filed Weights   04/22/23 1128  Weight: 151 lb (68.5 kg)    GENERAL:alert, no distress and comfortable Noted mild right upper quadrant tenderness on palpation without rebound or guarding.  LABORATORY DATA:  I have reviewed the data as listed    Component Value Date/Time   NA 132 (L) 04/22/2023 1102   K 3.6 04/22/2023 1102   CL 99 04/22/2023 1102   CO2 26 04/22/2023 1102   GLUCOSE 109 (H) 04/22/2023 1102   BUN 26 (H) 04/22/2023 1102   CREATININE 1.24 (H) 04/22/2023 1102   CALCIUM 9.1 04/22/2023 1102   PROT 6.8 04/22/2023 1102   ALBUMIN 3.4 (L) 04/22/2023 1102   AST 10 (L) 04/22/2023  1102   ALT 7 04/22/2023 1102   ALKPHOS 148 (H) 04/22/2023 1102   BILITOT 0.7 04/22/2023 1102   GFRNONAA 53 (L) 04/22/2023 1102    No results found for: "SPEP", "UPEP"  Lab Results  Component Value Date   WBC 8.0 04/22/2023   NEUTROABS 6.9 04/22/2023   HGB 9.4 (L) 04/22/2023   HCT 27.4 (L) 04/22/2023   MCV 95.1 04/22/2023   PLT 202 04/22/2023      Chemistry      Component Value Date/Time   NA 132 (L) 04/22/2023 1102   K 3.6 04/22/2023 1102   CL 99 04/22/2023 1102   CO2 26 04/22/2023 1102   BUN 26 (H) 04/22/2023 1102   CREATININE 1.24 (H) 04/22/2023 1102      Component Value Date/Time   CALCIUM 9.1  04/22/2023 1102   ALKPHOS 148 (H) 04/22/2023 1102   AST 10 (L) 04/22/2023 1102   ALT 7 04/22/2023 1102   BILITOT 0.7 04/22/2023 1102       RADIOGRAPHIC STUDIES: I have personally reviewed the radiological images as listed and agreed with the findings in the report. [place] Intraoperative  Result Date: 04/07/2023 CLINICAL DATA:  Ultrasound was provided for use by the ordering physician.  No provider Interpretation or professional fees incurred.    [place] Intraoperative  Result Date: 03/31/2023 CLINICAL DATA:  Ultrasound was provided for use by the ordering physician.  No provider Interpretation or professional fees incurred.

## 2023-04-23 ENCOUNTER — Other Ambulatory Visit: Payer: Self-pay

## 2023-04-23 ENCOUNTER — Ambulatory Visit (HOSPITAL_COMMUNITY)
Admission: RE | Admit: 2023-04-23 | Discharge: 2023-04-23 | Disposition: A | Discharge status: Home / Self Care | Payer: BC Managed Care – PPO | Source: Ambulatory Visit | Attending: Hematology and Oncology | Admitting: Hematology and Oncology

## 2023-04-23 DIAGNOSIS — R1011 Right upper quadrant pain: Secondary | ICD-10-CM | POA: Insufficient documentation

## 2023-04-23 DIAGNOSIS — K750 Abscess of liver: Secondary | ICD-10-CM | POA: Diagnosis not present

## 2023-04-23 DIAGNOSIS — K35211 Acute appendicitis with generalized peritonitis, with perforation and abscess: Secondary | ICD-10-CM | POA: Diagnosis not present

## 2023-04-25 ENCOUNTER — Other Ambulatory Visit: Payer: Self-pay | Admitting: Hematology and Oncology

## 2023-04-25 ENCOUNTER — Telehealth: Payer: Self-pay

## 2023-04-25 DIAGNOSIS — C531 Malignant neoplasm of exocervix: Secondary | ICD-10-CM

## 2023-04-25 DIAGNOSIS — R1011 Right upper quadrant pain: Secondary | ICD-10-CM

## 2023-04-25 NOTE — Telephone Encounter (Signed)
Called and given below message. Given radiology scheduling #, she will call to scheduled CT.

## 2023-04-25 NOTE — Telephone Encounter (Signed)
-----   Message from Artis Delay sent at 04/25/2023  9:00 AM EDT ----- P[ls call her, Lorraine Yates did not show cholecystitis Is she better or still feel uncomfortable? If better, nothing to do except modified diet as discussed If not better, I will order CT Let me know

## 2023-04-25 NOTE — Telephone Encounter (Signed)
Called and given below message. She verbalized understanding. She is still feeling uncomfortable and still having pain. She is complaining that she has not had a bm since Friday am. She is taking a laxative and not sure of the name. Instructed to take Miralax daily and senokot-s 2 tabs TID. She verbalized understanding and would like to get the CT.

## 2023-04-25 NOTE — Telephone Encounter (Signed)
I placed order for CT abdomen and pelvis for 1 week Please give her number to call to schedule

## 2023-04-26 ENCOUNTER — Encounter (HOSPITAL_COMMUNITY): Payer: Self-pay

## 2023-04-26 ENCOUNTER — Encounter: Payer: Self-pay | Admitting: Hematology and Oncology

## 2023-04-26 ENCOUNTER — Emergency Department (HOSPITAL_COMMUNITY): Payer: BC Managed Care – PPO

## 2023-04-26 ENCOUNTER — Other Ambulatory Visit: Payer: Self-pay

## 2023-04-26 ENCOUNTER — Telehealth: Payer: Self-pay

## 2023-04-26 ENCOUNTER — Inpatient Hospital Stay (HOSPITAL_COMMUNITY)
Admission: EM | Admit: 2023-04-26 | Discharge: 2023-04-29 | DRG: 371 | Disposition: A | Discharge status: Home / Self Care | Payer: BC Managed Care – PPO | Attending: Internal Medicine | Admitting: Internal Medicine

## 2023-04-26 DIAGNOSIS — K5900 Constipation, unspecified: Secondary | ICD-10-CM | POA: Diagnosis present

## 2023-04-26 DIAGNOSIS — Z8042 Family history of malignant neoplasm of prostate: Secondary | ICD-10-CM | POA: Diagnosis not present

## 2023-04-26 DIAGNOSIS — Z923 Personal history of irradiation: Secondary | ICD-10-CM

## 2023-04-26 DIAGNOSIS — C538 Malignant neoplasm of overlapping sites of cervix uteri: Secondary | ICD-10-CM

## 2023-04-26 DIAGNOSIS — Z820 Family history of epilepsy and other diseases of the nervous system: Secondary | ICD-10-CM | POA: Diagnosis not present

## 2023-04-26 DIAGNOSIS — F17210 Nicotine dependence, cigarettes, uncomplicated: Secondary | ICD-10-CM | POA: Diagnosis present

## 2023-04-26 DIAGNOSIS — K750 Abscess of liver: Principal | ICD-10-CM

## 2023-04-26 DIAGNOSIS — Z720 Tobacco use: Secondary | ICD-10-CM | POA: Diagnosis present

## 2023-04-26 DIAGNOSIS — E871 Hypo-osmolality and hyponatremia: Secondary | ICD-10-CM | POA: Diagnosis present

## 2023-04-26 DIAGNOSIS — C539 Malignant neoplasm of cervix uteri, unspecified: Secondary | ICD-10-CM | POA: Diagnosis present

## 2023-04-26 DIAGNOSIS — C531 Malignant neoplasm of exocervix: Secondary | ICD-10-CM

## 2023-04-26 DIAGNOSIS — D630 Anemia in neoplastic disease: Secondary | ICD-10-CM | POA: Diagnosis present

## 2023-04-26 DIAGNOSIS — E878 Other disorders of electrolyte and fluid balance, not elsewhere classified: Secondary | ICD-10-CM | POA: Diagnosis present

## 2023-04-26 DIAGNOSIS — K824 Cholesterolosis of gallbladder: Secondary | ICD-10-CM | POA: Diagnosis present

## 2023-04-26 DIAGNOSIS — Z885 Allergy status to narcotic agent status: Secondary | ICD-10-CM | POA: Diagnosis not present

## 2023-04-26 DIAGNOSIS — Z95828 Presence of other vascular implants and grafts: Secondary | ICD-10-CM

## 2023-04-26 DIAGNOSIS — K838 Other specified diseases of biliary tract: Secondary | ICD-10-CM | POA: Diagnosis present

## 2023-04-26 DIAGNOSIS — D6489 Other specified anemias: Secondary | ICD-10-CM | POA: Diagnosis present

## 2023-04-26 DIAGNOSIS — K35211 Acute appendicitis with generalized peritonitis, with perforation and abscess: Principal | ICD-10-CM

## 2023-04-26 DIAGNOSIS — K3532 Acute appendicitis with perforation and localized peritonitis, without abscess: Secondary | ICD-10-CM | POA: Diagnosis not present

## 2023-04-26 LAB — COMPREHENSIVE METABOLIC PANEL
ALT: 13 U/L (ref 0–44)
AST: 15 U/L (ref 15–41)
Albumin: 3 g/dL — ABNORMAL LOW (ref 3.5–5.0)
Alkaline Phosphatase: 221 U/L — ABNORMAL HIGH (ref 38–126)
Anion gap: 11 (ref 5–15)
BUN: 11 mg/dL (ref 6–20)
CO2: 25 mmol/L (ref 22–32)
Calcium: 8.8 mg/dL — ABNORMAL LOW (ref 8.9–10.3)
Chloride: 97 mmol/L — ABNORMAL LOW (ref 98–111)
Creatinine, Ser: 0.89 mg/dL (ref 0.44–1.00)
GFR, Estimated: >60 mL/min (ref >60)
Glucose, Bld: 103 mg/dL — ABNORMAL HIGH (ref 70–99)
Potassium: 3.6 mmol/L (ref 3.5–5.1)
Sodium: 133 mmol/L — ABNORMAL LOW (ref 135–145)
Total Bilirubin: 0.9 mg/dL (ref 0.3–1.2)
Total Protein: 7.6 g/dL (ref 6.5–8.1)

## 2023-04-26 LAB — CBC WITH DIFFERENTIAL/PLATELET
Abs Immature Granulocytes: 0.03 10*3/uL (ref 0.00–0.07)
Basophils Absolute: 0 10*3/uL (ref 0.0–0.1)
Basophils Relative: 0 %
Eosinophils Absolute: 0.1 10*3/uL (ref 0.0–0.5)
Eosinophils Relative: 1 %
HCT: 30.4 % — ABNORMAL LOW (ref 36.0–46.0)
Hemoglobin: 9.9 g/dL — ABNORMAL LOW (ref 12.0–15.0)
Immature Granulocytes: 0 %
Lymphocytes Relative: 7 %
Lymphs Abs: 0.5 10*3/uL — ABNORMAL LOW (ref 0.7–4.0)
MCH: 31.9 pg (ref 26.0–34.0)
MCHC: 32.6 g/dL (ref 30.0–36.0)
MCV: 98.1 fL (ref 80.0–100.0)
Monocytes Absolute: 0.6 10*3/uL (ref 0.1–1.0)
Monocytes Relative: 8 %
Neutro Abs: 6.1 10*3/uL (ref 1.7–7.7)
Neutrophils Relative %: 84 %
Platelets: 315 10*3/uL (ref 150–400)
RBC: 3.1 MIL/uL — ABNORMAL LOW (ref 3.87–5.11)
RDW: 18.6 % — ABNORMAL HIGH (ref 11.5–15.5)
WBC: 7.3 10*3/uL (ref 4.0–10.5)
nRBC: 0 % (ref 0.0–0.2)

## 2023-04-26 MED ORDER — PIPERACILLIN-TAZOBACTAM 3.375 G IVPB
3.3750 g | Freq: Three times a day (TID) | INTRAVENOUS | Status: DC
  Administered 2023-04-26 – 2023-04-29 (×9): 3.375 g via INTRAVENOUS
  Filled 2023-04-26 (×9): qty 50

## 2023-04-26 MED ORDER — IOHEXOL 300 MG/ML  SOLN
100.0000 mL | Freq: Once | INTRAMUSCULAR | Status: AC | PRN
  Administered 2023-04-26: 100 mL via INTRAVENOUS

## 2023-04-26 MED ORDER — ACETAMINOPHEN 650 MG RE SUPP: 650.0000 mg | Freq: Four times a day (QID) | RECTAL | Status: DC | PRN

## 2023-04-26 MED ORDER — MORPHINE SULFATE (PF) 4 MG/ML IV SOLN
4.0000 mg | Freq: Once | INTRAVENOUS | Status: AC
  Administered 2023-04-26: 4 mg via INTRAVENOUS
  Filled 2023-04-26: qty 1

## 2023-04-26 MED ORDER — LACTATED RINGERS IV SOLN: INTRAVENOUS | Status: AC

## 2023-04-26 MED ORDER — OXYCODONE HCL 5 MG PO TABS
5.0000 mg | ORAL_TABLET | ORAL | Status: DC | PRN
  Administered 2023-04-26 – 2023-04-28 (×2): 5 mg via ORAL
  Filled 2023-04-26 (×2): qty 1

## 2023-04-26 MED ORDER — ONDANSETRON HCL 4 MG/2ML IJ SOLN: 4.0000 mg | Freq: Four times a day (QID) | INTRAMUSCULAR | Status: DC | PRN

## 2023-04-26 MED ORDER — ONDANSETRON HCL 4 MG PO TABS: 4.0000 mg | ORAL_TABLET | Freq: Four times a day (QID) | ORAL | Status: DC | PRN

## 2023-04-26 MED ORDER — MORPHINE SULFATE (PF) 2 MG/ML IV SOLN
2.0000 mg | INTRAVENOUS | Status: DC | PRN
  Administered 2023-04-26 – 2023-04-27 (×6): 2 mg via INTRAVENOUS
  Filled 2023-04-26 (×2): qty 1
  Filled 2023-04-26: qty 2
  Filled 2023-04-26 (×5): qty 1

## 2023-04-26 MED ORDER — PIPERACILLIN-TAZOBACTAM 3.375 G IVPB 30 MIN
3.3750 g | Freq: Once | INTRAVENOUS | Status: AC
  Administered 2023-04-26: 3.375 g via INTRAVENOUS
  Filled 2023-04-26: qty 50

## 2023-04-26 MED ORDER — ACETAMINOPHEN 325 MG PO TABS: 650.0000 mg | ORAL_TABLET | Freq: Four times a day (QID) | ORAL | Status: DC | PRN

## 2023-04-26 MED ORDER — SODIUM CHLORIDE 0.9% FLUSH
3.0000 mL | Freq: Two times a day (BID) | INTRAVENOUS | Status: DC
  Administered 2023-04-26 – 2023-04-29 (×6): 3 mL via INTRAVENOUS

## 2023-04-26 NOTE — ED Notes (Signed)
ED TO INPATIENT HANDOFF REPORT  Name/Age/Gender Lorraine Yates 52 y.o. female  Code Status    Code Status Orders  (From admission, onward)           Start     Ordered   04/26/23 1855  Full code  Continuous       Question:  By:  Answer:  Consent: discussion documented in EHR   04/26/23 1856           Code Status History     Date Active Date Inactive Code Status Order ID Comments User Context   04/07/2023 0621 04/07/2023 1415 Full Code [id number]  Antony Blackbird, MD Inpatient   03/31/2023 0609 03/31/2023 1431 Full Code [id number]  Antony Blackbird, MD Inpatient   03/21/2023 0541 03/21/2023 1403 Full Code [id number]  Antony Blackbird, MD Inpatient   03/15/2023 0614 03/17/2023 0510 Full Code [id number]  Antony Blackbird, MD Inpatient   03/10/2023 0827 03/10/2023 1817 Full Code [id number]  Antony Blackbird, MD Inpatient   12/16/2022 1209 12/17/2022 0508 Full Code [id number]  Simonne Come, MD HOV       Home/SNF/Other Home  Chief Complaint Perforated appendicitis [K35.32]  Level of Care/Admitting Diagnosis ED Disposition     ED Disposition  Admit   Condition  --   Comment  Hospital Area: [facility] [[id number]]  Level of Care: Telemetry [5]  Admit to tele based on following criteria: Monitor QTC interval  May admit patient to [facility] or [facility] if equivalent level of care is available:: No  Covid Evaluation: Asymptomatic - no recent exposure (last 10 days) testing not required  Diagnosis: Perforated appendicitis [259563]  Admitting Physician: Briscoe Deutscher [[id number]]  Attending Physician: Briscoe Deutscher [[id number]]  Certification:: I certify this patient will need inpatient services for at least 2 midnights  Expected Medical Readiness: 04/29/2023          Medical History Past Medical History:  Diagnosis Date   Acquired pancytopenia (HCC)    Anemia    History of cancer chemotherapy    cervical cancer  01-20-2023  to 02-28-2023   Hypomagnesemia    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   oncologist--- dr gorsuch/  radiation oncologist--- dr Roselind Messier;  dx 05/ 2024;   chemo 01-20-2023 to 02-28-2023;   IMRT 01-20-2023 to 03-03-2023  to start high dose radiation 03-10-2023 w/ tandem   Port-A-Cath in place 12/16/2022    Allergies Allergies  Allergen Reactions   Demerol [Meperidine Hcl] Rash    Redness and itching to IV insertion site    Hydromorphone Rash    Redness and itching to IV insertion site that extended up left arm 03/10/23    IV Location/Drains/Wounds Patient Lines/Drains/Airways Status     Active Line/Drains/Airways     Name Placement date Placement time Site Days   Implanted Port 12/16/22 Right Chest 12/16/22  1150  Chest  131   Peripheral IV 04/26/23 20 G 1" Right Antecubital 04/26/23  1421  Antecubital  less than 1            Labs/Imaging Results for orders placed or performed during the hospital encounter of 04/26/23 (from the past 48 hour(s))  Comprehensive metabolic panel     Status: Abnormal   Collection Time: 04/26/23  2:19 PM  Result Value Ref Range   Sodium 133 (L) 135 - 145 mmol/L   Potassium 3.6 3.5 - 5.1 mmol/L   Chloride 97 (L) 98 - 111 mmol/L   CO2 25 22 -  32 mmol/L   Glucose, Bld 103 (H) 70 - 99 mg/dL    Comment: Glucose reference range applies only to samples taken after fasting for at least 8 hours.   BUN 11 6 - 20 mg/dL   Creatinine, Ser 4.09 0.44 - 1.00 mg/dL   Calcium 8.8 (L) 8.9 - 10.3 mg/dL   Total Protein 7.6 6.5 - 8.1 g/dL   Albumin 3.0 (L) 3.5 - 5.0 g/dL   AST 15 15 - 41 U/L   ALT 13 0 - 44 U/L   Alkaline Phosphatase 221 (H) 38 - 126 U/L   Total Bilirubin 0.9 0.3 - 1.2 mg/dL   GFR, Estimated >81 >19 mL/min    Comment: (NOTE) Calculated using the CKD-EPI Creatinine Equation (2021)    Anion gap 11 5 - 15    Comment: Performed at [facility], [address], [place], [place] [address]  CBC with Differential     Status: Abnormal   Collection Time: 04/26/23  2:19  PM  Result Value Ref Range   WBC 7.3 4.0 - 10.5 K/uL   RBC 3.10 (L) 3.87 - 5.11 MIL/uL   Hemoglobin 9.9 (L) 12.0 - 15.0 g/dL   HCT 95.6 (L) 21.3 - 08.6 %   MCV 98.1 80.0 - 100.0 fL   MCH 31.9 26.0 - 34.0 pg   MCHC 32.6 30.0 - 36.0 g/dL   RDW 57.8 (H) 46.9 - 62.9 %   Platelets 315 150 - 400 K/uL   nRBC 0.0 0.0 - 0.2 %   Neutrophils Relative % 84 %   Neutro Abs 6.1 1.7 - 7.7 K/uL   Lymphocytes Relative 7 %   Lymphs Abs 0.5 (L) 0.7 - 4.0 K/uL   Monocytes Relative 8 %   Monocytes Absolute 0.6 0.1 - 1.0 K/uL   Eosinophils Relative 1 %   Eosinophils Absolute 0.1 0.0 - 0.5 K/uL   Basophils Relative 0 %   Basophils Absolute 0.0 0.0 - 0.1 K/uL   Immature Granulocytes 0 %   Abs Immature Granulocytes 0.03 0.00 - 0.07 K/uL    Comment: Performed at [facility], [address], [place], [place] [address]   CT ABDOMEN PELVIS W CONTRAST  Result Date: 04/26/2023 CLINICAL DATA:  Abdominal pain, acute, nonlocalized. Right flank pain over the last week. EXAM: CT ABDOMEN AND PELVIS WITH CONTRAST TECHNIQUE: Multidetector CT imaging of the abdomen and pelvis was performed using the standard protocol following bolus administration of intravenous contrast. RADIATION DOSE REDUCTION: This exam was performed according to the departmental dose-optimization program which includes automated exposure control, adjustment of the mA and/or kV according to patient size and/or use of iterative reconstruction technique. CONTRAST:  OMNIPAQUE IOHEXOL 300 MG/ML  SOLN COMPARISON:  Ultrasound 2 days ago. FINDINGS: Lower chest: Mild linear scar or atelectasis at the right lung base. No pleural fluid. Hepatobiliary: No calcified gallstones or CT evidence of cholecystitis. Hepatomegaly. Liver abscess at the caudal tip of the right lobe with an air-fluid level measuring up to 5 cm in diameter. Adjacent appendicitis, presumably the etiology. See below. Pancreas: Normal Spleen: Normal Adrenals/Urinary Tract:  Adrenal glands are normal. Kidneys are normal. Bladder is normal. Stomach/Bowel: Stomach and small intestine are normal. As noted above, there is acute appendicitis. The appendix is located in a retrocecal location, extending back up to the caudal tip of the liver. The appendix is perforated in this location and there is some phlegmonous inflammation, possibly with a small developing abscess measuring about 12  mm. This is adjacent and in contact with the liver edge and quite likely the etiology of the liver abscess. Vascular/Lymphatic: Aorta and IVC are normal.  No adenopathy. Reproductive: No pelvic organ pathology. Other: No free fluid or air. Musculoskeletal: Normal IMPRESSION: Acute appendicitis. The appendix is located in a retrocecal location, extending back up to the caudal tip of the liver. The appendix is perforated in this location and there is some phlegmonous inflammation, possibly with a small developing abscess measuring about 12 mm. This is adjacent and in contact with the liver edge. There is a liver abscess at the caudal tip of the right lobe measuring up to 5 cm in size with a gas fluid level. The appendicitis is quite likely the etiology of the liver abscess. Electronically Signed   By: Paulina Fusi M.D.   On: 04/26/2023 17:41    Pending Labs Unresulted Labs (From admission, onward)     Start     Ordered   04/27/23 0500  Comprehensive metabolic panel  Daily,   R      04/26/23 1856   04/27/23 0500  Magnesium  Tomorrow morning,   R        04/26/23 1856   04/27/23 0500  CBC  Daily,   R      04/26/23 1856   04/26/23 1353  Urinalysis, w/ Reflex to Culture (Infection Suspected) -Urine, Clean Catch  Once,   URGENT       Question:  Specimen Source  Answer:  Urine, Clean Catch   04/26/23 1353            Vitals/Pain Today's Vitals   04/26/23 1652 04/26/23 1757 04/26/23 1806 04/26/23 1902  BP:  117/75 (!) 141/91   Pulse:  95 95   Resp:  18 18   Temp:   98.6 F (37 C)   TempSrc:    Oral   SpO2:   99%   PainSc: 5    5     Isolation Precautions No active isolations  Medications Medications  sodium chloride flush (NS) 0.9 % injection 3 mL (has no administration in time range)  lactated ringers infusion (has no administration in time range)  acetaminophen (TYLENOL) tablet 650 mg (has no administration in time range)    Or  acetaminophen (TYLENOL) suppository 650 mg (has no administration in time range)  oxyCODONE (Oxy IR/ROXICODONE) immediate release tablet 5 mg (has no administration in time range)  morphine (PF) 2 MG/ML injection 2-4 mg (has no administration in time range)  ondansetron (ZOFRAN) tablet 4 mg (has no administration in time range)    Or  ondansetron (ZOFRAN) injection 4 mg (has no administration in time range)  iohexol (OMNIPAQUE) 300 MG/ML solution 100 mL (100 mLs Intravenous Contrast Given 04/26/23 1456)  piperacillin-tazobactam (ZOSYN) IVPB 3.375 g (0 g Intravenous Stopped 04/26/23 1633)  morphine (PF) 4 MG/ML injection 4 mg (4 mg Intravenous Given 04/26/23 1607)  morphine (PF) 4 MG/ML injection 4 mg (4 mg Intravenous Given 04/26/23 1819)    Mobility walks

## 2023-04-26 NOTE — Progress Notes (Signed)
Pharmacy Antibiotic Note  Lillyanne SHADAI MCCLANE is a 52 y.o. female admitted on 04/26/2023 with  IAI .  Pharmacy has been consulted for Zosyn dosing.  Plan: Zosyn 3.375g IV q8h (4 hour infusion).     Temp (24hrs), Avg:99.2 F (37.3 C), Min:98.6 F (37 C), Max:99.7 F (37.6 C)  Recent Labs  Lab 04/22/23 1102 04/26/23 1419  WBC 8.0 7.3  CREATININE 1.24* 0.89    Estimated Creatinine Clearance: 71.9 mL/min (by C-G formula based on SCr of 0.89 mg/dL).    Allergies  Allergen Reactions   Demerol [Meperidine Hcl] Rash    Redness and itching to IV insertion site    Hydromorphone Rash    Redness and itching to IV insertion site that extended up left arm 03/10/23    Dosage will likely remain stable at above dosage and need for further dosage adjustment appears unlikely at present.    Will sign off at this time.  Please reconsult if a change in clinical status warrants re-evaluation of dosage.   Adalberto Cole, PharmD, BCPS 04/26/2023 7:09 PM

## 2023-04-26 NOTE — ED Provider Notes (Signed)
[place] EMERGENCY DEPARTMENT AT [facility] Provider Note   CSN: [id number] Arrival date & time: 04/26/23  1338     History  No chief complaint on file.   Lorraine Yates is a 52 y.o. female with past medical history significant for cervical cancer, anemia, chemotherapy presents to the ED complaining of right sided abdominal pain that radiates into her flank.  Patient has been having the pain for 1 week.  She contacted her oncologist who scheduled an outpatient CT for Monday, however, patient's pain was becoming more severe prompting ED visit.  She has had decreased appetite, nausea, vomiting, and constipation.  Denies urinary symptoms, fever, chills.      Home Medications Prior to Admission medications   Medication Sig Start Date End Date Taking? Authorizing Provider  lidocaine-prilocaine (EMLA) cream Apply 1 Application topically as needed. Apply to port site 1 hour to 30 minutes before port is accessed 12/22/22   Artis Delay, MD  ondansetron (ZOFRAN) 8 MG tablet Take 8 mg by mouth every 8 (eight) hours as needed for nausea or vomiting.    [provider]  oxyCODONE (OXY IR/ROXICODONE) 5 MG immediate release tablet Take 1 tablet (5 mg) by mouth every 4 hours as needed for severe pain. 04/20/23   Artis Delay, MD  prochlorperazine (COMPAZINE) 10 MG tablet TAKE 1 TABLET(10 MG) BY MOUTH EVERY 6 HOURS AS NEEDED FOR NAUSEA OR VOMITING 01/21/23   Artis Delay, MD      Allergies    Demerol [meperidine hcl] and Hydromorphone    Review of Systems   Review of Systems  Constitutional:  Negative for chills and fever.  Gastrointestinal:  Positive for abdominal pain, constipation, nausea and vomiting. Negative for diarrhea.  Genitourinary:  Positive for flank pain. Negative for decreased urine volume, difficulty urinating, dysuria and urgency.    Physical Exam Updated Vital Signs BP 121/77 (BP Location: Left Arm)   Pulse (!) 101   Temp 99 F (37.2 C) (Oral)   Resp 16    SpO2 98%  Physical Exam Vitals and nursing note reviewed.  Constitutional:      General: She is not in acute distress.    Appearance: Normal appearance. She is not ill-appearing or diaphoretic.  Cardiovascular:     Rate and Rhythm: Normal rate and regular rhythm.  Pulmonary:     Effort: Pulmonary effort is normal.  Abdominal:     General: Bowel sounds are normal. There is no distension.     Palpations: Abdomen is soft.     Tenderness: There is abdominal tenderness in the right upper quadrant and right lower quadrant. There is guarding (voluntary). There is no right CVA tenderness, left CVA tenderness or rebound.     Comments: Tenderness of right flank, but no CVA tenderness.   Skin:    General: Skin is warm and dry.     Capillary Refill: Capillary refill takes less than 2 seconds.  Neurological:     Mental Status: She is alert. Mental status is at baseline.  Psychiatric:        Mood and Affect: Mood normal.        Behavior: Behavior normal.     ED Results / Procedures / Treatments   Labs (all labs ordered are listed, but only abnormal results are displayed) Labs Reviewed  COMPREHENSIVE METABOLIC PANEL - Abnormal; Notable for the following components:      Result Value   Sodium 133 (*)    Chloride 97 (*)  Glucose, Bld 103 (*)    Calcium 8.8 (*)    Albumin 3.0 (*)    Alkaline Phosphatase 221 (*)    All other components within normal limits  CBC WITH DIFFERENTIAL/PLATELET - Abnormal; Notable for the following components:   RBC 3.10 (*)    Hemoglobin 9.9 (*)    HCT 30.4 (*)    RDW 18.6 (*)    Lymphs Abs 0.5 (*)    All other components within normal limits  URINALYSIS, W/ REFLEX TO CULTURE (INFECTION SUSPECTED)  COMPREHENSIVE METABOLIC PANEL  MAGNESIUM  CBC    EKG None  Radiology US Abdomen Limited RUQ (LIVER/GB)  Result Date: 04/26/2023 CLINICAL DATA:  Abdominal pain EXAM: ULTRASOUND ABDOMEN LIMITED RIGHT UPPER QUADRANT COMPARISON:  Same day CT FINDINGS:  Gallbladder: Mild gallbladder wall thickening. No gallstones visualized. No sonographic Murphy sign noted by sonographer. Gallbladder polyp measuring 7 mm with thick. Common bile duct: Diameter: 7 mm Liver: Heterogeneous of the inferior right lobe of the liver of the liver with echogenic component and dirty shadowing measuring 5.1 x 3.2 x 4.6 cm. Within normal limits in parenchymal echogenicity. Portal vein is patent on color Doppler imaging with normal direction of blood flow towards the liver. Other: None. IMPRESSION: 1. Mild gallbladder wall thickening without gallstones visualized and negative sonographic Murphy sign. Findings are nonspecific and can be seen in the setting of cholecystitis, liver disease, or fluid overload states. 2. Heterogeneous lesion of the inferior right lobe of the liver, correlates with liver abscess seen on same day CT. 3. Mildly dilated common bile duct. Correlate with liver function tests. If LFTs are abnormal, consider further evaluation with MRCP. 4. Gallbladder polyp measuring 7 mm. Recommend follow-up gallbladder ultrasound in 12 months. Electronically Signed   By: Allegra Lai M.D.   On: 04/26/2023 19:50   CT ABDOMEN PELVIS W CONTRAST  Result Date: 04/26/2023 CLINICAL DATA:  Abdominal pain, acute, nonlocalized. Right flank pain over the last week. EXAM: CT ABDOMEN AND PELVIS WITH CONTRAST TECHNIQUE: Multidetector CT imaging of the abdomen and pelvis was performed using the standard protocol following bolus administration of intravenous contrast. RADIATION DOSE REDUCTION: This exam was performed according to the departmental dose-optimization program which includes automated exposure control, adjustment of the mA and/or kV according to patient size and/or use of iterative reconstruction technique. CONTRAST:  OMNIPAQUE IOHEXOL 300 MG/ML  SOLN COMPARISON:  Ultrasound 2 days ago. FINDINGS: Lower chest: Mild linear scar or atelectasis at the right lung base. No pleural  fluid. Hepatobiliary: No calcified gallstones or CT evidence of cholecystitis. Hepatomegaly. Liver abscess at the caudal tip of the right lobe with an air-fluid level measuring up to 5 cm in diameter. Adjacent appendicitis, presumably the etiology. See below. Pancreas: Normal Spleen: Normal Adrenals/Urinary Tract: Adrenal glands are normal. Kidneys are normal. Bladder is normal. Stomach/Bowel: Stomach and small intestine are normal. As noted above, there is acute appendicitis. The appendix is located in a retrocecal location, extending back up to the caudal tip of the liver. The appendix is perforated in this location and there is some phlegmonous inflammation, possibly with a small developing abscess measuring about 12 mm. This is adjacent and in contact with the liver edge and quite likely the etiology of the liver abscess. Vascular/Lymphatic: Aorta and IVC are normal.  No adenopathy. Reproductive: No pelvic organ pathology. Other: No free fluid or air. Musculoskeletal: Normal IMPRESSION: Acute appendicitis. The appendix is located in a retrocecal location, extending back up to the caudal tip of the  liver. The appendix is perforated in this location and there is some phlegmonous inflammation, possibly with a small developing abscess measuring about 12 mm. This is adjacent and in contact with the liver edge. There is a liver abscess at the caudal tip of the right lobe measuring up to 5 cm in size with a gas fluid level. The appendicitis is quite likely the etiology of the liver abscess. Electronically Signed   By: Paulina Fusi M.D.   On: 04/26/2023 17:41    Procedures Procedures    Medications Ordered in ED Medications  sodium chloride flush (NS) 0.9 % injection 3 mL (has no administration in time range)  lactated ringers infusion (has no administration in time range)  acetaminophen (TYLENOL) tablet 650 mg (has no administration in time range)    Or  acetaminophen (TYLENOL) suppository 650 mg (has no  administration in time range)  oxyCODONE (Oxy IR/ROXICODONE) immediate release tablet 5 mg (has no administration in time range)  morphine (PF) 2 MG/ML injection 2-4 mg (has no administration in time range)  ondansetron (ZOFRAN) tablet 4 mg (has no administration in time range)    Or  ondansetron (ZOFRAN) injection 4 mg (has no administration in time range)  piperacillin-tazobactam (ZOSYN) IVPB 3.375 g (has no administration in time range)  iohexol (OMNIPAQUE) 300 MG/ML solution 100 mL (100 mLs Intravenous Contrast Given 04/26/23 1456)  piperacillin-tazobactam (ZOSYN) IVPB 3.375 g (0 g Intravenous Stopped 04/26/23 1633)  morphine (PF) 4 MG/ML injection 4 mg (4 mg Intravenous Given 04/26/23 1607)  morphine (PF) 4 MG/ML injection 4 mg (4 mg Intravenous Given 04/26/23 1819)    ED Course/ Medical Decision Making/ A&P                                 Medical Decision Making Amount and/or Complexity of Data Reviewed Labs: ordered. Radiology: ordered.  Risk Prescription drug management. Decision regarding hospitalization.   This patient presents to the ED with chief complaint(s) of right sided abdominal pain and flank pain with pertinent past medical history of cervical cancer, undergoing chemotherapy.  The complaint involves an extensive differential diagnosis and also carries with it a high risk of complications and morbidity.    The differential diagnosis includes acute cholecystitis, appendicitis, choledocholithiasis, colitis, SBO, perforated bowel    The initial plan is to obtain labs, CT imaging  Additional history obtained: Records reviewed  oncology  Initial Assessment:   On exam, patient is sitting upright in bed and appears uncomfortable.  She is not toxic appearing.  Abdomen is soft with RUQ and RLQ tenderness.  Tenderness of right flank, but no right CVA tenderness.  No abdominal distension or overlying skin changes.    Independent ECG/labs interpretation:  The following  labs were independently interpreted:  CBC with anemia, appears chronic.  No leukocytosis.  Metabolic panel with elevated alk phos at 221.  Mild hyponatremia.    Independent visualization and interpretation of imaging: I independently visualized the following imaging with scope of interpretation limited to determining acute life threatening conditions related to emergency care: CT abdomen/pelvis, which revealed liver abscess in caudal tip of right lobe, acute appendicitis with perforation and possible abscess development.  [place] ordered as follow up.  [place] with heterogenous lesion of inferior right lobe of liver.  Mild gallbladder wall thickening without gallstones.  Mildly dilated common bile duct.  Gallbladder polyp present.   Treatment and Reassessment: Patient given IV Zosyn for  intra-abdominal infection.  Pain was managed well with morphine IV.  Patient has been NPO.    Consultations obtained:   I requested consultation with on-call general surgery provider and spoke with Dr. Luisa Hart.  No surgical intervention recommended at this time due to perforation.  Recommended admitting patient to medicine and IR procedure tomorrow to place drain.   Spoke with [facility] hospitalist on-call, Dr. Antionette Char, who agrees with hospital admission.  Informed hospitalist patient is ok to have liquids today and be NPO at midnight for procedure tomorrow.   Disposition:   Patient to be admitted to [facility] for perforated appendicitis and liver abscess.           Final Clinical Impression(s) / ED Diagnoses Final diagnoses:  Perforated appendicitis  Liver abscess    Rx / DC Orders ED Discharge Orders     None         Lenard Simmer, PA-C 04/26/23 2002    Anders Simmonds T, DO 04/27/23 [id number]

## 2023-04-26 NOTE — ED Triage Notes (Signed)
POV c/o R flank pain x 1 week. CT scheduled for Monday but PCP sent for eval d/t increase in pain. Pt receiving Chemo and denies any additional n/v than normal.

## 2023-04-26 NOTE — Telephone Encounter (Signed)
Called regarding mychart message. Per Dr. Bertis Ruddy, instructed to go the ER now to be evaluate. She verbalized understanding and will go now.

## 2023-04-26 NOTE — Plan of Care (Signed)

## 2023-04-26 NOTE — Consult Note (Signed)
Reason for Consult:perforated appendix Referring Physician: Odie Sera MD   Crist Infante SKYLIN KENNERSON is an 52 y.o. female.  HPI: Pleasant 52 yo female with 6 day hx of right flank pain, nausea and vomiting. Seen 4 days ago by oncologist since she is on active treatment for cervical cancer.  Pain is now more localized RUQ. CT shows perforated retrocecal appendix and right lliver lobe abscess.   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   oncologist--- dr gorsuch/  radiation oncologist--- dr Roselind Messier;  dx 05/ 2024;   chemo 01-20-2023 to 02-28-2023;   IMRT 01-20-2023 to 03-03-2023  to start high dose radiation 03-10-2023 w/ tandem   Port-A-Cath in place 12/16/2022    Past Surgical History:  Procedure Laterality Date   IR IMAGING GUIDED PORT INSERTION  12/16/2022   NO PAST SURGERIES     OPERATIVE ULTRASOUND N/A 03/10/2023   Procedure: OPERATIVE ULTRASOUND;  Surgeon: Antony Blackbird, MD;  Location: [facility];  Service: Urology;  Laterality: N/A;   OPERATIVE ULTRASOUND N/A 03/15/2023   Procedure: OPERATIVE ULTRASOUND;  Surgeon: Antony Blackbird, MD;  Location: [facility];  Service: Urology;  Laterality: N/A;   OPERATIVE ULTRASOUND N/A 03/21/2023   Procedure: OPERATIVE ULTRASOUND;  Surgeon: Antony Blackbird, MD;  Location: [facility];  Service: Urology;  Laterality: N/A;   OPERATIVE ULTRASOUND N/A 03/31/2023   Procedure: OPERATIVE ULTRASOUND;  Surgeon: Antony Blackbird, MD;  Location: [facility];  Service: Urology;  Laterality: N/A;   OPERATIVE ULTRASOUND N/A 04/07/2023   Procedure: OPERATIVE ULTRASOUND;  Surgeon: Antony Blackbird, MD;  Location: [facility];  Service: Urology;  Laterality: N/A;   TANDEM RING INSERTION N/A 03/10/2023   Procedure: TANDEM RING  INSERTION;  Surgeon: Antony Blackbird, MD;  Location: [facility];  Service: Urology;  Laterality: N/A;   TANDEM RING INSERTION N/A 03/15/2023   Procedure: TANDEM RING INSERTION;  Surgeon: Antony Blackbird, MD;  Location: [facility];  Service: Urology;  Laterality: N/A;   TANDEM RING INSERTION N/A 03/21/2023   Procedure: TANDEM RING INSERTION;  Surgeon: Antony Blackbird, MD;  Location: [facility];  Service: Urology;  Laterality: N/A;   TANDEM RING INSERTION N/A 03/31/2023   Procedure: TANDEM RING INSERTION;  Surgeon: Antony Blackbird, MD;  Location: [facility];  Service: Urology;  Laterality: N/A;   TANDEM RING INSERTION N/A 04/07/2023   Procedure: TANDEM RING INSERTION;  Surgeon: Antony Blackbird, MD;  Location: [facility];  Service: Urology;  Laterality: N/A;    Family History  Problem Relation Age of Onset   Alzheimer's disease Mother    Alzheimer's disease Father    Prostate cancer Father    Pancreatic cancer Neg Hx    Colon cancer Neg Hx    Breast cancer Neg Hx    Endometrial cancer Neg Hx     Social History:  reports that she has been smoking cigarettes. She has never used smokeless tobacco. She reports current alcohol use. She reports current drug use. Drug: Marijuana.  Allergies:  Allergies  Allergen Reactions   Demerol [Meperidine Hcl] Rash    Redness and itching to IV insertion site    Hydromorphone Rash    Redness and itching to IV insertion site that extended  up left arm 03/10/23    Medications: I have reviewed the patient's current medications.  Results for orders placed or performed during the hospital encounter of 04/26/23 (from the past 48 hour(s))  Comprehensive metabolic panel     Status: Abnormal   Collection Time: 04/26/23  2:19 PM  Result Value Ref Range   Sodium 133 (L) 135 - 145 mmol/L   Potassium 3.6 3.5 - 5.1 mmol/L   Chloride 97 (L) 98 - 111 mmol/L   CO2 25 22 - 32 mmol/L   Glucose, Bld 103  (H) 70 - 99 mg/dL    Comment: Glucose reference range applies only to samples taken after fasting for at least 8 hours.   BUN 11 6 - 20 mg/dL   Creatinine, Ser 6.29 0.44 - 1.00 mg/dL   Calcium 8.8 (L) 8.9 - 10.3 mg/dL   Total Protein 7.6 6.5 - 8.1 g/dL   Albumin 3.0 (L) 3.5 - 5.0 g/dL   AST 15 15 - 41 U/L   ALT 13 0 - 44 U/L   Alkaline Phosphatase 221 (H) 38 - 126 U/L   Total Bilirubin 0.9 0.3 - 1.2 mg/dL   GFR, Estimated >52 >84 mL/min    Comment: (NOTE) Calculated using the CKD-EPI Creatinine Equation (2021)    Anion gap 11 5 - 15    Comment: Performed at [facility], [address], [place], [place] [address]  CBC with Differential     Status: Abnormal   Collection Time: 04/26/23  2:19 PM  Result Value Ref Range   WBC 7.3 4.0 - 10.5 K/uL   RBC 3.10 (L) 3.87 - 5.11 MIL/uL   Hemoglobin 9.9 (L) 12.0 - 15.0 g/dL   HCT 01.0 (L) 27.2 - 53.6 %   MCV 98.1 80.0 - 100.0 fL   MCH 31.9 26.0 - 34.0 pg   MCHC 32.6 30.0 - 36.0 g/dL   RDW 64.4 (H) 03.4 - 74.2 %   Platelets 315 150 - 400 K/uL   nRBC 0.0 0.0 - 0.2 %   Neutrophils Relative % 84 %   Neutro Abs 6.1 1.7 - 7.7 K/uL   Lymphocytes Relative 7 %   Lymphs Abs 0.5 (L) 0.7 - 4.0 K/uL   Monocytes Relative 8 %   Monocytes Absolute 0.6 0.1 - 1.0 K/uL   Eosinophils Relative 1 %   Eosinophils Absolute 0.1 0.0 - 0.5 K/uL   Basophils Relative 0 %   Basophils Absolute 0.0 0.0 - 0.1 K/uL   Immature Granulocytes 0 %   Abs Immature Granulocytes 0.03 0.00 - 0.07 K/uL    Comment: Performed at [facility], [address], [place], [place] [address]    CT ABDOMEN PELVIS W CONTRAST  Result Date: 04/26/2023 CLINICAL DATA:  Abdominal pain, acute, nonlocalized. Right flank pain over the last week. EXAM: CT ABDOMEN AND PELVIS WITH CONTRAST TECHNIQUE: Multidetector CT imaging of the abdomen and pelvis was performed using the standard protocol following bolus administration of intravenous contrast. RADIATION  DOSE REDUCTION: This exam was performed according to the departmental dose-optimization program which includes automated exposure control, adjustment of the mA and/or kV according to patient size and/or use of iterative reconstruction technique. CONTRAST:  OMNIPAQUE IOHEXOL 300 MG/ML  SOLN COMPARISON:  Ultrasound 2 days ago. FINDINGS: Lower chest: Mild linear scar or atelectasis at the right lung base. No pleural fluid. Hepatobiliary: No calcified gallstones or CT evidence of cholecystitis. Hepatomegaly. Liver abscess at the caudal tip of the right  lobe with an air-fluid level measuring up to 5 cm in diameter. Adjacent appendicitis, presumably the etiology. See below. Pancreas: Normal Spleen: Normal Adrenals/Urinary Tract: Adrenal glands are normal. Kidneys are normal. Bladder is normal. Stomach/Bowel: Stomach and small intestine are normal. As noted above, there is acute appendicitis. The appendix is located in a retrocecal location, extending back up to the caudal tip of the liver. The appendix is perforated in this location and there is some phlegmonous inflammation, possibly with a small developing abscess measuring about 12 mm. This is adjacent and in contact with the liver edge and quite likely the etiology of the liver abscess. Vascular/Lymphatic: Aorta and IVC are normal.  No adenopathy. Reproductive: No pelvic organ pathology. Other: No free fluid or air. Musculoskeletal: Normal IMPRESSION: Acute appendicitis. The appendix is located in a retrocecal location, extending back up to the caudal tip of the liver. The appendix is perforated in this location and there is some phlegmonous inflammation, possibly with a small developing abscess measuring about 12 mm. This is adjacent and in contact with the liver edge. There is a liver abscess at the caudal tip of the right lobe measuring up to 5 cm in size with a gas fluid level. The appendicitis is quite likely the etiology of the liver abscess.  Electronically Signed   By: Paulina Fusi M.D.   On: 04/26/2023 17:41    Review of Systems  Constitutional:  Positive for chills, fatigue and fever.  HENT: Negative.    Gastrointestinal:  Positive for abdominal pain.  Psychiatric/Behavioral: Negative.     Blood pressure (!) 141/91, pulse 95, temperature 98.6 F (37 C), temperature source Oral, resp. rate 18, SpO2 99%. Physical Exam HENT:     Head: Normocephalic.  Eyes:     General: No scleral icterus.    Pupils: Pupils are equal, round, and reactive to light.  Cardiovascular:     Rate and Rhythm: Normal rate.  Pulmonary:     Effort: Pulmonary effort is normal.  Abdominal:     Palpations: Abdomen is soft.     Tenderness: There is abdominal tenderness in the right upper quadrant. There is no rebound. Negative signs include Murphy's sign and McBurney's sign.  Skin:    General: Skin is warm.  Neurological:     General: No focal deficit present.     Mental Status: She is alert.  Psychiatric:        Mood and Affect: Mood normal.     Assessment/Plan: Perforated appendicitis with liver abscess She is remarkably stable  IR for perc drain at this point  Clear liquids ok  IV ABX  Discussed with the patient plan of care  Dr Michaell Cowing to follow up in am   Maisie Fus A Akito Boomhower 04/26/2023, 7:33 PM   High complexity

## 2023-04-26 NOTE — H&P (Signed)
History and Physical    Lorraine Yates:[id number] DOB: 01-31-1971 DOA: 04/26/2023  PCP: Patient, No Pcp Per   Patient coming from: Home   Chief Complaint: Right flank pain   HPI: Lorraine Yates is a pleasant 52 y.o. female with medical history significant for cervical cancer managed with chemotherapy and radiation, now presenting with worsening right-sided abdominal pain.  Patient reports developing pain in the right flank 1 week ago.  She had nausea with nonbloody vomiting at the onset, but that has resolved.  She was also experiencing chills at home.  She was evaluated in the outpatient setting and CT was ordered, but with her pain continuing to worsen, she came into the ED.  ED Course: Upon arrival to the ED, patient is found to be afebrile and saturating well on room air with mild tachycardia and stable blood pressure.  Labs are most notable for normal WBC, hemoglobin 9.9, and normal renal function.  CT findings are concerning for acute perforated appendicitis with question of developing abscess and 5 cm abscess at the caudal tip of the right liver lobe.  Surgery (Dr. Luisa Hart) was consulted by the ED PA and the patient was treated with Zosyn and morphine in the ED.  Review of Systems:  All other systems reviewed and apart from HPI, are negative.  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   oncologist--- dr gorsuch/  radiation oncologist--- dr Roselind Messier;  dx 05/ 2024;   chemo 01-20-2023 to 02-28-2023;   IMRT 01-20-2023 to 03-03-2023  to start high dose radiation 03-10-2023 w/ tandem   Port-A-Cath in place 12/16/2022    Past Surgical History:  Procedure Laterality Date   IR IMAGING GUIDED PORT INSERTION  12/16/2022   NO PAST SURGERIES     OPERATIVE ULTRASOUND N/A 03/10/2023   Procedure: OPERATIVE  ULTRASOUND;  Surgeon: Antony Blackbird, MD;  Location: [facility];  Service: Urology;  Laterality: N/A;   OPERATIVE ULTRASOUND N/A 03/15/2023   Procedure: OPERATIVE ULTRASOUND;  Surgeon: Antony Blackbird, MD;  Location: [facility];  Service: Urology;  Laterality: N/A;   OPERATIVE ULTRASOUND N/A 03/21/2023   Procedure: OPERATIVE ULTRASOUND;  Surgeon: Antony Blackbird, MD;  Location: [facility];  Service: Urology;  Laterality: N/A;   OPERATIVE ULTRASOUND N/A 03/31/2023   Procedure: OPERATIVE ULTRASOUND;  Surgeon: Antony Blackbird, MD;  Location: [facility];  Service: Urology;  Laterality: N/A;   OPERATIVE ULTRASOUND N/A 04/07/2023   Procedure: OPERATIVE ULTRASOUND;  Surgeon: Antony Blackbird, MD;  Location: [facility];  Service: Urology;  Laterality: N/A;   TANDEM RING INSERTION N/A 03/10/2023   Procedure: TANDEM RING INSERTION;  Surgeon: Antony Blackbird, MD;  Location: [facility];  Service: Urology;  Laterality: N/A;   TANDEM RING INSERTION N/A 03/15/2023   Procedure: TANDEM RING INSERTION;  Surgeon: Antony Blackbird, MD;  Location: [facility];  Service: Urology;  Laterality: N/A;   TANDEM RING INSERTION N/A 03/21/2023   Procedure: TANDEM RING INSERTION;  Surgeon: Antony Blackbird, MD;  Location: [facility];  Service: Urology;  Laterality: N/A;   TANDEM RING INSERTION N/A 03/31/2023   Procedure: TANDEM RING INSERTION;  Surgeon: Antony Blackbird, MD;  Location: [facility];  Service: Urology;  Laterality: N/A;  TANDEM RING INSERTION N/A 04/07/2023   Procedure: TANDEM RING INSERTION;  Surgeon: Antony Blackbird, MD;  Location: [facility];  Service: Urology;  Laterality: N/A;    Social History:   reports that she has been smoking cigarettes. She has never used smokeless tobacco. She reports current alcohol use. She reports current drug use. Drug: Marijuana.  Allergies  Allergen  Reactions   Demerol [Meperidine Hcl] Rash    Redness and itching to IV insertion site    Hydromorphone Rash    Redness and itching to IV insertion site that extended up left arm 03/10/23    Family History  Problem Relation Age of Onset   Alzheimer's disease Mother    Alzheimer's disease Father    Prostate cancer Father    Pancreatic cancer Neg Hx    Colon cancer Neg Hx    Breast cancer Neg Hx    Endometrial cancer Neg Hx      Prior to Admission medications   Medication Sig Start Date End Date Taking? Authorizing Provider  lidocaine-prilocaine (EMLA) cream Apply 1 Application topically as needed. Apply to port site 1 hour to 30 minutes before port is accessed 12/22/22   Artis Delay, MD  ondansetron (ZOFRAN) 8 MG tablet Take 8 mg by mouth every 8 (eight) hours as needed for nausea or vomiting.    [provider]  oxyCODONE (OXY IR/ROXICODONE) 5 MG immediate release tablet Take 1 tablet (5 mg) by mouth every 4 hours as needed for severe pain. 04/20/23   Artis Delay, MD  prochlorperazine (COMPAZINE) 10 MG tablet TAKE 1 TABLET(10 MG) BY MOUTH EVERY 6 HOURS AS NEEDED FOR NAUSEA OR VOMITING 01/21/23   Artis Delay, MD    Physical Exam: Vitals:   04/26/23 1344 04/26/23 1757 04/26/23 1806  BP: 134/82 117/75 (!) 141/91  Pulse: (!) 109 95 95  Resp: 16 18 18   Temp: 99.7 F (37.6 C)  98.6 F (37 C)  TempSrc: Oral  Oral  SpO2: 99%  99%     Constitutional: NAD, calm  Eyes: PERTLA, lids and conjunctivae normal ENMT: Mucous membranes are moist. Posterior pharynx clear of any exudate or lesions.   Neck: supple, no masses  Respiratory: no wheezing, no crackles. No accessory muscle use.  Cardiovascular: S1 & S2 heard, regular rate and rhythm. No JVD. Abdomen: Soft, tender on right. Bowel sounds active.  Musculoskeletal: no clubbing / cyanosis. No joint deformity upper and lower extremities.   Skin: no significant rashes, lesions, ulcers. Warm, dry, well-perfused. Neurologic: CN  2-12 grossly intact. Moving all extremities. Alert and oriented.  Psychiatric: Pleasant. Cooperative.    Labs and Imaging on Admission: I have personally reviewed following labs and imaging studies  CBC: Recent Labs  Lab 04/22/23 1102 04/26/23 1419  WBC 8.0 7.3  NEUTROABS 6.9 6.1  HGB 9.4* 9.9*  HCT 27.4* 30.4*  MCV 95.1 98.1  PLT 202 315   Basic Metabolic Panel: Recent Labs  Lab 04/22/23 1102 04/26/23 1419  NA 132* 133*  K 3.6 3.6  CL 99 97*  CO2 26 25  GLUCOSE 109* 103*  BUN 26* 11  CREATININE 1.24* 0.89  CALCIUM 9.1 8.8*  MG 1.7  --    GFR: Estimated Creatinine Clearance: 71.9 mL/min (by C-G formula based on SCr of 0.89 mg/dL). Liver Function Tests: Recent Labs  Lab 04/22/23 1102 04/26/23 1419  AST 10* 15  ALT 7 13  ALKPHOS 148* 221*  BILITOT 0.7 0.9  PROT 6.8 7.6  ALBUMIN 3.4*  3.0*   No results for input(s): "LIPASE", "AMYLASE" in the last 168 hours. No results for input(s): "AMMONIA" in the last 168 hours. Coagulation Profile: No results for input(s): "INR", "PROTIME" in the last 168 hours. Cardiac Enzymes: No results for input(s): "CKTOTAL", "CKMB", "CKMBINDEX", "TROPONINI" in the last 168 hours. BNP (last 3 results) No results for input(s): "PROBNP" in the last 8760 hours. HbA1C: No results for input(s): "HGBA1C" in the last 72 hours. CBG: No results for input(s): "GLUCAP" in the last 168 hours. Lipid Profile: No results for input(s): "CHOL", "HDL", "LDLCALC", "TRIG", "CHOLHDL", "LDLDIRECT" in the last 72 hours. Thyroid Function Tests: No results for input(s): "TSH", "T4TOTAL", "FREET4", "T3FREE", "THYROIDAB" in the last 72 hours. Anemia Panel: No results for input(s): "VITAMINB12", "FOLATE", "FERRITIN", "TIBC", "IRON", "RETICCTPCT" in the last 72 hours. Urine analysis:    Component Value Date/Time   COLORURINE YELLOW 02/22/2023 1518   APPEARANCEUR HAZY (A) 02/22/2023 1518   LABSPEC 1.027 02/22/2023 1518   PHURINE 5.0 02/22/2023 1518    GLUCOSEU 50 (A) 02/22/2023 1518   HGBUR MODERATE (A) 02/22/2023 1518   BILIRUBINUR NEGATIVE 02/22/2023 1518   KETONESUR 5 (A) 02/22/2023 1518   PROTEINUR 100 (A) 02/22/2023 1518   NITRITE NEGATIVE 02/22/2023 1518   LEUKOCYTESUR MODERATE (A) 02/22/2023 1518   Sepsis Labs: @LABRCNTIP (procalcitonin:4,lacticidven:4) )No results found for this or any previous visit (from the past 240 hour(s)).   Radiological Exams on Admission: CT ABDOMEN PELVIS W CONTRAST  Result Date: 04/26/2023 CLINICAL DATA:  Abdominal pain, acute, nonlocalized. Right flank pain over the last week. EXAM: CT ABDOMEN AND PELVIS WITH CONTRAST TECHNIQUE: Multidetector CT imaging of the abdomen and pelvis was performed using the standard protocol following bolus administration of intravenous contrast. RADIATION DOSE REDUCTION: This exam was performed according to the departmental dose-optimization program which includes automated exposure control, adjustment of the mA and/or kV according to patient size and/or use of iterative reconstruction technique. CONTRAST:  OMNIPAQUE IOHEXOL 300 MG/ML  SOLN COMPARISON:  Ultrasound 2 days ago. FINDINGS: Lower chest: Mild linear scar or atelectasis at the right lung base. No pleural fluid. Hepatobiliary: No calcified gallstones or CT evidence of cholecystitis. Hepatomegaly. Liver abscess at the caudal tip of the right lobe with an air-fluid level measuring up to 5 cm in diameter. Adjacent appendicitis, presumably the etiology. See below. Pancreas: Normal Spleen: Normal Adrenals/Urinary Tract: Adrenal glands are normal. Kidneys are normal. Bladder is normal. Stomach/Bowel: Stomach and small intestine are normal. As noted above, there is acute appendicitis. The appendix is located in a retrocecal location, extending back up to the caudal tip of the liver. The appendix is perforated in this location and there is some phlegmonous inflammation, possibly with a small developing abscess measuring about  12 mm. This is adjacent and in contact with the liver edge and quite likely the etiology of the liver abscess. Vascular/Lymphatic: Aorta and IVC are normal.  No adenopathy. Reproductive: No pelvic organ pathology. Other: No free fluid or air. Musculoskeletal: Normal IMPRESSION: Acute appendicitis. The appendix is located in a retrocecal location, extending back up to the caudal tip of the liver. The appendix is perforated in this location and there is some phlegmonous inflammation, possibly with a small developing abscess measuring about 12 mm. This is adjacent and in contact with the liver edge. There is a liver abscess at the caudal tip of the right lobe measuring up to 5 cm in size with a gas fluid level. The appendicitis is quite likely the etiology of the  liver abscess. Electronically Signed   By: Paulina Fusi M.D.   On: 04/26/2023 17:41     Assessment/Plan   1. Acute appendicitis  - Continue bowel rest, Zosyn, IVF hydration, and follow-up surgery recommendations    2. Liver abscess  - Likely d/t direct spread from adjacent perforated appendix  - Continue treatment with Zosyn, consult IR for drainage   3. Cervical cancer  - Undergoing brachytherapy and chemotherapy under the care of Dr. Bertis Ruddy     DVT prophylaxis: SCDs  Code Status: Full  Level of Care: Level of care: Telemetry Family Communication: None present   Disposition Plan: Patient is from: Home  Anticipated d/c is to: Home  Anticipated d/c date is: 04/29/23  Patient currently: Pending surgery consultation, likely IR consultation  Consults called: Surgery  Admission status: Inpatient     Briscoe Deutscher, MD [facility]  04/26/2023, 6:56 PM

## 2023-04-27 ENCOUNTER — Encounter (HOSPITAL_COMMUNITY): Payer: Self-pay | Admitting: Family Medicine

## 2023-04-27 ENCOUNTER — Inpatient Hospital Stay (HOSPITAL_COMMUNITY): Payer: BC Managed Care – PPO

## 2023-04-27 ENCOUNTER — Other Ambulatory Visit: Payer: Self-pay | Admitting: Hematology and Oncology

## 2023-04-27 DIAGNOSIS — K750 Abscess of liver: Secondary | ICD-10-CM | POA: Diagnosis not present

## 2023-04-27 DIAGNOSIS — C538 Malignant neoplasm of overlapping sites of cervix uteri: Secondary | ICD-10-CM | POA: Diagnosis not present

## 2023-04-27 DIAGNOSIS — K3532 Acute appendicitis with perforation and localized peritonitis, without abscess: Secondary | ICD-10-CM | POA: Diagnosis not present

## 2023-04-27 LAB — CBC
HCT: 27 % — ABNORMAL LOW (ref 36.0–46.0)
Hemoglobin: 8.7 g/dL — ABNORMAL LOW (ref 12.0–15.0)
MCH: 32.1 pg (ref 26.0–34.0)
MCHC: 32.2 g/dL (ref 30.0–36.0)
MCV: 99.6 fL (ref 80.0–100.0)
Platelets: 303 10*3/uL (ref 150–400)
RBC: 2.71 MIL/uL — ABNORMAL LOW (ref 3.87–5.11)
RDW: 18.6 % — ABNORMAL HIGH (ref 11.5–15.5)
WBC: 6.6 10*3/uL (ref 4.0–10.5)
nRBC: 0 % (ref 0.0–0.2)

## 2023-04-27 LAB — URINALYSIS, W/ REFLEX TO CULTURE (INFECTION SUSPECTED)
Bacteria, UA: NONE SEEN
Bilirubin Urine: NEGATIVE
Glucose, UA: NEGATIVE mg/dL
Hgb urine dipstick: NEGATIVE
Ketones, ur: 20 mg/dL — AB
Leukocytes,Ua: NEGATIVE
Nitrite: NEGATIVE
Protein, ur: 30 mg/dL — AB
Specific Gravity, Urine: >1.046 — ABNORMAL HIGH (ref 1.005–1.030)
pH: 5 (ref 5.0–8.0)

## 2023-04-27 LAB — COMPREHENSIVE METABOLIC PANEL
ALT: 11 U/L (ref 0–44)
AST: 11 U/L — ABNORMAL LOW (ref 15–41)
Albumin: 2.6 g/dL — ABNORMAL LOW (ref 3.5–5.0)
Alkaline Phosphatase: 188 U/L — ABNORMAL HIGH (ref 38–126)
Anion gap: 10 (ref 5–15)
BUN: 13 mg/dL (ref 6–20)
CO2: 28 mmol/L (ref 22–32)
Calcium: 8.3 mg/dL — ABNORMAL LOW (ref 8.9–10.3)
Chloride: 97 mmol/L — ABNORMAL LOW (ref 98–111)
Creatinine, Ser: 0.82 mg/dL (ref 0.44–1.00)
GFR, Estimated: >60 mL/min (ref >60)
Glucose, Bld: 92 mg/dL (ref 70–99)
Potassium: 3.1 mmol/L — ABNORMAL LOW (ref 3.5–5.1)
Sodium: 135 mmol/L (ref 135–145)
Total Bilirubin: 0.8 mg/dL (ref 0.3–1.2)
Total Protein: 6.8 g/dL (ref 6.5–8.1)

## 2023-04-27 LAB — MAGNESIUM: Magnesium: 1.9 mg/dL (ref 1.7–2.4)

## 2023-04-27 MED ORDER — FENTANYL CITRATE (PF) 100 MCG/2ML IJ SOLN
INTRAMUSCULAR | Status: AC | PRN
Start: 2023-04-27 — End: 2023-04-27
  Administered 2023-04-27: 50 ug via INTRAVENOUS

## 2023-04-27 MED ORDER — NALOXONE HCL 0.4 MG/ML IJ SOLN
INTRAMUSCULAR | Status: AC
  Filled 2023-04-27: qty 1

## 2023-04-27 MED ORDER — FENTANYL CITRATE (PF) 100 MCG/2ML IJ SOLN
INTRAMUSCULAR | Status: AC
  Filled 2023-04-27: qty 4

## 2023-04-27 MED ORDER — POTASSIUM CHLORIDE CRYS ER 20 MEQ PO TBCR
40.0000 meq | EXTENDED_RELEASE_TABLET | ORAL | Status: AC
  Administered 2023-04-27: 40 meq via ORAL
  Filled 2023-04-27 (×2): qty 2

## 2023-04-27 MED ORDER — FLUMAZENIL 0.5 MG/5ML IV SOLN
INTRAVENOUS | Status: AC
  Filled 2023-04-27: qty 5

## 2023-04-27 MED ORDER — SODIUM CHLORIDE 0.9% FLUSH
10.0000 mL | Freq: Two times a day (BID) | INTRAVENOUS | Status: DC
  Administered 2023-04-27 – 2023-04-29 (×4): 10 mL

## 2023-04-27 MED ORDER — CHLORHEXIDINE GLUCONATE CLOTH 2 % EX PADS
6.0000 | MEDICATED_PAD | Freq: Every day | CUTANEOUS | Status: DC
  Administered 2023-04-27 – 2023-04-29 (×3): 6 via TOPICAL

## 2023-04-27 MED ORDER — MIDAZOLAM HCL 2 MG/2ML IJ SOLN
INTRAMUSCULAR | Status: AC
  Filled 2023-04-27: qty 4

## 2023-04-27 MED ORDER — SODIUM CHLORIDE 0.9% FLUSH: 10.0000 mL | INTRAVENOUS | Status: DC | PRN

## 2023-04-27 MED ORDER — LIDOCAINE HCL (PF) 1 % IJ SOLN
INTRAMUSCULAR | Status: AC | PRN
  Administered 2023-04-27: 10 mL via INTRADERMAL

## 2023-04-27 MED ORDER — MIDAZOLAM HCL 2 MG/2ML IJ SOLN
INTRAMUSCULAR | Status: AC | PRN
Start: 2023-04-27 — End: 2023-04-27
  Administered 2023-04-27: 2 mg via INTRAVENOUS

## 2023-04-27 NOTE — Progress Notes (Signed)
Progress Note     Subjective: Abdominal pain much improved this am and appetite returned and she is hungry. Had a bowel movement for this first time in about a week   Objective: Vital signs in last 24 hours: Temp:  [98.5 F (36.9 C)-99.7 F (37.6 C)] 99 F (37.2 C) (10/16 0816) Pulse Rate:  [94-109] 94 (10/16 0816) Resp:  [16-18] 16 (10/16 0816) BP: (106-141)/(65-91) 109/72 (10/16 0816) SpO2:  [98 %-100 %] 99 % (10/16 0816) Weight:  [67.8 kg-69.3 kg] 69.3 kg (10/16 0500) Last BM Date : 04/25/23  Intake/Output from previous day: 10/15 0701 - 10/16 0700 In: 120 [P.O.:120] Out: -  Intake/Output this shift: No intake/output data recorded.  PE: General: pleasant, WD, female who is laying in bed in NAD Lungs:Respiratory effort nonlabored Abd: soft, ND, moderate TTP RUQ and right mid abdomen MSK: all 4 extremities are symmetrical with no cyanosis, clubbing, or edema. Skin: warm and dry  Psych: A&Ox3 with an appropriate affect.    Lab Results:  Recent Labs    04/26/23 1419 04/27/23 0017  WBC 7.3 6.6  HGB 9.9* 8.7*  HCT 30.4* 27.0*  PLT 315 303   BMET Recent Labs    04/26/23 1419 04/27/23 0017  NA 133* 135  K 3.6 3.1*  CL 97* 97*  CO2 25 28  GLUCOSE 103* 92  BUN 11 13  CREATININE 0.89 0.82  CALCIUM 8.8* 8.3*   PT/INR No results for input(s): "LABPROT", "INR" in the last 72 hours. CMP     Component Value Date/Time   NA 135 04/27/2023 0017   K 3.1 (L) 04/27/2023 0017   CL 97 (L) 04/27/2023 0017   CO2 28 04/27/2023 0017   GLUCOSE 92 04/27/2023 0017   BUN 13 04/27/2023 0017   CREATININE 0.82 04/27/2023 0017   CREATININE 1.24 (H) 04/22/2023 1102   CALCIUM 8.3 (L) 04/27/2023 0017   PROT 6.8 04/27/2023 0017   ALBUMIN 2.6 (L) 04/27/2023 0017   AST 11 (L) 04/27/2023 0017   AST 10 (L) 04/22/2023 1102   ALT 11 04/27/2023 0017   ALT 7 04/22/2023 1102   ALKPHOS 188 (H) 04/27/2023 0017   BILITOT 0.8 04/27/2023 0017   BILITOT 0.7 04/22/2023 1102    GFRNONAA >60 04/27/2023 0017   GFRNONAA 53 (L) 04/22/2023 1102   Lipase  No results found for: "LIPASE"     Studies/Results: US Abdomen Limited RUQ (LIVER/GB)  Result Date: 04/26/2023 CLINICAL DATA:  Abdominal pain EXAM: ULTRASOUND ABDOMEN LIMITED RIGHT UPPER QUADRANT COMPARISON:  Same day CT FINDINGS: Gallbladder: Mild gallbladder wall thickening. No gallstones visualized. No sonographic Murphy sign noted by sonographer. Gallbladder polyp measuring 7 mm with thick. Common bile duct: Diameter: 7 mm Liver: Heterogeneous of the inferior right lobe of the liver of the liver with echogenic component and dirty shadowing measuring 5.1 x 3.2 x 4.6 cm. Within normal limits in parenchymal echogenicity. Portal vein is patent on color Doppler imaging with normal direction of blood flow towards the liver. Other: None. IMPRESSION: 1. Mild gallbladder wall thickening without gallstones visualized and negative sonographic Murphy sign. Findings are nonspecific and can be seen in the setting of cholecystitis, liver disease, or fluid overload states. 2. Heterogeneous lesion of the inferior right lobe of the liver, correlates with liver abscess seen on same day CT. 3. Mildly dilated common bile duct. Correlate with liver function tests. If LFTs are abnormal, consider further evaluation with MRCP. 4. Gallbladder polyp measuring 7 mm. Recommend follow-up gallbladder ultrasound in  12 months. Electronically Signed   By: Allegra Lai M.D.   On: 04/26/2023 19:50   CT ABDOMEN PELVIS W CONTRAST  Result Date: 04/26/2023 CLINICAL DATA:  Abdominal pain, acute, nonlocalized. Right flank pain over the last week. EXAM: CT ABDOMEN AND PELVIS WITH CONTRAST TECHNIQUE: Multidetector CT imaging of the abdomen and pelvis was performed using the standard protocol following bolus administration of intravenous contrast. RADIATION DOSE REDUCTION: This exam was performed according to the departmental dose-optimization program which  includes automated exposure control, adjustment of the mA and/or kV according to patient size and/or use of iterative reconstruction technique. CONTRAST:  OMNIPAQUE IOHEXOL 300 MG/ML  SOLN COMPARISON:  Ultrasound 2 days ago. FINDINGS: Lower chest: Mild linear scar or atelectasis at the right lung base. No pleural fluid. Hepatobiliary: No calcified gallstones or CT evidence of cholecystitis. Hepatomegaly. Liver abscess at the caudal tip of the right lobe with an air-fluid level measuring up to 5 cm in diameter. Adjacent appendicitis, presumably the etiology. See below. Pancreas: Normal Spleen: Normal Adrenals/Urinary Tract: Adrenal glands are normal. Kidneys are normal. Bladder is normal. Stomach/Bowel: Stomach and small intestine are normal. As noted above, there is acute appendicitis. The appendix is located in a retrocecal location, extending back up to the caudal tip of the liver. The appendix is perforated in this location and there is some phlegmonous inflammation, possibly with a small developing abscess measuring about 12 mm. This is adjacent and in contact with the liver edge and quite likely the etiology of the liver abscess. Vascular/Lymphatic: Aorta and IVC are normal.  No adenopathy. Reproductive: No pelvic organ pathology. Other: No free fluid or air. Musculoskeletal: Normal IMPRESSION: Acute appendicitis. The appendix is located in a retrocecal location, extending back up to the caudal tip of the liver. The appendix is perforated in this location and there is some phlegmonous inflammation, possibly with a small developing abscess measuring about 12 mm. This is adjacent and in contact with the liver edge. There is a liver abscess at the caudal tip of the right lobe measuring up to 5 cm in size with a gas fluid level. The appendicitis is quite likely the etiology of the liver abscess. Electronically Signed   By: Paulina Fusi M.D.   On: 04/26/2023 17:41    Anti-infectives: Anti-infectives (From  admission, onward)    Start     Dose/Rate Route Frequency Ordered Stop   04/26/23 2200  piperacillin-tazobactam (ZOSYN) IVPB 3.375 g        3.375 g 12.5 mL/hr over 240 Minutes Intravenous Every 8 hours 04/26/23 1908     04/26/23 1600  piperacillin-tazobactam (ZOSYN) IVPB 3.375 g        3.375 g 100 mL/hr over 30 Minutes Intravenous  Once 04/26/23 1554 04/26/23 1633        Assessment/Plan Perforated appendicitis with liver abscess   - recommend IR perc drain placement - NPO for IR eval/intervention, then can have CLD from our standpoint - cont IV abx - will follow. She will likely benefit from interval appendectomy in approx 6 weeks pending improvement from acute infection - recommend ongoing bowel regimen for constipation prevention  FEN: NPO ID: [id number] for chemical prophylaxis from surgical standpoint. Await IR reccs  Per primary Cervical cancer s/p chemo and radiation. On immunotherapy anemia  I reviewed Consultant Oncology notes, hospitalist notes, last 24 h vitals and pain scores, last 48 h intake and output, last 24 h labs and trends, and last 24 h imaging results.  LOS: 1 day   Eric Form, [facility] 04/27/2023, 8:20 AM Please see Amion for pager number during day hours 7:00am-4:30pm

## 2023-04-27 NOTE — Plan of Care (Signed)
Problem: Education: Goal: Knowledge of General Education information will improve Description: Including pain rating scale, medication(s)/side effects and non-pharmacologic comfort measures Outcome: Progressing   Problem: Clinical Measurements: Goal: Ability to maintain clinical measurements within normal limits will improve Outcome: Progressing   Problem: Activity: Goal: Risk for activity intolerance will decrease Outcome: Progressing   Problem: Nutrition: Goal: Adequate nutrition will be maintained Outcome: Progressing   Problem: Coping: Goal: Level of anxiety will decrease Outcome: Progressing   Problem: Pain Managment: Goal: General experience of comfort will improve Outcome: Progressing

## 2023-04-27 NOTE — Hospital Course (Signed)
52 y.o. female with medical history significant for cervical cancer managed with chemotherapy and radiation, now presenting with worsening right-sided abdominal pain.   Patient reports developing pain in the right flank 1 week ago.  She had nausea with nonbloody vomiting at the onset, but that has resolved.  She was also experiencing chills at home.  She was evaluated in the outpatient setting and CT was ordered, but with her pain continuing to worsen, she came into the ED.   ED Course: Upon arrival to the ED, patient is found to be afebrile and saturating well on room air with mild tachycardia and stable blood pressure.  Labs are most notable for normal WBC, hemoglobin 9.9, and normal renal function.  CT findings are concerning for acute perforated appendicitis with question of developing abscess and 5 cm abscess at the caudal tip of the right liver lobe.   Surgery (Dr. Luisa Hart) was consulted by the ED

## 2023-04-27 NOTE — Procedures (Signed)
Interventional Radiology Procedure Note  Procedure: CT guided peri-hepatic abscess drain placement (10 fr)  Indication: Peri-hepatic abscess  Findings: Please refer to procedural dictation for full description.  Complications: None  EBL: < 10 mL  Acquanetta Belling, MD [phone]

## 2023-04-27 NOTE — Consult Note (Signed)
Chief Complaint: Patient was seen in consultation today for perihepatic fluid collection drain at the request of Dr. Warren Lacy  Supervising Physician: Mir, Mauri Reading  Patient Status: [facility] - In-pt  History of Present Illness: Lorraine Yates is a 52 y.o. female significant for cervical cancer with current chemotherapy treatment. On this admit she presented with right flank pain, nausea and vomiting.On 10/15 a ct abdomen and pelvis revealed her appendix is perforated with a small developing abscess and a 5 cm liver abscess at the caudal tip of the right lobe. A request was made for consideration for abscess  drain by Dr. 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   oncologist--- dr gorsuch/  radiation oncologist--- dr Roselind Messier;  dx 05/ 2024;   chemo 01-20-2023 to 02-28-2023;   IMRT 01-20-2023 to 03-03-2023  to start high dose radiation 03-10-2023 w/ tandem   Port-A-Cath in place 12/16/2022    Past Surgical History:  Procedure Laterality Date   IR IMAGING GUIDED PORT INSERTION  12/16/2022   NO PAST SURGERIES     OPERATIVE ULTRASOUND N/A 03/10/2023   Procedure: OPERATIVE ULTRASOUND;  Surgeon: Antony Blackbird, MD;  Location: [facility];  Service: Urology;  Laterality: N/A;   OPERATIVE ULTRASOUND N/A 03/15/2023   Procedure: OPERATIVE ULTRASOUND;  Surgeon: Antony Blackbird, MD;  Location: [facility];  Service: Urology;  Laterality: N/A;   OPERATIVE ULTRASOUND N/A 03/21/2023   Procedure: OPERATIVE ULTRASOUND;  Surgeon: Antony Blackbird, MD;  Location: [facility];  Service: Urology;  Laterality: N/A;   OPERATIVE ULTRASOUND N/A 03/31/2023   Procedure: OPERATIVE ULTRASOUND;  Surgeon: Antony Blackbird, MD;  Location: [facility];  Service: Urology;   Laterality: N/A;   OPERATIVE ULTRASOUND N/A 04/07/2023   Procedure: OPERATIVE ULTRASOUND;  Surgeon: Antony Blackbird, MD;  Location: [facility];  Service: Urology;  Laterality: N/A;   TANDEM RING INSERTION N/A 03/10/2023   Procedure: TANDEM RING INSERTION;  Surgeon: Antony Blackbird, MD;  Location: [facility];  Service: Urology;  Laterality: N/A;   TANDEM RING INSERTION N/A 03/15/2023   Procedure: TANDEM RING INSERTION;  Surgeon: Antony Blackbird, MD;  Location: [facility];  Service: Urology;  Laterality: N/A;   TANDEM RING INSERTION N/A 03/21/2023   Procedure: TANDEM RING INSERTION;  Surgeon: Antony Blackbird, MD;  Location: [facility];  Service: Urology;  Laterality: N/A;   TANDEM RING INSERTION N/A 03/31/2023   Procedure: TANDEM RING INSERTION;  Surgeon: Antony Blackbird, MD;  Location: [facility];  Service: Urology;  Laterality: N/A;   TANDEM RING INSERTION N/A 04/07/2023   Procedure: TANDEM RING INSERTION;  Surgeon: Antony Blackbird, MD;  Location: [facility];  Service: Urology;  Laterality: N/A;    Allergies: Demerol [meperidine hcl] and Hydromorphone  Medications: Prior to Admission medications   Medication Sig Start Date End Date Taking? Authorizing Provider  lidocaine-prilocaine (EMLA) cream Apply 1 Application topically as needed. Apply to port site 1 hour to 30 minutes before port is accessed 12/22/22   Artis Delay, MD  ondansetron (ZOFRAN) 8 MG tablet Take 8 mg by mouth every 8 (eight) hours as needed for nausea or vomiting.    [provider]  oxyCODONE (OXY IR/ROXICODONE) 5 MG  immediate release tablet Take 1 tablet (5 mg) by mouth every 4 hours as needed for severe pain. 04/20/23   Artis Delay, MD  prochlorperazine (COMPAZINE) 10 MG tablet TAKE 1 TABLET(10 MG) BY MOUTH EVERY 6 HOURS AS NEEDED FOR NAUSEA OR VOMITING 01/21/23   Artis Delay, MD     Family History  Problem Relation Age of Onset    Alzheimer's disease Mother    Alzheimer's disease Father    Prostate cancer Father    Pancreatic cancer Neg Hx    Colon cancer Neg Hx    Breast cancer Neg Hx    Endometrial cancer Neg Hx     Social History   Socioeconomic History   Marital status: Married    Spouse name: Not on file   Number of children: 2   Years of education: Not on file   Highest education level: Not on file  Occupational History   Occupation: personal shopper  Tobacco Use   Smoking status: Every Day    Current packs/day: 0.15    Types: Cigarettes   Smokeless tobacco: Never   Tobacco comments:    03-03-2023  Socially - 1 pack per week,   per pt started smoking age 89,  and has cut down to 1 cig per day trying to quit  Vaping Use   Vaping status: Never Used  Substance and Sexual Activity   Alcohol use: Yes    Comment: socially   Drug use: Yes    Types: Marijuana    Comment: 03-03-2023  per pt average since cancer dx once monthly (previously THC - once q 2 weeks)   Sexual activity: Not Currently    Birth control/protection: None  Other Topics Concern   Not on file  Social History Narrative   Not on file   Social Determinants of Health   Financial Resource Strain: Not on file  Food Insecurity: No Food Insecurity (04/26/2023)   Hunger Vital Sign    Worried About Running Out of Food in the Last Year: Never true    Ran Out of Food in the Last Year: Never true  Transportation Needs: No Transportation Needs (04/26/2023)   PRAPARE - Administrator, Civil Service (Medical): No    Lack of Transportation (Non-Medical): No  Physical Activity: Not on file  Stress: Not on file  Social Connections: Moderately Isolated (11/25/2022)   Social Connection and Isolation Panel [NHANES]    Frequency of Communication with Friends and Family: More than three times a week    Frequency of Social Gatherings with Friends and Family: More than three times a week    Attends Religious Services: Never    Automotive engineer or Organizations: No    Attends Banker Meetings: Never    Marital Status: Married    Review of Systems: A 12 point ROS discussed and pertinent positives are indicated in the HPI above.  All other systems are negative.  Review of Systems  Constitutional:  Positive for fatigue. Negative for chills and fever.  Respiratory:  Negative for cough, chest tightness and shortness of breath.   Cardiovascular:  Negative for chest pain and palpitations.  Gastrointestinal:  Positive for abdominal distention and abdominal pain.       Right side flank and abdominal pain and tenderness  Neurological:  Negative for headaches.  Psychiatric/Behavioral:  Negative for behavioral problems and confusion.     Vital Signs: BP 109/72 (BP Location: Left Arm)   Pulse 94  Temp 99 F (37.2 C) (Oral)   Resp 16   Ht 5\' 4"  (1.626 m)   Wt 152 lb 12.5 oz (69.3 kg)   SpO2 99%   BMI 26.22 kg/m     Physical Exam Constitutional:      Appearance: She is normal weight.  HENT:     Mouth/Throat:     Mouth: Mucous membranes are moist.  Cardiovascular:     Rate and Rhythm: Normal rate and regular rhythm.     Pulses: Normal pulses.     Heart sounds: Normal heart sounds.  Pulmonary:     Effort: Pulmonary effort is normal. No respiratory distress.     Breath sounds: Normal breath sounds. No wheezing.  Abdominal:     General: Bowel sounds are normal.     Palpations: Abdomen is soft.     Tenderness: There is abdominal tenderness.     Comments: Right flank and abdominal tenderness to palpation  Musculoskeletal:     Right lower leg: No edema.     Left lower leg: No edema.  Skin:    General: Skin is warm and dry.  Neurological:     Mental Status: She is alert and oriented to person, place, and time.  Psychiatric:        Mood and Affect: Mood normal.        Behavior: Behavior normal.     Imaging: US Abdomen Limited RUQ (LIVER/GB)  Result Date: 04/26/2023 CLINICAL DATA:   Abdominal pain EXAM: ULTRASOUND ABDOMEN LIMITED RIGHT UPPER QUADRANT COMPARISON:  Same day CT FINDINGS: Gallbladder: Mild gallbladder wall thickening. No gallstones visualized. No sonographic Murphy sign noted by sonographer. Gallbladder polyp measuring 7 mm with thick. Common bile duct: Diameter: 7 mm Liver: Heterogeneous of the inferior right lobe of the liver of the liver with echogenic component and dirty shadowing measuring 5.1 x 3.2 x 4.6 cm. Within normal limits in parenchymal echogenicity. Portal vein is patent on color Doppler imaging with normal direction of blood flow towards the liver. Other: None. IMPRESSION: 1. Mild gallbladder wall thickening without gallstones visualized and negative sonographic Murphy sign. Findings are nonspecific and can be seen in the setting of cholecystitis, liver disease, or fluid overload states. 2. Heterogeneous lesion of the inferior right lobe of the liver, correlates with liver abscess seen on same day CT. 3. Mildly dilated common bile duct. Correlate with liver function tests. If LFTs are abnormal, consider further evaluation with MRCP. 4. Gallbladder polyp measuring 7 mm. Recommend follow-up gallbladder ultrasound in 12 months. Electronically Signed   By: Allegra Lai M.D.   On: 04/26/2023 19:50   CT ABDOMEN PELVIS W CONTRAST  Result Date: 04/26/2023 CLINICAL DATA:  Abdominal pain, acute, nonlocalized. Right flank pain over the last week. EXAM: CT ABDOMEN AND PELVIS WITH CONTRAST TECHNIQUE: Multidetector CT imaging of the abdomen and pelvis was performed using the standard protocol following bolus administration of intravenous contrast. RADIATION DOSE REDUCTION: This exam was performed according to the departmental dose-optimization program which includes automated exposure control, adjustment of the mA and/or kV according to patient size and/or use of iterative reconstruction technique. CONTRAST:  OMNIPAQUE IOHEXOL 300 MG/ML  SOLN COMPARISON:  Ultrasound  2 days ago. FINDINGS: Lower chest: Mild linear scar or atelectasis at the right lung base. No pleural fluid. Hepatobiliary: No calcified gallstones or CT evidence of cholecystitis. Hepatomegaly. Liver abscess at the caudal tip of the right lobe with an air-fluid level measuring up to 5 cm in diameter. Adjacent appendicitis, presumably  the etiology. See below. Pancreas: Normal Spleen: Normal Adrenals/Urinary Tract: Adrenal glands are normal. Kidneys are normal. Bladder is normal. Stomach/Bowel: Stomach and small intestine are normal. As noted above, there is acute appendicitis. The appendix is located in a retrocecal location, extending back up to the caudal tip of the liver. The appendix is perforated in this location and there is some phlegmonous inflammation, possibly with a small developing abscess measuring about 12 mm. This is adjacent and in contact with the liver edge and quite likely the etiology of the liver abscess. Vascular/Lymphatic: Aorta and IVC are normal.  No adenopathy. Reproductive: No pelvic organ pathology. Other: No free fluid or air. Musculoskeletal: Normal IMPRESSION: Acute appendicitis. The appendix is located in a retrocecal location, extending back up to the caudal tip of the liver. The appendix is perforated in this location and there is some phlegmonous inflammation, possibly with a small developing abscess measuring about 12 mm. This is adjacent and in contact with the liver edge. There is a liver abscess at the caudal tip of the right lobe measuring up to 5 cm in size with a gas fluid level. The appendicitis is quite likely the etiology of the liver abscess. Electronically Signed   By: Paulina Fusi M.D.   On: 04/26/2023 17:41   US Abdomen Limited RUQ (LIVER/GB)  Result Date: 04/23/2023 CLINICAL DATA:  Severe right upper quadrant pain for 4 days. Cervical cancer. EXAM: ULTRASOUND ABDOMEN LIMITED RIGHT UPPER QUADRANT COMPARISON:  None Available. FINDINGS: Gallbladder: A nonshadowing  echogenic focus along the fundus of the gallbladder measures 6 mm. No hyperemia or ring down artifact is present. Gallbladder wall is of normal thickness at 2 mm. No sonographic Eulah Pont sign is reported. Common bile duct: Diameter: 6.0 mm, upper limits of normal. Liver: No focal lesion identified. Within normal limits in parenchymal echogenicity. Portal vein is patent on color Doppler imaging with normal direction of blood flow towards the liver. Other: None. IMPRESSION: 1. Nonshadowing echogenic focus along the fundus of the gallbladder may represent a nonshadowing stone or polyp. No evidence for acute cholecystitis. 2. Common bile duct is upper limits of normal at 6.0 mm. Electronically Signed   By: Marin Roberts M.D.   On: 04/23/2023 09:45   [place] Intraoperative  Result Date: 04/07/2023 CLINICAL DATA:  Ultrasound was provided for use by the ordering physician.  No provider Interpretation or professional fees incurred.    [place] Intraoperative  Result Date: 03/31/2023 CLINICAL DATA:  Ultrasound was provided for use by the ordering physician.  No provider Interpretation or professional fees incurred.     .. Vitals:   04/26/23 2356 04/27/23 0438 04/27/23 0500 04/27/23 0816  BP: 117/67 106/65  109/72  Pulse: (!) 102 99  94  Resp:  16  16  Temp: 99.3 F (37.4 C) 99.5 F (37.5 C)  99 F (37.2 C)  TempSrc: Oral Oral  Oral  SpO2: 99% 99%  99%  Weight:   152 lb 12.5 oz (69.3 kg)   Height:        Labs:  CBC: Recent Labs    03/31/23 1118 04/22/23 1102 04/26/23 1419 04/27/23 0017  WBC 4.8 8.0 7.3 6.6  HGB 10.7* 9.4* 9.9* 8.7*  HCT 31.8* 27.4* 30.4* 27.0*  PLT 274 202 315 303    COAGS: No results for input(s): "INR", "APTT" in the last 8760 hours.  BMP: Recent Labs    03/31/23 1118 04/22/23 1102 04/26/23 1419 04/27/23 0017  NA 136 132* 133* 135  K  5.1 3.6 3.6 3.1*  CL 105 99 97* 97*  CO2 27 26 25 28   GLUCOSE 140* 109* 103* 92  BUN 20 26* 11 13  CALCIUM 9.4 9.1 8.8* 8.3*   CREATININE 0.85 1.24* 0.89 0.82  GFRNONAA >60 53* >60 >60    LIVER FUNCTION TESTS: Recent Labs    03/31/23 1118 04/22/23 1102 04/26/23 1419 04/27/23 0017  BILITOT 0.2* 0.7 0.9 0.8  AST 10* 10* 15 11*  ALT 5 7 13 11   ALKPHOS 90 148* 221* 188*  PROT 7.1 6.8 7.6 6.8  ALBUMIN 3.8 3.4* 3.0* 2.6*    TUMOR MARKERS: No results for input(s): "AFPTM", "CEA", "CA199", "CHROMGRNA" in the last 8760 hours.  Assessment and Plan:  Lorraine Yates is a 52 y.o. female significant for cervical cancer with current chemotherapy treatment.On 10/15 a ct abdomen and pelvis revealed her appendix is perforated with a small developing abscess and a 5 cm liver abscess at the caudal tip of the right lobe. She is scheduled today for a image guided percutaneous perihepatic drain in IR at the request of Dr. Karie Schwalbe. Luisa Hart. The patient is NPO, WBC WNL, temp of 99,, not receiving anticoagulation and not appearing to be in distress'.  Images have been reviewed and approved by Dr. Carmon Ginsberg. Mir to proceed with procedure in IR.Marland Kitchen   Risks and benefits discussed with the patient including bleeding, infection, damage to adjacent structures, bowel perforation/fistula connection, and sepsis.  All of the patient's questions were answered, patient is agreeable to proceed. Consent signed and in chart.  Thank you for this interesting consult.  I greatly enjoyed meeting Eola LORMA HEATER and look forward to participating in their care.  A copy of this report was sent to the requesting provider on this date.  Electronically Signed: Ardith Dark, NP 04/27/2023, 9:25 AM   I spent a total of 20 Minutes    in face to face in clinical consultation, greater than 50% of which was counseling/coordinating care for image guided percutaneous perihepatic drain placement

## 2023-04-27 NOTE — Progress Notes (Signed)
Lorraine Yates   DOB:1970/11/09   KV#:[id number]    ASSESSMENT & PLAN:  Cervical cancer She has completed chemo and radiation treatment She had received immunotherapy last week and this should not impact on wound healing Continue supportive care  Perforated appendicitis with possible liver abscess Appreciate general surgery consult Continue antibiotics as directed Her pain has improved dramatically  Recent constipation, resolved Monitor closely for risk of recurrent constipation  Multifactorial anemia Likely due to recent infection Monitor closely she is not symptomatic  Electrolyte imbalance, protein calorie malnutrition Likely due to her GI process Replete as needed  Discharge planning Would defer to management from general surgery and interventional radiologist From a treatment perspective, she is not due for treatment until end of the month  All questions were answered. The patient knows to call the clinic with any problems, questions or concerns.   The total time spent in the appointment was 40 minutes encounter with patients including review of chart and various tests results, discussions about plan of care and coordination of care plan  Artis Delay, MD 04/27/2023 8:45 AM  Subjective:  Patient is well-known to me.  She was supposed to get CT imaging next week for evaluation of right upper quadrant pain but due to severe GI symptoms, was directed to the emergency department.  I have reviewed her CT imaging.  She was started on IV antibiotics.  No fever She was having a lot of pain prior to admission but after a large bowel movement, her pain has resolved We discussed findings of CT imaging and plan of care  Objective:  Vitals:   04/27/23 0438 04/27/23 0816  BP: 106/65 109/72  Pulse: 99 94  Resp: 16 16  Temp: 99.5 F (37.5 C) 99 F (37.2 C)  SpO2: 99% 99%     Intake/Output Summary (Last 24 hours) at 04/27/2023 0845 Last data filed at 04/26/2023 2130 Gross per 24  hour  Intake 120 ml  Output --  Net 120 ml    GENERAL:alert, no distress and comfortable NEURO: alert & oriented x 3 with fluent speech, no focal motor/sensory deficits   Labs:  Recent Labs    04/22/23 1102 04/26/23 1419 04/27/23 0017  NA 132* 133* 135  K 3.6 3.6 3.1*  CL 99 97* 97*  CO2 26 25 28   GLUCOSE 109* 103* 92  BUN 26* 11 13  CREATININE 1.24* 0.89 0.82  CALCIUM 9.1 8.8* 8.3*  GFRNONAA 53* >60 >60  PROT 6.8 7.6 6.8  ALBUMIN 3.4* 3.0* 2.6*  AST 10* 15 11*  ALT 7 13 11   ALKPHOS 148* 221* 188*  BILITOT 0.7 0.9 0.8    Studies: I have personally reviewed her CT imaging US Abdomen Limited RUQ (LIVER/GB)  Result Date: 04/26/2023 CLINICAL DATA:  Abdominal pain EXAM: ULTRASOUND ABDOMEN LIMITED RIGHT UPPER QUADRANT COMPARISON:  Same day CT FINDINGS: Gallbladder: Mild gallbladder wall thickening. No gallstones visualized. No sonographic Murphy sign noted by sonographer. Gallbladder polyp measuring 7 mm with thick. Common bile duct: Diameter: 7 mm Liver: Heterogeneous of the inferior right lobe of the liver of the liver with echogenic component and dirty shadowing measuring 5.1 x 3.2 x 4.6 cm. Within normal limits in parenchymal echogenicity. Portal vein is patent on color Doppler imaging with normal direction of blood flow towards the liver. Other: None. IMPRESSION: 1. Mild gallbladder wall thickening without gallstones visualized and negative sonographic Murphy sign. Findings are nonspecific and can be seen in the setting of cholecystitis, liver disease, or fluid  overload states. 2. Heterogeneous lesion of the inferior right lobe of the liver, correlates with liver abscess seen on same day CT. 3. Mildly dilated common bile duct. Correlate with liver function tests. If LFTs are abnormal, consider further evaluation with MRCP. 4. Gallbladder polyp measuring 7 mm. Recommend follow-up gallbladder ultrasound in 12 months. Electronically Signed   By: Allegra Lai M.D.   On: 04/26/2023  19:50   CT ABDOMEN PELVIS W CONTRAST  Result Date: 04/26/2023 CLINICAL DATA:  Abdominal pain, acute, nonlocalized. Right flank pain over the last week. EXAM: CT ABDOMEN AND PELVIS WITH CONTRAST TECHNIQUE: Multidetector CT imaging of the abdomen and pelvis was performed using the standard protocol following bolus administration of intravenous contrast. RADIATION DOSE REDUCTION: This exam was performed according to the departmental dose-optimization program which includes automated exposure control, adjustment of the mA and/or kV according to patient size and/or use of iterative reconstruction technique. CONTRAST:  OMNIPAQUE IOHEXOL 300 MG/ML  SOLN COMPARISON:  Ultrasound 2 days ago. FINDINGS: Lower chest: Mild linear scar or atelectasis at the right lung base. No pleural fluid. Hepatobiliary: No calcified gallstones or CT evidence of cholecystitis. Hepatomegaly. Liver abscess at the caudal tip of the right lobe with an air-fluid level measuring up to 5 cm in diameter. Adjacent appendicitis, presumably the etiology. See below. Pancreas: Normal Spleen: Normal Adrenals/Urinary Tract: Adrenal glands are normal. Kidneys are normal. Bladder is normal. Stomach/Bowel: Stomach and small intestine are normal. As noted above, there is acute appendicitis. The appendix is located in a retrocecal location, extending back up to the caudal tip of the liver. The appendix is perforated in this location and there is some phlegmonous inflammation, possibly with a small developing abscess measuring about 12 mm. This is adjacent and in contact with the liver edge and quite likely the etiology of the liver abscess. Vascular/Lymphatic: Aorta and IVC are normal.  No adenopathy. Reproductive: No pelvic organ pathology. Other: No free fluid or air. Musculoskeletal: Normal IMPRESSION: Acute appendicitis. The appendix is located in a retrocecal location, extending back up to the caudal tip of the liver. The appendix is perforated in  this location and there is some phlegmonous inflammation, possibly with a small developing abscess measuring about 12 mm. This is adjacent and in contact with the liver edge. There is a liver abscess at the caudal tip of the right lobe measuring up to 5 cm in size with a gas fluid level. The appendicitis is quite likely the etiology of the liver abscess. Electronically Signed   By: Paulina Fusi M.D.   On: 04/26/2023 17:41   US Abdomen Limited RUQ (LIVER/GB)  Result Date: 04/23/2023 CLINICAL DATA:  Severe right upper quadrant pain for 4 days. Cervical cancer. EXAM: ULTRASOUND ABDOMEN LIMITED RIGHT UPPER QUADRANT COMPARISON:  None Available. FINDINGS: Gallbladder: A nonshadowing echogenic focus along the fundus of the gallbladder measures 6 mm. No hyperemia or ring down artifact is present. Gallbladder wall is of normal thickness at 2 mm. No sonographic Eulah Pont sign is reported. Common bile duct: Diameter: 6.0 mm, upper limits of normal. Liver: No focal lesion identified. Within normal limits in parenchymal echogenicity. Portal vein is patent on color Doppler imaging with normal direction of blood flow towards the liver. Other: None. IMPRESSION: 1. Nonshadowing echogenic focus along the fundus of the gallbladder may represent a nonshadowing stone or polyp. No evidence for acute cholecystitis. 2. Common bile duct is upper limits of normal at 6.0 mm. Electronically Signed   By: Virl Son.D.  On: 04/23/2023 09:45   [place] Intraoperative  Result Date: 04/07/2023 CLINICAL DATA:  Ultrasound was provided for use by the ordering physician.  No provider Interpretation or professional fees incurred.    [place] Intraoperative  Result Date: 03/31/2023 CLINICAL DATA:  Ultrasound was provided for use by the ordering physician.  No provider Interpretation or professional fees incurred.

## 2023-04-27 NOTE — Progress Notes (Signed)
Progress Note   Patient: Lorraine Yates:[id number] DOB: [birth year] DOA: 04/26/2023     1 DOS: the patient was seen and examined on 04/27/2023   Brief hospital course: 52 y.o. female with medical history significant for cervical cancer managed with chemotherapy and radiation, now presenting with worsening right-sided abdominal pain.   Patient reports developing pain in the right flank 1 week ago.  She had nausea with nonbloody vomiting at the onset, but that has resolved.  She was also experiencing chills at home.  She was evaluated in the outpatient setting and CT was ordered, but with her pain continuing to worsen, she came into the ED.   ED Course: Upon arrival to the ED, patient is found to be afebrile and saturating well on room air with mild tachycardia and stable blood pressure.  Labs are most notable for normal WBC, hemoglobin 9.9, and normal renal function.  CT findings are concerning for acute perforated appendicitis with question of developing abscess and 5 cm abscess at the caudal tip of the right liver lobe.   Surgery (Dr. Luisa Yates) was consulted by the ED  Assessment and Plan: 1. Acute appendicitis  - Continued with Zosyn, -General Surgery consulted, recommended IR drain placement. Drain placed 10/16 -Clear diet OK per General Surgery   2. Liver abscess  - Likely d/t direct spread from adjacent perforated appendix  - Continue treatment with Zosyn -Now s/p IR drain placement   3. Cervical cancer  - Undergoing brachytherapy and chemotherapy under the care of Dr. Bertis Yates     Subjective: Seen prior to IR procedure today. Pt was very keen on resuming diet   Physical Exam: Vitals:   04/27/23 1305 04/27/23 1310 04/27/23 1315 04/27/23 1341  BP: 112/69 109/62 114/71 125/77  Pulse: 93 89 88 87  Resp: 16 14 14 15   Temp:    99.4 F (37.4 C)  TempSrc:    Oral  SpO2: 100% 100% 99% 99%  Weight:      Height:       General exam: Awake, laying in bed, in nad Respiratory  system: Normal respiratory effort, no wheezing Cardiovascular system: regular rate, s1, s2 Gastrointestinal system: Soft, nondistended, positive BS Central nervous system: CN2-12 grossly intact, strength intact Extremities: Perfused, no clubbing Skin: Normal skin turgor, no notable skin lesions seen Psychiatry: Mood normal // no visual hallucinations   Data Reviewed:  Labs reviewed: Na 135, K 3.1, Cr 0.82, WBC 6.6, Hgb 8.7  Family Communication: Pt in room, family not at bedside  Disposition: Status is: Inpatient Remains inpatient appropriate because: severity of illness  Planned Discharge Destination: Home    Author: Rickey Barbara, MD 04/27/2023 5:41 PM  For on call review [email or web address].

## 2023-04-28 DIAGNOSIS — K3532 Acute appendicitis with perforation and localized peritonitis, without abscess: Secondary | ICD-10-CM | POA: Diagnosis not present

## 2023-04-28 DIAGNOSIS — K35211 Acute appendicitis with generalized peritonitis, with perforation and abscess: Secondary | ICD-10-CM | POA: Diagnosis not present

## 2023-04-28 DIAGNOSIS — K750 Abscess of liver: Secondary | ICD-10-CM | POA: Diagnosis not present

## 2023-04-28 LAB — COMPREHENSIVE METABOLIC PANEL
ALT: 9 U/L (ref 0–44)
AST: 11 U/L — ABNORMAL LOW (ref 15–41)
Albumin: 2.4 g/dL — ABNORMAL LOW (ref 3.5–5.0)
Alkaline Phosphatase: 163 U/L — ABNORMAL HIGH (ref 38–126)
Anion gap: 10 (ref 5–15)
BUN: 10 mg/dL (ref 6–20)
CO2: 27 mmol/L (ref 22–32)
Calcium: 8.1 mg/dL — ABNORMAL LOW (ref 8.9–10.3)
Chloride: 93 mmol/L — ABNORMAL LOW (ref 98–111)
Creatinine, Ser: 0.84 mg/dL (ref 0.44–1.00)
GFR, Estimated: >60 mL/min (ref >60)
Glucose, Bld: 96 mg/dL (ref 70–99)
Potassium: 3.4 mmol/L — ABNORMAL LOW (ref 3.5–5.1)
Sodium: 130 mmol/L — ABNORMAL LOW (ref 135–145)
Total Bilirubin: 0.6 mg/dL (ref 0.3–1.2)
Total Protein: 6.4 g/dL — ABNORMAL LOW (ref 6.5–8.1)

## 2023-04-28 LAB — CBC
HCT: 24.7 % — ABNORMAL LOW (ref 36.0–46.0)
Hemoglobin: 8 g/dL — ABNORMAL LOW (ref 12.0–15.0)
MCH: 32.5 pg (ref 26.0–34.0)
MCHC: 32.4 g/dL (ref 30.0–36.0)
MCV: 100.4 fL — ABNORMAL HIGH (ref 80.0–100.0)
Platelets: 316 10*3/uL (ref 150–400)
RBC: 2.46 MIL/uL — ABNORMAL LOW (ref 3.87–5.11)
RDW: 18.6 % — ABNORMAL HIGH (ref 11.5–15.5)
WBC: 4.6 10*3/uL (ref 4.0–10.5)
nRBC: 0 % (ref 0.0–0.2)

## 2023-04-28 MED ORDER — SENNOSIDES-DOCUSATE SODIUM 8.6-50 MG PO TABS
1.0000 | ORAL_TABLET | Freq: Every day | ORAL | Status: DC
  Filled 2023-04-28 (×2): qty 1

## 2023-04-28 MED ORDER — SODIUM CHLORIDE 0.9% FLUSH
5.0000 mL | Freq: Three times a day (TID) | INTRAVENOUS | Status: DC
  Administered 2023-04-28 – 2023-04-29 (×5): 5 mL

## 2023-04-28 MED ORDER — POLYETHYLENE GLYCOL 3350 17 G PO PACK
17.0000 g | PACK | Freq: Every day | ORAL | Status: DC | PRN
  Filled 2023-04-28: qty 1

## 2023-04-28 NOTE — Plan of Care (Signed)

## 2023-04-28 NOTE — Progress Notes (Signed)
Progress Note   Patient: Lorraine Yates AVW:[id number] DOB: [birth year] DOA: 04/26/2023     2 DOS: the patient was seen and examined on 04/28/2023   Brief hospital course: 52 y.o. female with medical history significant for cervical cancer managed with chemotherapy and radiation, now presenting with worsening right-sided abdominal pain.   Patient reports developing pain in the right flank 1 week ago.  She had nausea with nonbloody vomiting at the onset, but that has resolved.  She was also experiencing chills at home.  She was evaluated in the outpatient setting and CT was ordered, but with her pain continuing to worsen, she came into the ED.   ED Course: Upon arrival to the ED, patient is found to be afebrile and saturating well on room air with mild tachycardia and stable blood pressure.  Labs are most notable for normal WBC, hemoglobin 9.9, and normal renal function.  CT findings are concerning for acute perforated appendicitis with question of developing abscess and 5 cm abscess at the caudal tip of the right liver lobe.   Surgery (Dr. Luisa Hart) was consulted by the ED  Assessment and Plan: 1. Acute appendicitis  - Continued with Zosyn for now -General Surgery consulted, recommended IR drain placement. Drain placed 10/16 -Diet advanced by General Surgery, Per Surgery, if pt continues to tolerate PO well, can transition to augmentin x 4 weeks -Surgery plans to continue drain for the next several weeks -Would need colonoscopy prior to surgery   2. Liver abscess  - Likely d/t direct spread from adjacent perforated appendix  - Continue treatment with Zosyn -Now s/p IR drain placement. Pt to follow up with IR as outpatient   3. Cervical cancer  - Undergoing brachytherapy and chemotherapy under the care of Dr. Bertis Ruddy     Subjective: States feeling better today  Physical Exam: Vitals:   04/27/23 2031 04/28/23 0447 04/28/23 0452 04/28/23 1355  BP: 124/75  136/88 131/80  Pulse: 95  80 83   Resp: 18  18 17   Temp: 99.3 F (37.4 C)  98.4 F (36.9 C) 98.9 F (37.2 C)  TempSrc:   Oral   SpO2: 100%  100% 99%  Weight:  68.5 kg    Height:       General exam: Conversant, in no acute distress Respiratory system: normal chest rise, clear, no audible wheezing Cardiovascular system: regular rhythm, s1-s2 Gastrointestinal system: Nondistended, nontender, pos BS Central nervous system: No seizures, no tremors Extremities: No cyanosis, no joint deformities Skin: No rashes, no pallor Psychiatry: Affect normal // no auditory hallucinations   Data Reviewed:  Labs reviewed: Na 130, K 3.4, Cr 0.84, WBC 4.6, Hgb 8.0, Plts 316  Family Communication: Pt in room, family not at bedside  Disposition: Status is: Inpatient Remains inpatient appropriate because: severity of illness  Planned Discharge Destination: Home    Author: Rickey Barbara, MD 04/28/2023 4:44 PM  For on call review [email or web address].

## 2023-04-28 NOTE — Progress Notes (Signed)
   04/28/23 1053  TOC Brief Assessment  Insurance and Status Reviewed  Patient has primary care physician No (Pt to schedule PCP appointment)  Home environment has been reviewed Single family home  Prior level of function: Independent  Prior/Current Home Services No current home services  Social Determinants of Health Reivew SDOH reviewed no interventions necessary  Readmission risk has been reviewed Yes  Transition of care needs no transition of care needs at this time

## 2023-04-28 NOTE — Progress Notes (Addendum)
Progress Note     Subjective: Abdominal pain continues to improve. Tolerated clears without n/v/abdominal pain. Had another BM   Objective: Vital signs in last 24 hours: Temp:  [98.4 F (36.9 C)-99.4 F (37.4 C)] 98.4 F (36.9 C) (10/17 0452) Pulse Rate:  [80-95] 80 (10/17 0452) Resp:  [14-18] 18 (10/17 0452) BP: (109-136)/(62-90) 136/88 (10/17 0452) SpO2:  [99 %-100 %] 100 % (10/17 0452) Weight:  [68.5 kg] 68.5 kg (10/17 0447) Last BM Date : 04/25/23  Intake/Output from previous day: 10/16 0701 - 10/17 0700 In: 1541.5 [P.O.:240; I.V.:1154.8; IV Piggyback:136.7] Out: 55 [Drains:55] Intake/Output this shift: No intake/output data recorded.  PE: General: pleasant, WD, female who is laying in bed in NAD Lungs:Respiratory effort nonlabored Abd: soft, ND, mild TTP RUQ and right mid abdomen. Drain in place with purulent output MSK: all 4 extremities are symmetrical with no cyanosis, clubbing, or edema. Skin: warm and dry  Psych: A&Ox3 with an appropriate affect.    Lab Results:  Recent Labs    04/27/23 0017 04/28/23 0215  WBC 6.6 4.6  HGB 8.7* 8.0*  HCT 27.0* 24.7*  PLT 303 316   BMET Recent Labs    04/27/23 0017 04/28/23 0215  NA 135 130*  K 3.1* 3.4*  CL 97* 93*  CO2 28 27  GLUCOSE 92 96  BUN 13 10  CREATININE 0.82 0.84  CALCIUM 8.3* 8.1*   PT/INR No results for input(s): "LABPROT", "INR" in the last 72 hours. CMP     Component Value Date/Time   NA 130 (L) 04/28/2023 0215   K 3.4 (L) 04/28/2023 0215   CL 93 (L) 04/28/2023 0215   CO2 27 04/28/2023 0215   GLUCOSE 96 04/28/2023 0215   BUN 10 04/28/2023 0215   CREATININE 0.84 04/28/2023 0215   CREATININE 1.24 (H) 04/22/2023 1102   CALCIUM 8.1 (L) 04/28/2023 0215   PROT 6.4 (L) 04/28/2023 0215   ALBUMIN 2.4 (L) 04/28/2023 0215   AST 11 (L) 04/28/2023 0215   AST 10 (L) 04/22/2023 1102   ALT 9 04/28/2023 0215   ALT 7 04/22/2023 1102   ALKPHOS 163 (H) 04/28/2023 0215   BILITOT 0.6 04/28/2023  0215   BILITOT 0.7 04/22/2023 1102   GFRNONAA >60 04/28/2023 0215   GFRNONAA 53 (L) 04/22/2023 1102   Lipase  No results found for: "LIPASE"     Studies/Results: CT GUIDED PERITONEAL/RETROPERITONEAL FLUID DRAIN BY PERC CATH  Result Date: 04/27/2023 INDICATION: Perihepatic abscess EXAM: CT-guided abscess drain placement TECHNIQUE: Multidetector CT imaging of the abdomen was performed following the standard protocol without IV contrast. RADIATION DOSE REDUCTION: This exam was performed according to the departmental dose-optimization program which includes automated exposure control, adjustment of the mA and/or kV according to patient size and/or use of iterative reconstruction technique. MEDICATIONS: The patient is currently admitted to the hospital and receiving intravenous antibiotics. The antibiotics were administered within an appropriate time frame prior to the initiation of the procedure. ANESTHESIA/SEDATION: Moderate (conscious) sedation was employed during this procedure. A total of Versed 4 mg and Fentanyl 200 mcg was administered intravenously by the radiology nurse. Total intra-service moderate Sedation Time: 20 minutes. The patient's level of consciousness and vital signs were monitored continuously by radiology nursing throughout the procedure under my direct supervision. COMPLICATIONS: None immediate. PROCEDURE: Informed written consent was obtained from the patient after a thorough discussion of the procedural risks, benefits and alternatives. All questions were addressed. Maximal Sterile Barrier Technique was utilized including caps, mask, sterile gowns,  sterile gloves, sterile drape, hand hygiene and skin antiseptic. A timeout was performed prior to the initiation of the procedure. Patient positioned left lateral decubitus on the procedure table. Right lateral abdominal skin prepped and draped in usual fashion. Following local administration, the perihepatic abscess was accessed with 19  gauge Yueh needle utilizing CT guidance. The Yueh catheter was exchanged for a 10.2 [place] multipurpose pigtail drain over 0.035 inch guidewire. Post placement CT confirmed appropriate positioning of the drain. 30 mL of purulent materia l was aspirated. Samples were sent for Gram stain and culture. Drain secured to skin with suture and connected to bulb. IMPRESSION: Successful CT-guided perihepatic abscess drain placement (10.2 [place]). Electronically Signed   By: Acquanetta Belling M.D.   On: 04/27/2023 16:24   US Abdomen Limited RUQ (LIVER/GB)  Result Date: 04/26/2023 CLINICAL DATA:  Abdominal pain EXAM: ULTRASOUND ABDOMEN LIMITED RIGHT UPPER QUADRANT COMPARISON:  Same day CT FINDINGS: Gallbladder: Mild gallbladder wall thickening. No gallstones visualized. No sonographic Murphy sign noted by sonographer. Gallbladder polyp measuring 7 mm with thick. Common bile duct: Diameter: 7 mm Liver: Heterogeneous of the inferior right lobe of the liver of the liver with echogenic component and dirty shadowing measuring 5.1 x 3.2 x 4.6 cm. Within normal limits in parenchymal echogenicity. Portal vein is patent on color Doppler imaging with normal direction of blood flow towards the liver. Other: None. IMPRESSION: 1. Mild gallbladder wall thickening without gallstones visualized and negative sonographic Murphy sign. Findings are nonspecific and can be seen in the setting of cholecystitis, liver disease, or fluid overload states. 2. Heterogeneous lesion of the inferior right lobe of the liver, correlates with liver abscess seen on same day CT. 3. Mildly dilated common bile duct. Correlate with liver function tests. If LFTs are abnormal, consider further evaluation with MRCP. 4. Gallbladder polyp measuring 7 mm. Recommend follow-up gallbladder ultrasound in 12 months. Electronically Signed   By: Allegra Lai M.D.   On: 04/26/2023 19:50   CT ABDOMEN PELVIS W CONTRAST  Result Date: 04/26/2023 CLINICAL DATA:  Abdominal  pain, acute, nonlocalized. Right flank pain over the last week. EXAM: CT ABDOMEN AND PELVIS WITH CONTRAST TECHNIQUE: Multidetector CT imaging of the abdomen and pelvis was performed using the standard protocol following bolus administration of intravenous contrast. RADIATION DOSE REDUCTION: This exam was performed according to the departmental dose-optimization program which includes automated exposure control, adjustment of the mA and/or kV according to patient size and/or use of iterative reconstruction technique. CONTRAST:  OMNIPAQUE IOHEXOL 300 MG/ML  SOLN COMPARISON:  Ultrasound 2 days ago. FINDINGS: Lower chest: Mild linear scar or atelectasis at the right lung base. No pleural fluid. Hepatobiliary: No calcified gallstones or CT evidence of cholecystitis. Hepatomegaly. Liver abscess at the caudal tip of the right lobe with an air-fluid level measuring up to 5 cm in diameter. Adjacent appendicitis, presumably the etiology. See below. Pancreas: Normal Spleen: Normal Adrenals/Urinary Tract: Adrenal glands are normal. Kidneys are normal. Bladder is normal. Stomach/Bowel: Stomach and small intestine are normal. As noted above, there is acute appendicitis. The appendix is located in a retrocecal location, extending back up to the caudal tip of the liver. The appendix is perforated in this location and there is some phlegmonous inflammation, possibly with a small developing abscess measuring about 12 mm. This is adjacent and in contact with the liver edge and quite likely the etiology of the liver abscess. Vascular/Lymphatic: Aorta and IVC are normal.  No adenopathy. Reproductive: No pelvic organ pathology. Other: No  free fluid or air. Musculoskeletal: Normal IMPRESSION: Acute appendicitis. The appendix is located in a retrocecal location, extending back up to the caudal tip of the liver. The appendix is perforated in this location and there is some phlegmonous inflammation, possibly with a small developing  abscess measuring about 12 mm. This is adjacent and in contact with the liver edge. There is a liver abscess at the caudal tip of the right lobe measuring up to 5 cm in size with a gas fluid level. The appendicitis is quite likely the etiology of the liver abscess. Electronically Signed   By: Paulina Fusi M.D.   On: 04/26/2023 17:41    Anti-infectives: Anti-infectives (From admission, onward)    Start     Dose/Rate Route Frequency Ordered Stop   04/26/23 2200  piperacillin-tazobactam (ZOSYN) IVPB 3.375 g        3.375 g 12.5 mL/hr over 240 Minutes Intravenous Every 8 hours 04/26/23 1908     04/26/23 1600  piperacillin-tazobactam (ZOSYN) IVPB 3.375 g        3.375 g 100 mL/hr over 30 Minutes Intravenous  Once 04/26/23 1554 04/26/23 1633        Assessment/Plan Perforated appendicitis with liver abscess   - s/p IR drain placement 10/16. Cx pending - cont IV abx - tolerating CLD. FLD and adat soft - she will likely benefit from interval appendectomy in approx 6 weeks pending improvement from acute infection. Recommend colonoscopy prior and I have placed an urgent referral - recommend ongoing bowel regimen for constipation prevention  FEN: FLD ADAT soft ID: [id number] for chemical prophylaxis from surgical standpoint  Per primary Cervical cancer s/p chemo and radiation. On immunotherapy anemia  I reviewed Consultant Oncology notes, hospitalist notes, last 24 h vitals and pain scores, last 48 h intake and output, last 24 h labs and trends, and last 24 h imaging results.    LOS: 2 days   Eric Form, [facility] 04/28/2023, 8:06 AM Please see Amion for pager number during day hours 7:00am-4:30pm

## 2023-04-28 NOTE — Progress Notes (Signed)
Referring Physician(s): Gross,S  Supervising Physician: Marliss Coots  Patient Status:  [facility] - In-pt  Chief Complaint:  Perihepatic abscess/perforated appendicitis  Subjective: Pt states that she feels better since perihepatic drain placed yesterday; afebrile; denies N/V; has some soreness at drain insertion site   Allergies: Demerol [meperidine hcl] and Hydromorphone  Medications: Prior to Admission medications   Medication Sig Start Date End Date Taking? Authorizing Provider  aluminum-magnesium hydroxide 200-200 MG/5ML suspension Take 30 mLs by mouth as needed (constipation).   Yes [provider]  bisacodyl (DULCOLAX) 5 MG EC tablet Take 15 mg by mouth daily as needed for moderate constipation.   Yes [provider]  lidocaine-prilocaine (EMLA) cream Apply 1 Application topically as needed. Apply to port site 1 hour to 30 minutes before port is accessed 12/22/22  Yes Gorsuch, Ni, MD  ondansetron (ZOFRAN) 8 MG tablet Take 8 mg by mouth every 8 (eight) hours as needed for nausea or vomiting.   Yes [provider]  oxyCODONE (OXY IR/ROXICODONE) 5 MG immediate release tablet Take 1 tablet (5 mg) by mouth every 4 hours as needed for severe pain. Patient taking differently: Take 15 mg by mouth every 4 (four) hours as needed for severe pain (pain score 7-10). 04/20/23  Yes Gorsuch, Ni, MD  prochlorperazine (COMPAZINE) 10 MG tablet TAKE 1 TABLET(10 MG) BY MOUTH EVERY 6 HOURS AS NEEDED FOR NAUSEA OR VOMITING 01/21/23  Yes Gorsuch, Ni, MD  magnesium oxide (MAG-OX) 400 (240 Mg) MG tablet Take 400 mg by mouth daily. Patient not taking: Reported on 04/28/2023    [provider]     Vital Signs: BP 136/88 (BP Location: Left Arm)   Pulse 80   Temp 98.4 F (36.9 C) (Oral)   Resp 18   Ht 5\' 4"  (1.626 m)   Wt 151 lb 0.2 oz (68.5 kg)   SpO2 100%   BMI 25.92 kg/m   Physical Exam: awake/alert; RUQ drain intact, dressing clean and dry, mildly tender to  palpation; output 55 cc thick beige colored fluid in JP bulb; drain flushed without difficulty  Imaging: CT GUIDED PERITONEAL/RETROPERITONEAL FLUID DRAIN BY PERC CATH  Result Date: 04/27/2023 INDICATION: Perihepatic abscess EXAM: CT-guided abscess drain placement TECHNIQUE: Multidetector CT imaging of the abdomen was performed following the standard protocol without IV contrast. RADIATION DOSE REDUCTION: This exam was performed according to the departmental dose-optimization program which includes automated exposure control, adjustment of the mA and/or kV according to patient size and/or use of iterative reconstruction technique. MEDICATIONS: The patient is currently admitted to the hospital and receiving intravenous antibiotics. The antibiotics were administered within an appropriate time frame prior to the initiation of the procedure. ANESTHESIA/SEDATION: Moderate (conscious) sedation was employed during this procedure. A total of Versed 4 mg and Fentanyl 200 mcg was administered intravenously by the radiology nurse. Total intra-service moderate Sedation Time: 20 minutes. The patient's level of consciousness and vital signs were monitored continuously by radiology nursing throughout the procedure under my direct supervision. COMPLICATIONS: None immediate. PROCEDURE: Informed written consent was obtained from the patient after a thorough discussion of the procedural risks, benefits and alternatives. All questions were addressed. Maximal Sterile Barrier Technique was utilized including caps, mask, sterile gowns, sterile gloves, sterile drape, hand hygiene and skin antiseptic. A timeout was performed prior to the initiation of the procedure. Patient positioned left lateral decubitus on the procedure table. Right lateral abdominal skin prepped and draped in usual fashion. Following local administration, the perihepatic abscess was accessed  with 19 gauge Yueh needle utilizing CT guidance. The Yueh catheter was  exchanged for a 10.2 [place] multipurpose pigtail drain over 0.035 inch guidewire. Post placement CT confirmed appropriate positioning of the drain. 30 mL of purulent materia l was aspirated. Samples were sent for Gram stain and culture. Drain secured to skin with suture and connected to bulb. IMPRESSION: Successful CT-guided perihepatic abscess drain placement (10.2 [place]). Electronically Signed   By: Acquanetta Belling M.D.   On: 04/27/2023 16:24   US Abdomen Limited RUQ (LIVER/GB)  Result Date: 04/26/2023 CLINICAL DATA:  Abdominal pain EXAM: ULTRASOUND ABDOMEN LIMITED RIGHT UPPER QUADRANT COMPARISON:  Same day CT FINDINGS: Gallbladder: Mild gallbladder wall thickening. No gallstones visualized. No sonographic Murphy sign noted by sonographer. Gallbladder polyp measuring 7 mm with thick. Common bile duct: Diameter: 7 mm Liver: Heterogeneous of the inferior right lobe of the liver of the liver with echogenic component and dirty shadowing measuring 5.1 x 3.2 x 4.6 cm. Within normal limits in parenchymal echogenicity. Portal vein is patent on color Doppler imaging with normal direction of blood flow towards the liver. Other: None. IMPRESSION: 1. Mild gallbladder wall thickening without gallstones visualized and negative sonographic Murphy sign. Findings are nonspecific and can be seen in the setting of cholecystitis, liver disease, or fluid overload states. 2. Heterogeneous lesion of the inferior right lobe of the liver, correlates with liver abscess seen on same day CT. 3. Mildly dilated common bile duct. Correlate with liver function tests. If LFTs are abnormal, consider further evaluation with MRCP. 4. Gallbladder polyp measuring 7 mm. Recommend follow-up gallbladder ultrasound in 12 months. Electronically Signed   By: Allegra Lai M.D.   On: 04/26/2023 19:50   CT ABDOMEN PELVIS W CONTRAST  Result Date: 04/26/2023 CLINICAL DATA:  Abdominal pain, acute, nonlocalized. Right flank pain over the last week.  EXAM: CT ABDOMEN AND PELVIS WITH CONTRAST TECHNIQUE: Multidetector CT imaging of the abdomen and pelvis was performed using the standard protocol following bolus administration of intravenous contrast. RADIATION DOSE REDUCTION: This exam was performed according to the departmental dose-optimization program which includes automated exposure control, adjustment of the mA and/or kV according to patient size and/or use of iterative reconstruction technique. CONTRAST:  OMNIPAQUE IOHEXOL 300 MG/ML  SOLN COMPARISON:  Ultrasound 2 days ago. FINDINGS: Lower chest: Mild linear scar or atelectasis at the right lung base. No pleural fluid. Hepatobiliary: No calcified gallstones or CT evidence of cholecystitis. Hepatomegaly. Liver abscess at the caudal tip of the right lobe with an air-fluid level measuring up to 5 cm in diameter. Adjacent appendicitis, presumably the etiology. See below. Pancreas: Normal Spleen: Normal Adrenals/Urinary Tract: Adrenal glands are normal. Kidneys are normal. Bladder is normal. Stomach/Bowel: Stomach and small intestine are normal. As noted above, there is acute appendicitis. The appendix is located in a retrocecal location, extending back up to the caudal tip of the liver. The appendix is perforated in this location and there is some phlegmonous inflammation, possibly with a small developing abscess measuring about 12 mm. This is adjacent and in contact with the liver edge and quite likely the etiology of the liver abscess. Vascular/Lymphatic: Aorta and IVC are normal.  No adenopathy. Reproductive: No pelvic organ pathology. Other: No free fluid or air. Musculoskeletal: Normal IMPRESSION: Acute appendicitis. The appendix is located in a retrocecal location, extending back up to the caudal tip of the liver. The appendix is perforated in this location and there is some phlegmonous inflammation, possibly with a small developing abscess measuring  about 12 mm. This is adjacent and in contact with  the liver edge. There is a liver abscess at the caudal tip of the right lobe measuring up to 5 cm in size with a gas fluid level. The appendicitis is quite likely the etiology of the liver abscess. Electronically Signed   By: Paulina Fusi M.D.   On: 04/26/2023 17:41    Labs:  CBC: Recent Labs    04/22/23 1102 04/26/23 1419 04/27/23 0017 04/28/23 0215  WBC 8.0 7.3 6.6 4.6  HGB 9.4* 9.9* 8.7* 8.0*  HCT 27.4* 30.4* 27.0* 24.7*  PLT 202 315 303 316    COAGS: No results for input(s): "INR", "APTT" in the last 8760 hours.  BMP: Recent Labs    04/22/23 1102 04/26/23 1419 04/27/23 0017 04/28/23 0215  NA 132* 133* 135 130*  K 3.6 3.6 3.1* 3.4*  CL 99 97* 97* 93*  CO2 26 25 28 27   GLUCOSE 109* 103* 92 96  BUN 26* 11 13 10   CALCIUM 9.1 8.8* 8.3* 8.1*  CREATININE 1.24* 0.89 0.82 0.84  GFRNONAA 53* >60 >60 >60    LIVER FUNCTION TESTS: Recent Labs    04/22/23 1102 04/26/23 1419 04/27/23 0017 04/28/23 0215  BILITOT 0.7 0.9 0.8 0.6  AST 10* 15 11* 11*  ALT 7 13 11 9   ALKPHOS 148* 221* 188* 163*  PROT 6.8 7.6 6.8 6.4*  ALBUMIN 3.4* 3.0* 2.6* 2.4*    Assessment and Plan: Pt with hx perforated appendicitis with assoc perihepatic abscess; s/p perihepatic drain placement 10/16; afebrile; WBC nl, hgb 8(8.7), drain fl cx pend(gm neg rods)  Drain Location: RUQ Size: Fr size: 10 Fr Date of placement: 04/27/23  Currently to: Drain collection device: suction bulb 24 hour output:  Output by Drain (mL) 04/26/23 0701 - 04/26/23 1900 04/26/23 1901 - 04/27/23 0700 04/27/23 0701 - 04/27/23 1900 04/27/23 1901 - 04/28/23 0700 04/28/23 0701 - 04/28/23 1400  Closed System Drain 1 Lateral RLQ Bulb (JP) 10 Fr.   30 25       Current examination: Flushes/aspirates easily.  Insertion site unremarkable. Suture and stat lock in place. Dressed appropriately.   Plan: Continue TID flushes with 5 cc NS. Record output Q shift. Dressing changes QD or PRN if soiled.  Call IR APP or on call  IR MD if difficulty flushing or sudden change in drain output.  Repeat imaging/possible drain injection once output < 10 mL/QD (excluding flush material). Consideration for drain removal if output is < 10 mL/QD (excluding flush material), pending discussion with the providing surgical service.  Discharge planning: Please contact IR APP or on call IR MD prior to patient d/c to ensure appropriate follow up plans are in place. Typically patient will follow up with IR clinic 10-14 days post d/c for repeat imaging/possible drain injection. IR scheduler will contact patient with date/time of appointment. Patient will need to flush drain QD with 5 cc NS, record output QD, dressing changes every 2-3 days or earlier if soiled.   IR will continue to follow - please call with questions or concerns.      Electronically Signed: D. Jeananne Rama, PA-C 04/28/2023, 1:54 PM   I spent a total of 15 minutes at the the patient's bedside AND on the patient's hospital floor or unit, greater than 50% of which was counseling/coordinating care for perihepatic abscess drain    Patient ID: Lorraine Yates, female   DOB: [birth year], 51 y.o.   MRN: [id number]

## 2023-04-29 ENCOUNTER — Other Ambulatory Visit (HOSPITAL_COMMUNITY): Payer: Self-pay

## 2023-04-29 ENCOUNTER — Telehealth: Payer: Self-pay | Admitting: Nurse Practitioner

## 2023-04-29 DIAGNOSIS — K35211 Acute appendicitis with generalized peritonitis, with perforation and abscess: Secondary | ICD-10-CM | POA: Diagnosis not present

## 2023-04-29 DIAGNOSIS — K3532 Acute appendicitis with perforation and localized peritonitis, without abscess: Secondary | ICD-10-CM | POA: Diagnosis not present

## 2023-04-29 LAB — COMPREHENSIVE METABOLIC PANEL
ALT: 10 U/L (ref 0–44)
AST: 10 U/L — ABNORMAL LOW (ref 15–41)
Albumin: 2.6 g/dL — ABNORMAL LOW (ref 3.5–5.0)
Alkaline Phosphatase: 154 U/L — ABNORMAL HIGH (ref 38–126)
Anion gap: 9 (ref 5–15)
BUN: 9 mg/dL (ref 6–20)
CO2: 27 mmol/L (ref 22–32)
Calcium: 8.3 mg/dL — ABNORMAL LOW (ref 8.9–10.3)
Chloride: 101 mmol/L (ref 98–111)
Creatinine, Ser: 0.91 mg/dL (ref 0.44–1.00)
GFR, Estimated: >60 mL/min (ref >60)
Glucose, Bld: 95 mg/dL (ref 70–99)
Potassium: 3.2 mmol/L — ABNORMAL LOW (ref 3.5–5.1)
Sodium: 137 mmol/L (ref 135–145)
Total Bilirubin: 0.7 mg/dL (ref 0.3–1.2)
Total Protein: 6.4 g/dL — ABNORMAL LOW (ref 6.5–8.1)

## 2023-04-29 LAB — CBC
HCT: 26.8 % — ABNORMAL LOW (ref 36.0–46.0)
Hemoglobin: 8.4 g/dL — ABNORMAL LOW (ref 12.0–15.0)
MCH: 31.8 pg (ref 26.0–34.0)
MCHC: 31.3 g/dL (ref 30.0–36.0)
MCV: 101.5 fL — ABNORMAL HIGH (ref 80.0–100.0)
Platelets: 380 10*3/uL (ref 150–400)
RBC: 2.64 MIL/uL — ABNORMAL LOW (ref 3.87–5.11)
RDW: 18.2 % — ABNORMAL HIGH (ref 11.5–15.5)
WBC: 3.1 10*3/uL — ABNORMAL LOW (ref 4.0–10.5)
nRBC: 0 % (ref 0.0–0.2)

## 2023-04-29 MED ORDER — HEPARIN SOD (PORK) LOCK FLUSH 100 UNIT/ML IV SOLN
500.0000 [IU] | INTRAVENOUS | Status: AC | PRN
  Administered 2023-04-29: 500 [IU]
  Filled 2023-04-29: qty 5

## 2023-04-29 MED ORDER — POTASSIUM CHLORIDE CRYS ER 20 MEQ PO TBCR
40.0000 meq | EXTENDED_RELEASE_TABLET | ORAL | Status: AC
  Administered 2023-04-29 (×2): 40 meq via ORAL
  Filled 2023-04-29 (×2): qty 2

## 2023-04-29 MED ORDER — AMOXICILLIN-POT CLAVULANATE 875-125 MG PO TABS: 1.0000 | ORAL_TABLET | Freq: Two times a day (BID) | ORAL | 0 refills | Status: DC

## 2023-04-29 MED ORDER — AMOXICILLIN-POT CLAVULANATE 875-125 MG PO TABS
1.0000 | ORAL_TABLET | Freq: Two times a day (BID) | ORAL | 0 refills | Status: AC
  Filled 2023-04-29: qty 56, 28d supply, fill #0

## 2023-04-29 MED ORDER — POTASSIUM CHLORIDE 20 MEQ PO PACK
40.0000 meq | PACK | Freq: Once | ORAL | Status: AC
  Administered 2023-04-29: 40 meq via ORAL
  Filled 2023-04-29: qty 2

## 2023-04-29 NOTE — Discharge Instructions (Signed)
Interventional Radiology Percutaneous Abscess Drain Placement After Care   This sheet gives you information about how to care for yourself after your procedure. Your health care provider may also give you more specific instructions. Your drain was placed by an interventional radiologist with [facility]. If you have questions or concerns, contact [facility] at [phone].   What is a percutaneous drain?   A drain is a small plastic tube (catheter) that goes into the fluid collection in your body through your skin.   How long will I need the drain?   How long the drain needs to stay in is determined by where the drain is, how much comes out of the drain each day and if you are having any other surgical procedures.   Interventional radiology will determine when it is time to remove the drain. It is important to follow up as directed so that the drain can be removed as soon as it is safe to do so.   What can I expect after the procedure?   After the procedure, it is common to have:   A small amount of bruising and discomfort in the area where the drainage tube (catheter) was placed.   Sleepiness and fatigue. This should go away after the medicines you were given have worn off.   Follow these instructions at home:   Insertion site care   Check your insertion site when you change the bandage. Check for:   More redness, swelling, or pain.   More fluid or blood.   Warmth.   Pus or a bad smell.   When caring for your insertion site:   Wash your hands with soap and water for at least 20 seconds before and after you change your bandage (dressing). If soap and water are not available, use hand sanitizer.   You do not need to change your dressing everyday if it is clean and dry. Change your dressing every 3 days or as needed when it is soiled, wet or becoming dislodged. You will need to change your dressing each time you shower.   Leave stitches (sutures), skin  glue, or adhesive strips in place. These skin closures may need to stay in place for 2 weeks or longer. If adhesive strip edges start to loosen and curl up, you may trim the loose edges. Do not remove adhesive strips completely unless your health care provider tells you to do so.    Catheter care   Flush the catheter once per day with 5 mL of 0.9% normal saline unless you are told otherwise by your healthcare provider. This helps to prevent clogs in the catheter.   To disconnect the drain, turn the clear plastic tube to the left. Attach the saline syringe by placing it on the white end of the drain and turning gently to the right. Once attached gently push the plunger to the 5 mL mark. After you are done flushing, disconnect the syringe by turning to the left and reattach your drainage container    If you have a bulb please be sure the bulb is charged after reconnecting it - to do this pinch the bulb between your thumb and first finger and close the stopper located on the top of the bulb.     Check for fluid leaking from around your catheter (instead of fluid draining through your catheter). This may be a sign that the drain is no longer working correctly.   Write down the following information every time  you empty your bag:   The date and time.   The amount of drainage.   Activity   Rest at home for 1-2 days after your procedure.   For the first 48 hours do not lift anything more than 10 lbs (about a gallon of milk). You may perform moderate activities/exercise. Please avoid strenuous activities during this time.   Avoid any activities which may pull on your drain as this can cause your drain to become dislodged.   If you were given a sedative during the procedure, it can affect you for several hours. Do not drive or operate machinery until your health care provider says that it is safe.   General instructions   For mild pain take over-the-counter medications as needed for pain such  as Tylenol or Advil. If you are experiencing severe pain please call our office as this may indicate an issue with your drain.    If you were prescribed an antibiotic medicine, take it as told by your health care provider. Do not stop using the antibiotic even if you start to feel better.   You may shower 24 hours after the drain is placed. To do this cover the insertion site with a water tight material such as saran wrap and seal the edges with tape, you may also purchase waterproof dressings at your local drug store. Shower as usual and then remove the water tight dressing and any gauze/tape underneath it once you have exited the shower and dried off. Allow the area to air dry or pat dry with a clean towel. Once the skin is completely dry place a new gauze dressing. It is important to keep the site dry at all times to prevent infection.   Do not submerge the drain - this means you cannot take baths, swim, use a hot tub, etc. until the drain is removed.    Do not use any products that contain nicotine or tobacco, such as cigarettes, e-cigarettes, and chewing tobacco. If you need help quitting, ask your health care provider.   Keep all follow-up visits as told by your health care provider. This is important.   Contact a health care provider if:   You have less than 10 mL of drainage a day for 2-3 days in a row, or as directed by your health care provider.   You have any of these signs of infection:   More redness, swelling, or pain around your incision area.   More fluid or blood coming from your incision area.   Warmth coming from your incision area.   Pus or a bad smell coming from your incision area.   You have fluid leaking from around your catheter (instead of through your catheter).   You are unable to flush the drain.   You have a fever or chills.   You have pain that does not get better with medicine.   You have not been contacted to schedule a drain follow up appointment  within 10 days of discharge from the hospital.   Please call [facility] at [phone] with any questions or concerns.   Get help right away if:   Your catheter comes out.   You suddenly stop having drainage from your catheter.   You suddenly have blood in the fluid that is draining from your catheter.   You become dizzy or you faint.   You develop a rash.   You have nausea or vomiting.   You have difficulty breathing or  you feel short of breath.   You develop chest pain.   You have problems with your speech or vision.   You have trouble balancing or moving your arms or legs.   Summary   It is common to have a small amount of bruising and discomfort in the area where the drainage tube (catheter) was placed. You may also have minor discomfort with movement while the drain is in place.   Flush the drain once per day with 5 mL of 0.9% normal saline (unless you were told otherwise by your healthcare provider).    Record the amount of drainage from the bag every time you empty it.   Change the dressing every 3 days or earlier if soiled/wet. Keep the skin dry under the dressing.   You may shower with the drain in place. Do not submerge the drain (no baths, swimming, hot tubs, etc.).   Contact [facility] at [phone] if you have more redness, swelling, or pain around your incision area or if you have pain that does not get better with medicine.   This information is not intended to replace advice given to you by your health care provider. Make sure you discuss any questions you have with your health care provider.   Document Revised: 10/01/2021 Document Reviewed: 06/23/2019   [employer]vier Patient Education  2023 [employer]     Interventional Radiology Drain Record   Empty your drain at least once per day. You may empty it as often as needed. Use this form to write down the amount of fluid that has collected in the drainage container. Bring this  form with you to your follow-up visits. Please call [facility] at [phone] with any questions or concerns prior to your appointment.   Drain #1 location: ___________________   Date __________ Time __________ Amount __________   Date __________ Time __________ Amount __________   Date __________ Time __________ Amount __________   Date __________ Time __________ Amount __________   Date __________ Time __________ Amount __________   Date __________ Time __________ Amount __________   Date __________ Time __________ Amount __________   Date __________ Time __________ Amount __________   Date __________ Time __________ Amount __________   Date __________ Time __________ Amount __________   Date __________ Time __________ Amount __________   Date __________ Time __________ Amount __________   Date __________ Time __________ Amount __________   Date __________ Time __________ Amount __________

## 2023-04-29 NOTE — Discharge Summary (Signed)
Physician Discharge Summary   Patient: Lorraine Yates MRN: [id number] DOB: 1970/08/18  Admit date:     04/26/2023  Discharge date: 04/29/23  Discharge Physician: Rickey Barbara   PCP: Patient, No Pcp Per   Recommendations at discharge:    Follow up with PCP in 1-2 weeks Follow up with IR as scheduled Follow up with General Surgery as scheduled Follow up with GI for colonoscopy as scheduled  Discharge Diagnoses: Principal Problem:   Acute appendicitis with perforation, generalized peritonitis, and abscess Active Problems:   Tobacco abuse   Cervical cancer (HCC)   Liver abscess   Perforated appendicitis  Resolved Problems:   * No resolved hospital problems. *  Hospital Course: 52 y.o. female with medical history significant for cervical cancer managed with chemotherapy and radiation, now presenting with worsening right-sided abdominal pain.   Patient reports developing pain in the right flank 1 week ago.  She had nausea with nonbloody vomiting at the onset, but that has resolved.  She was also experiencing chills at home.  She was evaluated in the outpatient setting and CT was ordered, but with her pain continuing to worsen, she came into the ED.   ED Course: Upon arrival to the ED, patient is found to be afebrile and saturating well on room air with mild tachycardia and stable blood pressure.  Labs are most notable for normal WBC, hemoglobin 9.9, and normal renal function.  CT findings are concerning for acute perforated appendicitis with question of developing abscess and 5 cm abscess at the caudal tip of the right liver lobe.   Surgery (Dr. Luisa Hart) was consulted by the ED  Assessment and Plan: 1. Acute appendicitis  - Continued with Zosyn for now -General Surgery consulted, recommended IR drain placement. Drain placed 10/16 -Diet advanced by General Surgery -Per Surgery, recommendation to transition to augmentin x 4 weeks to complete course -Surgery plans to continue drain for  the next several weeks -Would need colonoscopy prior to surgery, GI was referred -IR to follow up as outpatient. IR to arrange f/u   2. Liver abscess  - Likely d/t direct spread from adjacent perforated appendix  - Continue treatment with abx per above -Now s/p IR drain placement. Pt to follow up with IR as outpatient   3. Cervical cancer  - Undergoing brachytherapy and chemotherapy under the care of Dr. Bertis Ruddy      Consultants: General Surgery, IR Procedures performed: Drain placement by IR  Disposition: Home Diet recommendation:  Soft diet  DISCHARGE MEDICATION: Allergies as of 04/29/2023       Reactions   Demerol [meperidine Hcl] Itching, Rash, Other (See Comments)   Redness and itching to IV insertion site    Hydromorphone Rash   Redness and itching to IV insertion site that extended up left arm 03/10/23        Medication List     STOP taking these medications    magnesium oxide 400 (240 Mg) MG tablet Commonly known as: MAG-OX       TAKE these medications    aluminum-magnesium hydroxide 200-200 MG/5ML suspension Take 30 mLs by mouth as needed (constipation).   amoxicillin-clavulanate 875-125 MG tablet Commonly known as: AUGMENTIN Take 1 tablet by mouth 2 (two) times daily for 28 days.   bisacodyl 5 MG EC tablet Commonly known as: DULCOLAX Take 15 mg by mouth daily as needed for moderate constipation.   lidocaine-prilocaine cream Commonly known as: EMLA Apply 1 Application topically as needed. Apply to port site  1 hour to 30 minutes before port is accessed   ondansetron 8 MG tablet Commonly known as: ZOFRAN Take 8 mg by mouth every 8 (eight) hours as needed for nausea or vomiting.   oxyCODONE 5 MG immediate release tablet Commonly known as: Oxy IR/ROXICODONE Take 1 tablet (5 mg) by mouth every 4 hours as needed for severe pain. What changed: how much to take   prochlorperazine 10 MG tablet Commonly known as: COMPAZINE TAKE 1 TABLET(10 MG) BY  MOUTH EVERY 6 HOURS AS NEEDED FOR NAUSEA OR VOMITING        Follow-up Information     Karie Soda, MD. Go on 06/06/2023.   Specialties: General Surgery, Colon and Rectal Surgery Why: 06/06/23 arrive at 11:45am.., Please arrive 30 minutes early to complete check in, and bring photo ID and insurance card.  you will need to have your drain study with IR completed before this as well as had an appointment with GI to discuss colonosocpy Contact information: [address] [place] [address] [phone]         [facility] Follow up.   Specialty: Gastroenterology Why: referral has been placed for follow up with GI for planning of colonoscopy Contact information: [address] [place] [address] [phone]        Follow up with your PCP in 1-2 weeks Follow up.   Why: Hospital follow up               Discharge Exam: Filed Weights   04/26/23 2204 04/27/23 0500 04/28/23 0447  Weight: 67.8 kg 69.3 kg 68.5 kg   General exam: Awake, laying in bed, in nad Respiratory system: Normal respiratory effort, no wheezing Cardiovascular system: regular rate, s1, s2 Gastrointestinal system: Soft, nondistended, positive BS Central nervous system: CN2-12 grossly intact, strength intact Extremities: Perfused, no clubbing Skin: Normal skin turgor, no notable skin lesions seen Psychiatry: Mood normal // no visual hallucinations   Condition at discharge: fair  The results of significant diagnostics from this hospitalization (including imaging, microbiology, ancillary and laboratory) are listed below for reference.   Imaging Studies: CT GUIDED PERITONEAL/RETROPERITONEAL FLUID DRAIN BY PERC CATH  Result Date: 04/27/2023 INDICATION: Perihepatic abscess EXAM: CT-guided abscess drain placement TECHNIQUE: Multidetector CT imaging of the abdomen was performed following the standard protocol without IV contrast. RADIATION  DOSE REDUCTION: This exam was performed according to the departmental dose-optimization program which includes automated exposure control, adjustment of the mA and/or kV according to patient size and/or use of iterative reconstruction technique. MEDICATIONS: The patient is currently admitted to the hospital and receiving intravenous antibiotics. The antibiotics were administered within an appropriate time frame prior to the initiation of the procedure. ANESTHESIA/SEDATION: Moderate (conscious) sedation was employed during this procedure. A total of Versed 4 mg and Fentanyl 200 mcg was administered intravenously by the radiology nurse. Total intra-service moderate Sedation Time: 20 minutes. The patient's level of consciousness and vital signs were monitored continuously by radiology nursing throughout the procedure under my direct supervision. COMPLICATIONS: None immediate. PROCEDURE: Informed written consent was obtained from the patient after a thorough discussion of the procedural risks, benefits and alternatives. All questions were addressed. Maximal Sterile Barrier Technique was utilized including caps, mask, sterile gowns, sterile gloves, sterile drape, hand hygiene and skin antiseptic. A timeout was performed prior to the initiation of the procedure. Patient positioned left lateral decubitus on the procedure table. Right lateral abdominal skin prepped and draped in usual fashion. Following local  administration, the perihepatic abscess was accessed with 19 gauge Yueh needle utilizing CT guidance. The Yueh catheter was exchanged for a 10.2 [place] multipurpose pigtail drain over 0.035 inch guidewire. Post placement CT confirmed appropriate positioning of the drain. 30 mL of purulent materia l was aspirated. Samples were sent for Gram stain and culture. Drain secured to skin with suture and connected to bulb. IMPRESSION: Successful CT-guided perihepatic abscess drain placement (10.2 [place]). Electronically Signed    By: Acquanetta Belling M.D.   On: 04/27/2023 16:24   US Abdomen Limited RUQ (LIVER/GB)  Result Date: 04/26/2023 CLINICAL DATA:  Abdominal pain EXAM: ULTRASOUND ABDOMEN LIMITED RIGHT UPPER QUADRANT COMPARISON:  Same day CT FINDINGS: Gallbladder: Mild gallbladder wall thickening. No gallstones visualized. No sonographic Murphy sign noted by sonographer. Gallbladder polyp measuring 7 mm with thick. Common bile duct: Diameter: 7 mm Liver: Heterogeneous of the inferior right lobe of the liver of the liver with echogenic component and dirty shadowing measuring 5.1 x 3.2 x 4.6 cm. Within normal limits in parenchymal echogenicity. Portal vein is patent on color Doppler imaging with normal direction of blood flow towards the liver. Other: None. IMPRESSION: 1. Mild gallbladder wall thickening without gallstones visualized and negative sonographic Murphy sign. Findings are nonspecific and can be seen in the setting of cholecystitis, liver disease, or fluid overload states. 2. Heterogeneous lesion of the inferior right lobe of the liver, correlates with liver abscess seen on same day CT. 3. Mildly dilated common bile duct. Correlate with liver function tests. If LFTs are abnormal, consider further evaluation with MRCP. 4. Gallbladder polyp measuring 7 mm. Recommend follow-up gallbladder ultrasound in 12 months. Electronically Signed   By: Allegra Lai M.D.   On: 04/26/2023 19:50   CT ABDOMEN PELVIS W CONTRAST  Result Date: 04/26/2023 CLINICAL DATA:  Abdominal pain, acute, nonlocalized. Right flank pain over the last week. EXAM: CT ABDOMEN AND PELVIS WITH CONTRAST TECHNIQUE: Multidetector CT imaging of the abdomen and pelvis was performed using the standard protocol following bolus administration of intravenous contrast. RADIATION DOSE REDUCTION: This exam was performed according to the departmental dose-optimization program which includes automated exposure control, adjustment of the mA and/or kV according to patient  size and/or use of iterative reconstruction technique. CONTRAST:  OMNIPAQUE IOHEXOL 300 MG/ML  SOLN COMPARISON:  Ultrasound 2 days ago. FINDINGS: Lower chest: Mild linear scar or atelectasis at the right lung base. No pleural fluid. Hepatobiliary: No calcified gallstones or CT evidence of cholecystitis. Hepatomegaly. Liver abscess at the caudal tip of the right lobe with an air-fluid level measuring up to 5 cm in diameter. Adjacent appendicitis, presumably the etiology. See below. Pancreas: Normal Spleen: Normal Adrenals/Urinary Tract: Adrenal glands are normal. Kidneys are normal. Bladder is normal. Stomach/Bowel: Stomach and small intestine are normal. As noted above, there is acute appendicitis. The appendix is located in a retrocecal location, extending back up to the caudal tip of the liver. The appendix is perforated in this location and there is some phlegmonous inflammation, possibly with a small developing abscess measuring about 12 mm. This is adjacent and in contact with the liver edge and quite likely the etiology of the liver abscess. Vascular/Lymphatic: Aorta and IVC are normal.  No adenopathy. Reproductive: No pelvic organ pathology. Other: No free fluid or air. Musculoskeletal: Normal IMPRESSION: Acute appendicitis. The appendix is located in a retrocecal location, extending back up to the caudal tip of the liver. The appendix is perforated in this location and there is some phlegmonous inflammation, possibly  with a small developing abscess measuring about 12 mm. This is adjacent and in contact with the liver edge. There is a liver abscess at the caudal tip of the right lobe measuring up to 5 cm in size with a gas fluid level. The appendicitis is quite likely the etiology of the liver abscess. Electronically Signed   By: Paulina Fusi M.D.   On: 04/26/2023 17:41   US Abdomen Limited RUQ (LIVER/GB)  Result Date: 04/23/2023 CLINICAL DATA:  Severe right upper quadrant pain for 4 days. Cervical  cancer. EXAM: ULTRASOUND ABDOMEN LIMITED RIGHT UPPER QUADRANT COMPARISON:  None Available. FINDINGS: Gallbladder: A nonshadowing echogenic focus along the fundus of the gallbladder measures 6 mm. No hyperemia or ring down artifact is present. Gallbladder wall is of normal thickness at 2 mm. No sonographic Eulah Pont sign is reported. Common bile duct: Diameter: 6.0 mm, upper limits of normal. Liver: No focal lesion identified. Within normal limits in parenchymal echogenicity. Portal vein is patent on color Doppler imaging with normal direction of blood flow towards the liver. Other: None. IMPRESSION: 1. Nonshadowing echogenic focus along the fundus of the gallbladder may represent a nonshadowing stone or polyp. No evidence for acute cholecystitis. 2. Common bile duct is upper limits of normal at 6.0 mm. Electronically Signed   By: Marin Roberts M.D.   On: 04/23/2023 09:45   [place] Intraoperative  Result Date: 04/07/2023 CLINICAL DATA:  Ultrasound was provided for use by the ordering physician.  No provider Interpretation or professional fees incurred.    [place] Intraoperative  Result Date: 03/31/2023 CLINICAL DATA:  Ultrasound was provided for use by the ordering physician.  No provider Interpretation or professional fees incurred.     Microbiology: Results for orders placed or performed during the hospital encounter of 04/26/23  Aerobic/Anaerobic Culture w Gram Stain (surgical/deep wound)     Status: None (Preliminary result)   Collection Time: 04/27/23  1:23 PM   Specimen: Abscess  Result Value Ref Range Status   Specimen Description   Final    ABSCESS Performed at [facility], [address], [place], [place] [address]    Special Requests   Final    NONE Performed at [facility], [address], [place], [place] [address]    Gram Stain   Final    NO WBC SEEN ABUNDANT GRAM NEGATIVE RODS ABUNDANT GRAM POSITIVE COCCI IN PAIRS ABUNDANT GRAM POSITIVE RODS    Culture    Final    ABUNDANT ESCHERICHIA COLI CULTURE REINCUBATED FOR BETTER GROWTH SUSCEPTIBILITIES TO FOLLOW Performed at [facility], [address], [place], [place] [address]    Report Status PENDING  Incomplete   Organism ID, Bacteria ESCHERICHIA COLI  Final      Susceptibility   Escherichia coli - MIC*    AMPICILLIN <=2 SENSITIVE Sensitive     CEFEPIME <=0.12 SENSITIVE Sensitive     CEFTAZIDIME <=1 SENSITIVE Sensitive     CEFTRIAXONE <=0.25 SENSITIVE Sensitive     CIPROFLOXACIN <=0.25 SENSITIVE Sensitive     GENTAMICIN <=1 SENSITIVE Sensitive     IMIPENEM <=0.25 SENSITIVE Sensitive     TRIMETH/SULFA <=20 SENSITIVE Sensitive     AMPICILLIN/SULBACTAM <=2 SENSITIVE Sensitive     PIP/TAZO <=4 SENSITIVE Sensitive ug/mL    * ABUNDANT ESCHERICHIA COLI    Labs: CBC: Recent Labs  Lab 04/26/23 1419 04/27/23 0017 04/28/23 0215 04/29/23 0259  WBC 7.3 6.6 4.6 3.1*  NEUTROABS 6.1  --   --   --  HGB 9.9* 8.7* 8.0* 8.4*  HCT 30.4* 27.0* 24.7* 26.8*  MCV 98.1 99.6 100.4* 101.5*  PLT 315 303 316 380   Basic Metabolic Panel: Recent Labs  Lab 04/26/23 1419 04/27/23 0017 04/28/23 0215 04/29/23 0259  NA 133* 135 130* 137  K 3.6 3.1* 3.4* 3.2*  CL 97* 97* 93* 101  CO2 25 28 27 27   GLUCOSE 103* 92 96 95  BUN 11 13 10 9   CREATININE 0.89 0.82 0.84 0.91  CALCIUM 8.8* 8.3* 8.1* 8.3*  MG  --  1.9  --   --    Liver Function Tests: Recent Labs  Lab 04/26/23 1419 04/27/23 0017 04/28/23 0215 04/29/23 0259  AST 15 11* 11* 10*  ALT 13 11 9 10   ALKPHOS 221* 188* 163* 154*  BILITOT 0.9 0.8 0.6 0.7  PROT 7.6 6.8 6.4* 6.4*  ALBUMIN 3.0* 2.6* 2.4* 2.6*   CBG: No results for input(s): "GLUCAP" in the last 168 hours.  Discharge time spent: less than 30 minutes.  Signed: Rickey Barbara, MD [facility] 04/29/2023

## 2023-04-29 NOTE — Progress Notes (Signed)
Progress Note     Subjective: Tolerating soft diet. Some ongoing abdominal pain that disrupts sleep. No n/v   Objective: Vital signs in last 24 hours: Temp:  [98.6 F (37 C)-98.9 F (37.2 C)] 98.7 F (37.1 C) (10/18 0455) Pulse Rate:  [72-83] 72 (10/18 0455) Resp:  [16-17] 17 (10/18 0455) BP: (121-131)/(75-80) 123/79 (10/18 0455) SpO2:  [99 %-100 %] 100 % (10/18 0455) Last BM Date : 04/28/23  Intake/Output from previous day: 10/17 0701 - 10/18 0700 In: 370 [P.O.:360] Out: 40 [Drains:40] Intake/Output this shift: No intake/output data recorded.  PE: General: pleasant, WD, female who is laying in bed in NAD Lungs:Respiratory effort nonlabored Abd: soft, ND, mild TTP RUQ and right mid abdomen. Drain in place with purulent output, 40 ml last 24h MSK: all 4 extremities are symmetrical with no cyanosis, clubbing, or edema. Skin: warm and dry  Psych: A&Ox3 with an appropriate affect.    Lab Results:  Recent Labs    04/28/23 0215 04/29/23 0259  WBC 4.6 3.1*  HGB 8.0* 8.4*  HCT 24.7* 26.8*  PLT 316 380   BMET Recent Labs    04/28/23 0215 04/29/23 0259  NA 130* 137  K 3.4* 3.2*  CL 93* 101  CO2 27 27  GLUCOSE 96 95  BUN 10 9  CREATININE 0.84 0.91  CALCIUM 8.1* 8.3*   PT/INR No results for input(s): "LABPROT", "INR" in the last 72 hours. CMP     Component Value Date/Time   NA 137 04/29/2023 0259   K 3.2 (L) 04/29/2023 0259   CL 101 04/29/2023 0259   CO2 27 04/29/2023 0259   GLUCOSE 95 04/29/2023 0259   BUN 9 04/29/2023 0259   CREATININE 0.91 04/29/2023 0259   CREATININE 1.24 (H) 04/22/2023 1102   CALCIUM 8.3 (L) 04/29/2023 0259   PROT 6.4 (L) 04/29/2023 0259   ALBUMIN 2.6 (L) 04/29/2023 0259   AST 10 (L) 04/29/2023 0259   AST 10 (L) 04/22/2023 1102   ALT 10 04/29/2023 0259   ALT 7 04/22/2023 1102   ALKPHOS 154 (H) 04/29/2023 0259   BILITOT 0.7 04/29/2023 0259   BILITOT 0.7 04/22/2023 1102   GFRNONAA >60 04/29/2023 0259   GFRNONAA 53 (L)  04/22/2023 1102   Lipase  No results found for: "LIPASE"     Studies/Results: CT GUIDED PERITONEAL/RETROPERITONEAL FLUID DRAIN BY PERC CATH  Result Date: 04/27/2023 INDICATION: Perihepatic abscess EXAM: CT-guided abscess drain placement TECHNIQUE: Multidetector CT imaging of the abdomen was performed following the standard protocol without IV contrast. RADIATION DOSE REDUCTION: This exam was performed according to the departmental dose-optimization program which includes automated exposure control, adjustment of the mA and/or kV according to patient size and/or use of iterative reconstruction technique. MEDICATIONS: The patient is currently admitted to the hospital and receiving intravenous antibiotics. The antibiotics were administered within an appropriate time frame prior to the initiation of the procedure. ANESTHESIA/SEDATION: Moderate (conscious) sedation was employed during this procedure. A total of Versed 4 mg and Fentanyl 200 mcg was administered intravenously by the radiology nurse. Total intra-service moderate Sedation Time: 20 minutes. The patient's level of consciousness and vital signs were monitored continuously by radiology nursing throughout the procedure under my direct supervision. COMPLICATIONS: None immediate. PROCEDURE: Informed written consent was obtained from the patient after a thorough discussion of the procedural risks, benefits and alternatives. All questions were addressed. Maximal Sterile Barrier Technique was utilized including caps, mask, sterile gowns, sterile gloves, sterile drape, hand hygiene and skin antiseptic. A  timeout was performed prior to the initiation of the procedure. Patient positioned left lateral decubitus on the procedure table. Right lateral abdominal skin prepped and draped in usual fashion. Following local administration, the perihepatic abscess was accessed with 19 gauge Yueh needle utilizing CT guidance. The Yueh catheter was exchanged for a 10.2  [place] multipurpose pigtail drain over 0.035 inch guidewire. Post placement CT confirmed appropriate positioning of the drain. 30 mL of purulent materia l was aspirated. Samples were sent for Gram stain and culture. Drain secured to skin with suture and connected to bulb. IMPRESSION: Successful CT-guided perihepatic abscess drain placement (10.2 [place]). Electronically Signed   By: Acquanetta Belling M.D.   On: 04/27/2023 16:24    Anti-infectives: Anti-infectives (From admission, onward)    Start     Dose/Rate Route Frequency Ordered Stop   04/26/23 2200  piperacillin-tazobactam (ZOSYN) IVPB 3.375 g        3.375 g 12.5 mL/hr over 240 Minutes Intravenous Every 8 hours 04/26/23 1908     04/26/23 1600  piperacillin-tazobactam (ZOSYN) IVPB 3.375 g        3.375 g 100 mL/hr over 30 Minutes Intravenous  Once 04/26/23 1554 04/26/23 1633        Assessment/Plan Perforated appendicitis with liver abscess   - s/p IR drain placement 10/16. Cx with e coli - cont IV abx - tolerating soft diet - she will likely benefit from interval appendectomy in approx 6 weeks pending improvement from acute infection. Recommend colonoscopy prior and I have placed an urgent referral - recommend ongoing bowel regimen for constipation prevention  Stable for discharge from surgery standpoint on 4 weeks antibiotics such as augmentin in setting of liver abscess  FEN: soft ID: zosyn [id number] for chemical prophylaxis from surgical standpoint  Per primary Cervical cancer s/p chemo and radiation. On immunotherapy anemia  I reviewed Consultant  IR notes, hospitalist notes, last 24 h vitals and pain scores, last 48 h intake and output, last 24 h labs and trends, and last 24 h imaging results.    LOS: 3 days   Eric Form, [facility] Surgery 04/29/2023, 8:32 AM Please see Amion for pager number during day hours 7:00am-4:30pm

## 2023-04-29 NOTE — Progress Notes (Signed)
Referring Physician(s): Gross,S  Supervising Physician: Marliss Coots  Patient Status:  [facility] - In-pt  Chief Complaint: Perihepatic abscess/perforated appendicitis    Subjective: Pt being dc'd home today per TRH; denies fever, worsening abd pain,N/V; has some soreness at rt abd drain site   Allergies: Demerol [meperidine hcl] and Hydromorphone  Medications: Prior to Admission medications   Medication Sig Start Date End Date Taking? Authorizing Provider  aluminum-magnesium hydroxide 200-200 MG/5ML suspension Take 30 mLs by mouth as needed (constipation).   Yes [provider]  amoxicillin-clavulanate (AUGMENTIN) 875-125 MG tablet Take 1 tablet by mouth 2 (two) times daily for 28 days. 04/29/23 05/27/23 Yes Jerald Kief, MD  bisacodyl (DULCOLAX) 5 MG EC tablet Take 15 mg by mouth daily as needed for moderate constipation.   Yes [provider]  lidocaine-prilocaine (EMLA) cream Apply 1 Application topically as needed. Apply to port site 1 hour to 30 minutes before port is accessed 12/22/22  Yes Gorsuch, Ni, MD  ondansetron (ZOFRAN) 8 MG tablet Take 8 mg by mouth every 8 (eight) hours as needed for nausea or vomiting.   Yes [provider]  oxyCODONE (OXY IR/ROXICODONE) 5 MG immediate release tablet Take 1 tablet (5 mg) by mouth every 4 hours as needed for severe pain. Patient taking differently: Take 15 mg by mouth every 4 (four) hours as needed for severe pain (pain score 7-10). 04/20/23  Yes Gorsuch, Ni, MD  prochlorperazine (COMPAZINE) 10 MG tablet TAKE 1 TABLET(10 MG) BY MOUTH EVERY 6 HOURS AS NEEDED FOR NAUSEA OR VOMITING 01/21/23  Yes Gorsuch, Ni, MD  magnesium oxide (MAG-OX) 400 (240 Mg) MG tablet Take 400 mg by mouth daily. Patient not taking: Reported on 04/28/2023    [provider]     Vital Signs: BP 117/73 (BP Location: Right Arm)   Pulse 85   Temp 98.8 F (37.1 C)   Resp 16   Ht 5\' 4"  (1.626 m)   Wt 151 lb 0.2 oz (68.5 kg)    SpO2 100%   BMI 25.92 kg/m   Physical Exam: awake/alert; RUQ drain intact, insertion site ok, mildly tender, OP 40+ cc turbid, yellow fluid; drain flushed without difficulty  Imaging: CT GUIDED PERITONEAL/RETROPERITONEAL FLUID DRAIN BY PERC CATH  Result Date: 04/27/2023 INDICATION: Perihepatic abscess EXAM: CT-guided abscess drain placement TECHNIQUE: Multidetector CT imaging of the abdomen was performed following the standard protocol without IV contrast. RADIATION DOSE REDUCTION: This exam was performed according to the departmental dose-optimization program which includes automated exposure control, adjustment of the mA and/or kV according to patient size and/or use of iterative reconstruction technique. MEDICATIONS: The patient is currently admitted to the hospital and receiving intravenous antibiotics. The antibiotics were administered within an appropriate time frame prior to the initiation of the procedure. ANESTHESIA/SEDATION: Moderate (conscious) sedation was employed during this procedure. A total of Versed 4 mg and Fentanyl 200 mcg was administered intravenously by the radiology nurse. Total intra-service moderate Sedation Time: 20 minutes. The patient's level of consciousness and vital signs were monitored continuously by radiology nursing throughout the procedure under my direct supervision. COMPLICATIONS: None immediate. PROCEDURE: Informed written consent was obtained from the patient after a thorough discussion of the procedural risks, benefits and alternatives. All questions were addressed. Maximal Sterile Barrier Technique was utilized including caps, mask, sterile gowns, sterile gloves, sterile drape, hand hygiene and skin antiseptic. A timeout was performed prior to the initiation of the procedure. Patient positioned left lateral decubitus on the procedure table. Right  lateral abdominal skin prepped and draped in usual fashion. Following local administration, the perihepatic abscess  was accessed with 19 gauge Yueh needle utilizing CT guidance. The Yueh catheter was exchanged for a 10.2 [place] multipurpose pigtail drain over 0.035 inch guidewire. Post placement CT confirmed appropriate positioning of the drain. 30 mL of purulent materia l was aspirated. Samples were sent for Gram stain and culture. Drain secured to skin with suture and connected to bulb. IMPRESSION: Successful CT-guided perihepatic abscess drain placement (10.2 [place]). Electronically Signed   By: Acquanetta Belling M.D.   On: 04/27/2023 16:24   US Abdomen Limited RUQ (LIVER/GB)  Result Date: 04/26/2023 CLINICAL DATA:  Abdominal pain EXAM: ULTRASOUND ABDOMEN LIMITED RIGHT UPPER QUADRANT COMPARISON:  Same day CT FINDINGS: Gallbladder: Mild gallbladder wall thickening. No gallstones visualized. No sonographic Murphy sign noted by sonographer. Gallbladder polyp measuring 7 mm with thick. Common bile duct: Diameter: 7 mm Liver: Heterogeneous of the inferior right lobe of the liver of the liver with echogenic component and dirty shadowing measuring 5.1 x 3.2 x 4.6 cm. Within normal limits in parenchymal echogenicity. Portal vein is patent on color Doppler imaging with normal direction of blood flow towards the liver. Other: None. IMPRESSION: 1. Mild gallbladder wall thickening without gallstones visualized and negative sonographic Murphy sign. Findings are nonspecific and can be seen in the setting of cholecystitis, liver disease, or fluid overload states. 2. Heterogeneous lesion of the inferior right lobe of the liver, correlates with liver abscess seen on same day CT. 3. Mildly dilated common bile duct. Correlate with liver function tests. If LFTs are abnormal, consider further evaluation with MRCP. 4. Gallbladder polyp measuring 7 mm. Recommend follow-up gallbladder ultrasound in 12 months. Electronically Signed   By: Allegra Lai M.D.   On: 04/26/2023 19:50   CT ABDOMEN PELVIS W CONTRAST  Result Date: 04/26/2023 CLINICAL  DATA:  Abdominal pain, acute, nonlocalized. Right flank pain over the last week. EXAM: CT ABDOMEN AND PELVIS WITH CONTRAST TECHNIQUE: Multidetector CT imaging of the abdomen and pelvis was performed using the standard protocol following bolus administration of intravenous contrast. RADIATION DOSE REDUCTION: This exam was performed according to the departmental dose-optimization program which includes automated exposure control, adjustment of the mA and/or kV according to patient size and/or use of iterative reconstruction technique. CONTRAST:  OMNIPAQUE IOHEXOL 300 MG/ML  SOLN COMPARISON:  Ultrasound 2 days ago. FINDINGS: Lower chest: Mild linear scar or atelectasis at the right lung base. No pleural fluid. Hepatobiliary: No calcified gallstones or CT evidence of cholecystitis. Hepatomegaly. Liver abscess at the caudal tip of the right lobe with an air-fluid level measuring up to 5 cm in diameter. Adjacent appendicitis, presumably the etiology. See below. Pancreas: Normal Spleen: Normal Adrenals/Urinary Tract: Adrenal glands are normal. Kidneys are normal. Bladder is normal. Stomach/Bowel: Stomach and small intestine are normal. As noted above, there is acute appendicitis. The appendix is located in a retrocecal location, extending back up to the caudal tip of the liver. The appendix is perforated in this location and there is some phlegmonous inflammation, possibly with a small developing abscess measuring about 12 mm. This is adjacent and in contact with the liver edge and quite likely the etiology of the liver abscess. Vascular/Lymphatic: Aorta and IVC are normal.  No adenopathy. Reproductive: No pelvic organ pathology. Other: No free fluid or air. Musculoskeletal: Normal IMPRESSION: Acute appendicitis. The appendix is located in a retrocecal location, extending back up to the caudal tip of the liver. The appendix is  perforated in this location and there is some phlegmonous inflammation, possibly with a  small developing abscess measuring about 12 mm. This is adjacent and in contact with the liver edge. There is a liver abscess at the caudal tip of the right lobe measuring up to 5 cm in size with a gas fluid level. The appendicitis is quite likely the etiology of the liver abscess. Electronically Signed   By: Paulina Fusi M.D.   On: 04/26/2023 17:41    Labs:  CBC: Recent Labs    04/26/23 1419 04/27/23 0017 04/28/23 0215 04/29/23 0259  WBC 7.3 6.6 4.6 3.1*  HGB 9.9* 8.7* 8.0* 8.4*  HCT 30.4* 27.0* 24.7* 26.8*  PLT 315 303 316 380    COAGS: No results for input(s): "INR", "APTT" in the last 8760 hours.  BMP: Recent Labs    04/26/23 1419 04/27/23 0017 04/28/23 0215 04/29/23 0259  NA 133* 135 130* 137  K 3.6 3.1* 3.4* 3.2*  CL 97* 97* 93* 101  CO2 25 28 27 27   GLUCOSE 103* 92 96 95  BUN 11 13 10 9   CALCIUM 8.8* 8.3* 8.1* 8.3*  CREATININE 0.89 0.82 0.84 0.91  GFRNONAA >60 >60 >60 >60    LIVER FUNCTION TESTS: Recent Labs    04/26/23 1419 04/27/23 0017 04/28/23 0215 04/29/23 0259  BILITOT 0.9 0.8 0.6 0.7  AST 15 11* 11* 10*  ALT 13 11 9 10   ALKPHOS 221* 188* 163* 154*  PROT 7.6 6.8 6.4* 6.4*  ALBUMIN 3.0* 2.6* 2.4* 2.6*    Assessment and Plan: Pt with hx perforated appendicitis with assoc perihepatic abscess; s/p perihepatic drain placement 10/16; afebrile ; WBC 3.1, HGB 8.4, K 3.2, CREAT NL; drain fl cx- e coli; as outpatient rec once daily flushing of drain once daily with 5 cc sterile saline, output recording and dressing change every 2-3 days; pt instructed on drain care/flushing; we will set pt up for f/u IR drain clinic visit in 7-10 days   Electronically Signed: D. Jeananne Rama, PA-C 04/29/2023, 2:39 PM   I spent a total of 15 Minutes at the the patient's bedside AND on the patient's hospital floor or unit, greater than 50% of which was counseling/coordinating care for perihepatic abscess drain    Patient ID: Lorraine Yates, female   DOB: Nov 12, 1970, 52  y.o.   MRN: [id number]

## 2023-04-29 NOTE — Plan of Care (Signed)
[facility] Tonsillectomy/Adenoidectomy, Postoperative PEDS care plan entered in error.

## 2023-04-29 NOTE — Telephone Encounter (Signed)
Lorraine Yates, patient was recently admitted to the hospital due to having a perforated appendicitis status post IR drain placement for liver abscess and Carl Best general surgery PA sent me a message requesting GI follow-up for a colonoscopy in about 6 weeks.  Please contact the patient and schedule her for new patient office visit within the next 2 to 3 weeks to facilitate scheduling a colonoscopy.  Thank you.

## 2023-05-02 ENCOUNTER — Encounter: Payer: Self-pay | Admitting: Hematology and Oncology

## 2023-05-02 ENCOUNTER — Other Ambulatory Visit: Payer: Self-pay

## 2023-05-02 ENCOUNTER — Other Ambulatory Visit (HOSPITAL_COMMUNITY): Payer: Self-pay

## 2023-05-02 ENCOUNTER — Other Ambulatory Visit: Payer: Self-pay | Admitting: Hematology and Oncology

## 2023-05-02 ENCOUNTER — Telehealth: Payer: Self-pay

## 2023-05-02 ENCOUNTER — Ambulatory Visit (HOSPITAL_COMMUNITY): Payer: BC Managed Care – PPO

## 2023-05-02 LAB — AEROBIC/ANAEROBIC CULTURE W GRAM STAIN (SURGICAL/DEEP WOUND): Gram Stain: NONE SEEN

## 2023-05-02 MED ORDER — NORMAL SALINE FLUSH 0.9 % IV SOLN
INTRAVENOUS | 3 refills | Status: DC
  Filled 2023-05-02: qty 300, 30d supply, fill #0

## 2023-05-02 MED ORDER — OXYCODONE HCL 10 MG PO TABS
10.0000 mg | ORAL_TABLET | ORAL | 0 refills | Status: DC | PRN
Start: 2023-05-02 — End: 2023-06-21
  Filled 2023-05-02 (×2): qty 90, 15d supply, fill #0

## 2023-05-02 NOTE — Telephone Encounter (Signed)
Called to see how she is doing since being d/ced home from the hospital. She is doing much better. She is able eat/drink. Denies constipation.  She is requesting refill of Oxycodone to [facility]. She is taking Oxycodone 2 tabs every 4 hours for pain. Her pain is mainly at the drain/ tube site.

## 2023-05-02 NOTE — Telephone Encounter (Signed)
Called and given below message. She verbalized understanding. 

## 2023-05-02 NOTE — Telephone Encounter (Signed)
I sent the new prescription strength of 10 mg to [facility] so she only need to take 1 tab

## 2023-05-04 NOTE — Telephone Encounter (Signed)
Pt was scheduled to see Alcide Evener NP on 05/09/2023 at 1:30 PM. Left message for pt to call back

## 2023-05-05 ENCOUNTER — Other Ambulatory Visit: Payer: Self-pay | Admitting: Surgery

## 2023-05-05 DIAGNOSIS — K35211 Acute appendicitis with generalized peritonitis, with perforation and abscess: Secondary | ICD-10-CM

## 2023-05-05 NOTE — Telephone Encounter (Signed)
Spoke with patient & provided address/location of where to go for OV. Pt had no further questions.

## 2023-05-09 ENCOUNTER — Ambulatory Visit (INDEPENDENT_AMBULATORY_CARE_PROVIDER_SITE_OTHER): Payer: BC Managed Care – PPO | Admitting: Nurse Practitioner

## 2023-05-09 ENCOUNTER — Encounter: Payer: Self-pay | Admitting: Nurse Practitioner

## 2023-05-09 VITALS — BP 102/68 | HR 88 | Ht 63.5 in | Wt 147.0 lb

## 2023-05-09 DIAGNOSIS — K3532 Acute appendicitis with perforation and localized peritonitis, without abscess: Secondary | ICD-10-CM

## 2023-05-09 MED ORDER — NA SULFATE-K SULFATE-MG SULF 17.5-3.13-1.6 GM/177ML PO SOLN: 1.0000 | Freq: Once | ORAL | 0 refills | Status: AC

## 2023-05-09 NOTE — Patient Instructions (Addendum)
You have been scheduled for a colonoscopy. Please follow written instructions given to you at your visit today.  Please pick up your prep supplies at the pharmacy within the next 1-3 days. If you use inhalers (even only as needed), please bring them with you on the day of your procedure.  Due to recent changes in healthcare laws, you may see the results of your imaging and laboratory studies on MyChart before your provider has had a chance to review them.  We understand that in some cases there may be results that are confusing or concerning to you. Not all laboratory results come back in the same time frame and the provider may be waiting for multiple results in order to interpret others.  Please give us 48 hours in order for your provider to thoroughly review all the results before contacting the office for clarification of your results.   Thank you for trusting me with your gastrointestinal care!   Colleen Kennedy-Smith, CRNP   

## 2023-05-09 NOTE — Progress Notes (Signed)
05/09/2023 Lorraine Yates [id number] 1971/05/25   CHIEF COMPLAINT: Schedule a colonoscopy   HISTORY OF PRESENT ILLNESS: Lorraine Yates is a 52 year old female with a past medical history of cervical cancer treated with chemo and radiation and subsequently started on immunotherapy. She presented to the ED 04/26/2023 with right flank pain. Admission labs showed a normal WBC and hemoglobin 9.9. CTAP consistent with acute perforated appendicitis with question of developing abscess and a 5 cm abscess at the caudal tip of the right liver lobe. General surgery was consulted, she was placed on Zosyn IV and percutaneous perihepatic drain was placed by IR.  Cultures positive for E. coli, Klebsiella pneumoniae and Streptococcus anginosus. She was discharged home 04/29/2023 on Augmentin p.o. x 4 weeks. She is scheduled for follow up CTAP per interventional radiology on 05/18/2023 and will potentially have the percutaneous perihepatic drain removed at that time. She presents to our office today as referred by Chalmers Cater PA-C to schedule a colonoscopy 6 to 8 weeks status post acute appendicitis. Colonoscopy required prior to pursuing a future appendectomy.  She continues to have nausea for which she takes Ondansetron and Compazine as needed. No vomiting. At times, she does not feel hungry. No fevers. No significant weight loss. She sometimes has mild pain to the right perc drain site if she coughs or laughs.  She describes emptying 15 to 20 mL of milky green drainage from the JP drain.  She is passing brown soft mushy stool once or twice daily. No rectal bleeding or black stools. She has occasional heartburn. No dysphagia.  She is cutting back her cigarette intake, smokes 2 to 3 cigarettes daily. She smokes marijuana 2-3 times monthly. Rare alcohol use. She denies ever having a screening colonoscopy.      Latest Ref Rng & Units 04/29/2023    2:59 AM 04/28/2023    2:15 AM 04/27/2023   12:17 AM  CBC  WBC  4.0 - 10.5 K/uL 3.1  4.6  6.6   Hemoglobin 12.0 - 15.0 g/dL 8.4  8.0  8.7   Hematocrit 36.0 - 46.0 % 26.8  24.7  27.0   Platelets 150 - 400 K/uL 380  316  303          Latest Ref Rng & Units 04/29/2023    2:59 AM 04/28/2023    2:15 AM 04/27/2023   12:17 AM  CMP  Glucose 70 - 99 mg/dL 95  96  92   BUN 6 - 20 mg/dL 9  10  13    Creatinine 0.44 - 1.00 mg/dL 3.47  4.25  9.56   Sodium 135 - 145 mmol/L 137  130  135   Potassium 3.5 - 5.1 mmol/L 3.2  3.4  3.1   Chloride 98 - 111 mmol/L 101  93  97   CO2 22 - 32 mmol/L 27  27  28    Calcium 8.9 - 10.3 mg/dL 8.3  8.1  8.3   Total Protein 6.5 - 8.1 g/dL 6.4  6.4  6.8   Total Bilirubin 0.3 - 1.2 mg/dL 0.7  0.6  0.8   Alkaline Phos 38 - 126 U/L 154  163  188   AST 15 - 41 U/L 10  11  11    ALT 0 - 44 U/L 10  9  11      CTAP with contrast 04/26/2023: FINDINGS: Lower chest: Mild linear scar or atelectasis at the right lung base. No pleural fluid.   Hepatobiliary: No calcified  gallstones or CT evidence of cholecystitis. Hepatomegaly. Liver abscess at the caudal tip of the right lobe with an air-fluid level measuring up to 5 cm in diameter. Adjacent appendicitis, presumably the etiology. See below.   Pancreas: Normal   Spleen: Normal   Adrenals/Urinary Tract: Adrenal glands are normal. Kidneys are normal. Bladder is normal.   Stomach/Bowel: Stomach and small intestine are normal. As noted above, there is acute appendicitis. The appendix is located in a retrocecal location, extending back up to the caudal tip of the liver. The appendix is perforated in this location and there is some phlegmonous inflammation, possibly with a small developing abscess measuring about 12 mm. This is adjacent and in contact with the liver edge and quite likely the etiology of the liver abscess.   Vascular/Lymphatic: Aorta and IVC are normal.  No adenopathy.   Reproductive: No pelvic organ pathology.   Other: No free fluid or air.   Musculoskeletal:  Normal   IMPRESSION: Acute appendicitis. The appendix is located in a retrocecal location, extending back up to the caudal tip of the liver. The appendix is perforated in this location and there is some phlegmonous inflammation, possibly with a small developing abscess measuring about 12 mm. This is adjacent and in contact with the liver edge. There is a liver abscess at the caudal tip of the right lobe measuring up to 5 cm in size with a gas fluid level. The appendicitis is quite likely the etiology of the liver abscess.     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   oncologist--- dr gorsuch/  radiation oncologist--- dr Roselind Messier;  dx 05/ 2024;   chemo 01-20-2023 to 02-28-2023;   IMRT 01-20-2023 to 03-03-2023  to start high dose radiation 03-10-2023 w/ tandem   Port-A-Cath in place 12/16/2022   Past Surgical History:  Procedure Laterality Date   IR IMAGING GUIDED PORT INSERTION  12/16/2022   NO PAST SURGERIES     OPERATIVE ULTRASOUND N/A 03/10/2023   Procedure: OPERATIVE ULTRASOUND;  Surgeon: Antony Blackbird, MD;  Location: [facility];  Service: Urology;  Laterality: N/A;   OPERATIVE ULTRASOUND N/A 03/15/2023   Procedure: OPERATIVE ULTRASOUND;  Surgeon: Antony Blackbird, MD;  Location: [facility];  Service: Urology;  Laterality: N/A;   OPERATIVE ULTRASOUND N/A 03/21/2023   Procedure: OPERATIVE ULTRASOUND;  Surgeon: Antony Blackbird, MD;  Location: [facility];  Service: Urology;  Laterality: N/A;   OPERATIVE ULTRASOUND N/A 03/31/2023   Procedure: OPERATIVE ULTRASOUND;  Surgeon: Antony Blackbird, MD;  Location: [facility];  Service: Urology;  Laterality: N/A;   OPERATIVE ULTRASOUND N/A 04/07/2023   Procedure: OPERATIVE ULTRASOUND;  Surgeon: Antony Blackbird, MD;   Location: [facility];  Service: Urology;  Laterality: N/A;   TANDEM RING INSERTION N/A 03/10/2023   Procedure: TANDEM RING INSERTION;  Surgeon: Antony Blackbird, MD;  Location: [facility];  Service: Urology;  Laterality: N/A;   TANDEM RING INSERTION N/A 03/15/2023   Procedure: TANDEM RING INSERTION;  Surgeon: Antony Blackbird, MD;  Location: [facility];  Service: Urology;  Laterality: N/A;   TANDEM RING INSERTION N/A 03/21/2023   Procedure: TANDEM RING INSERTION;  Surgeon: Antony Blackbird, MD;  Location: [facility];  Service: Urology;  Laterality:  N/A;   TANDEM RING INSERTION N/A 03/31/2023   Procedure: TANDEM RING INSERTION;  Surgeon: Antony Blackbird, MD;  Location: [facility];  Service: Urology;  Laterality: N/A;   TANDEM RING INSERTION N/A 04/07/2023   Procedure: TANDEM RING INSERTION;  Surgeon: Antony Blackbird, MD;  Location: [facility];  Service: Urology;  Laterality: N/A;   Social History: She is married.  She has 2 sons and 2 daughters.  She is works as an [employer]ar[employer] manager.  She smokes 2 cigarettes daily.  She smokes marijuana 2 to 3 days monthly.  Rare alcohol intake.  Family History: Father with history of prostate cancer and Alzheimer's disease.  Mother with history of Alzheimer's disease.  Known family history of esophageal, gastric or colon cancer.   family history includes Alzheimer's disease in her father and mother; Prostate cancer in her father. Allergies  Allergen Reactions   Demerol [Meperidine Hcl] Itching, Rash and Other (See Comments)    Redness and itching to IV insertion site    Hydromorphone Rash    Redness and itching to IV insertion site that extended up left arm 03/10/23      Outpatient Encounter Medications as of 05/09/2023  Medication Sig   aluminum-magnesium hydroxide 200-200 MG/5ML suspension Take 30 mLs by mouth as needed (constipation).   amoxicillin-clavulanate  (AUGMENTIN) 875-125 MG tablet Take 1 tablet by mouth 2 (two) times daily for 28 days.   bisacodyl (DULCOLAX) 5 MG EC tablet Take 15 mg by mouth daily as needed for moderate constipation.   lidocaine-prilocaine (EMLA) cream Apply 1 Application topically as needed. Apply to port site 1 hour to 30 minutes before port is accessed   ondansetron (ZOFRAN) 8 MG tablet Take 8 mg by mouth every 8 (eight) hours as needed for nausea or vomiting.   Oxycodone HCl 10 MG TABS Take 1 tablet (10 mg total) by mouth every 4 (four) hours as needed for severe pain (pain score 7-10).   prochlorperazine (COMPAZINE) 10 MG tablet TAKE 1 TABLET(10 MG) BY MOUTH EVERY 6 HOURS AS NEEDED FOR NAUSEA OR VOMITING   Sodium Chloride Flush (NORMAL SALINE FLUSH) 0.9 % SOLN Flush abdmonial drain once daily with 5ml sterile saline   No facility-administered encounter medications on file as of 05/09/2023.   REVIEW OF SYSTEMS:  Gen: Denies fever, sweats or chills. No significant weight loss.  CV: Denies chest pain, palpitations or edema. Resp: Denies cough, shortness of breath of hemoptysis.  GI: See HPI.  GU: Denies urinary burning, blood in urine, increased urinary frequency or incontinence. MS: Denies joint pain, muscles aches or weakness. Derm: Denies rash, itchiness, skin lesions or unhealing ulcers. Psych: Denies depression, anxiety, memory loss or confusion. Heme: Denies bruising, easy bleeding. Neuro:  Denies headaches, dizziness or paresthesias. Endo:  Denies any problems with DM, thyroid or adrenal function.  PHYSICAL EXAM: BP 102/68 (BP Location: Left Arm, Patient Position: Sitting, Cuff Size: Normal)   Pulse 88   Ht 5' 3.5" (1.613 m) Comment: height measured without shoes  Wt 147 lb (66.7 kg)   BMI 25.63 kg/m  Wt Readings from Last 3 Encounters:  05/09/23 147 lb (66.7 kg)  04/28/23 151 lb 0.2 oz (68.5 kg)  04/22/23 151 lb (68.5 kg)    General: 52 year old female in no acute distress. Head: Normocephalic and  atraumatic. Eyes:  Sclerae non-icteric, conjunctive pink. Ears: Normal auditory acuity. Mouth: Dentition intact. No ulcers or lesions.  Neck: Supple, no lymphadenopathy or thyromegaly.  Lungs: Clear bilaterally to  auscultation without wheezes, crackles or rhonchi. Heart: Regular rate and rhythm. No murmur, rub or gallop appreciated.  Abdomen: Soft, nontender, nondistended. No masses. No hepatosplenomegaly. Normoactive bowel sounds x 4 quadrants.  Right mid abdominal perc drain dressing intact, a small amount of milky green drainage in the JP bulb. Rectal: Deferred. Musculoskeletal: Symmetrical with no gross deformities. Skin: Warm and dry. No rash or lesions on visible extremities. Extremities: No edema. Neurological: Alert oriented x 4, no focal deficits.  Psychological:  Alert and cooperative. Normal mood and affect.  ASSESSMENT AND PLAN:  52 year old female admitted to the hospital with perforated appendicitis with abscess and associated liver abscess treated with IV antibiotics then Augmentin p.o. x 4 weeks.  General surgery requesting colonoscopy prior to pursuing future appendectomy. -Follow-up CTAP with IR scheduled 05/18/2023, perc drain possibly to be removed if CT negative for residual abscess/fluid collection -Schedule colonoscopy with Dr. Adela Lank early December 2024, will review CTAP results to further confirm if it is appropriate for the patient to proceed with a colonoscopy as scheduled -Patient will contact our office if she develops recurrent of abdominal pain to her colonoscopy date  Cervical cancer initially diagnosed 11/2022 status post brachytherapy, chemotherapy and currently receiving immunotherapy -Continue follow-up with Dr. Bertis Ruddy  Anemia with macrocytic component, cute appendicitis with liver abscess as well as treatment for cervical cancer likely contributing factors. No overt GI bleeding. Hemoglobin 8.4. MCV 101.5.  -Continue CBC follow-up with Dr.  Bertis Ruddy  Gallbladder polyp per RUQ sonogram 04/26/2023.  Elevated alk phos level with normal total bili, AST/ALT levels. -Check hepatic panel and GGT in 1 to 2 months       CC:  Eric Form, PA-C

## 2023-05-09 NOTE — Progress Notes (Signed)
Agree with assessment and plan as outlined.  

## 2023-05-10 ENCOUNTER — Encounter: Payer: Self-pay | Admitting: Radiation Oncology

## 2023-05-11 NOTE — Progress Notes (Signed)
Radiation Oncology         [phone] ________________________________  Name: Lorraine Yates MRN: [id number]  Date: 05/12/2023  DOB: 1970/08/20  Follow-Up Visit Note  CC: Patient, No Pcp Per  Carver Fila, MD  No diagnosis found.  Diagnosis: Stage IIIc, squamous cell carcinoma of the cervix    Cancer Staging  Cervical cancer (HCC) Staging form: Cervix Uteri, AJCC Version 9 - Clinical stage from 12/02/2022: Stage IIIC1 (cT1b3, cN1, cM0) - Signed by Artis Delay, MD on 12/15/2022  Interval Since Last Radiation: 1 month and 5 days   Indication for treatment: Curative         Radiation treatment dates: 01/20/23 through 04/07/23 ( Pelvic IMRT : 01/20/23 through 03/03/23)  ( Brachytherapy :5 treatment sessions delivered from 03/10/23 through 04/07/23)   Site/dose:   1) Pelvic IMRT - 45 Gy delivered in 25 Fx at 1.8 Gy/Fx  2) Pelvic boost - 9 Gy delivered in 5 Fx at 1.8 Gy/Fx 3) Cervical brachytherapy - 27.5 Gy delivered in 5 Fx at 5.5 Gy/Fx Technique/Mode:  1) IMRT / Photon  2) 3D / Photon  3) HDR Ir-192 / Brachytherapy  Narrative:  The patient returns today for routine follow-up. She tolerated radiation treatment relatively well. During her final weekly treatment check on 03/01/23, the patient endorsed fatigue, diarrhea, urinary urgency, and urinary frequency (on antibiotic for UTI at that time). She denied any other symptoms or concerns.            To review (since her consultation date of 12/15/22), Dr. Bertis Ruddy experienced an excessive delay in getting her molecular studies from pathology to aid in treatment planning. Her report finally resulted in early July and came back positive for PD-L1.   Based on this result and Dr. Maxine Glenn recommendation, she began concurrent chemotherapy (concurrently with radiation) consisting of cisplatin on 01/20/23.  She tolerated her first cycle of chemotherapy relatively well other than loose stools and persistent anemia (present prior to  chemo).  She later developed: pancytopenia and progressive anemia which required her to be transfused (also received IV iron with improvement), and low magnesium (managed with IV supplementation). She received her final dose of cisplatin on 08/15 at a reduced dose, and began maintenance therapy with Keytruda on 03/11/23.   During her most recent visit with Dr. Bertis Ruddy on 04/22/23, the patient presented with c/o new onset right upper quadrant abdominal pain, nausea, and vomiting which would improve with passing gas. Exam performed at that time showed findings suggestive of a possible gallbladder pathology. An abdominal US was subsequently performed on 10/12 which showed a non-shadowing echogenic focus along the fundus of the gallbladder, possibly representing a nonshadowing stone or polyp. No evidence of acute cholecystitis was appreciated.   Despite her symptoms, she opted to receive her dose of [place] that day.   She however presented to the ED on 04/26/23 with worsening right-sided abdominal pain. CT AP performed in the ED showed concern for acute perforated appendicitis as well as a possible developing abscess, and a 5 cm abscess at the caudal tip of the right liver lobe. She was ultimately admitted for management of the abscess and acute perforated appendicitis. Hospital course included CT guided drain placement to address the abscess, zosyn and she was discharged home on 10/18 with a course of Augmentin.   Other pertinent imaging performed in the interval since her initial consultation date includes and MRI of the pelvis on 12/18/22 (pre-chemoradiation) which demonstrated: the bulky mass confined to the  upper third of vagina measuring 7.9 x 6.9 x 5.5 cm, completely effacing the cervical anatomy, and extending into the lower uterine segment. A small left pelvic sidewall lymph node measuring 0.9 x 0.6 cm was also redemonstrated, suspicious for a small site of nodal metastatic disease.    ***  Allergies:  is allergic to demerol [meperidine hcl] and hydromorphone.  Meds: Current Outpatient Medications  Medication Sig Dispense Refill   aluminum-magnesium hydroxide 200-200 MG/5ML suspension Take 30 mLs by mouth as needed (constipation).     amoxicillin-clavulanate (AUGMENTIN) 875-125 MG tablet Take 1 tablet by mouth 2 (two) times daily for 28 days. 56 tablet 0   bisacodyl (DULCOLAX) 5 MG EC tablet Take 15 mg by mouth daily as needed for moderate constipation.     lidocaine-prilocaine (EMLA) cream Apply 1 Application topically as needed. Apply to port site 1 hour to 30 minutes before port is accessed 30 g 1   ondansetron (ZOFRAN) 8 MG tablet Take 8 mg by mouth every 8 (eight) hours as needed for nausea or vomiting.     Oxycodone HCl 10 MG TABS Take 1 tablet (10 mg total) by mouth every 4 (four) hours as needed for severe pain (pain score 7-10). 90 tablet 0   prochlorperazine (COMPAZINE) 10 MG tablet TAKE 1 TABLET(10 MG) BY MOUTH EVERY 6 HOURS AS NEEDED FOR NAUSEA OR VOMITING 30 tablet 1   Sodium Chloride Flush (NORMAL SALINE FLUSH) 0.9 % SOLN Flush abdmonial drain once daily with 5ml sterile saline 300 mL 3   No current facility-administered medications for this encounter.    Physical Findings: The patient is in no acute distress. Patient is alert and oriented.  vitals were not taken for this visit. .  No significant changes. Lungs are clear to auscultation bilaterally. Heart has regular rate and rhythm. No palpable cervical, supraclavicular, or axillary adenopathy. Abdomen soft, non-tender, normal bowel sounds.   Lab Findings: Lab Results  Component Value Date   WBC 3.1 (L) 04/29/2023   HGB 8.4 (L) 04/29/2023   HCT 26.8 (L) 04/29/2023   MCV 101.5 (H) 04/29/2023   PLT 380 04/29/2023    Radiographic Findings: CT GUIDED PERITONEAL/RETROPERITONEAL FLUID DRAIN BY PERC CATH  Result Date: 04/27/2023 INDICATION: Perihepatic abscess EXAM: CT-guided abscess drain  placement TECHNIQUE: Multidetector CT imaging of the abdomen was performed following the standard protocol without IV contrast. RADIATION DOSE REDUCTION: This exam was performed according to the departmental dose-optimization program which includes automated exposure control, adjustment of the mA and/or kV according to patient size and/or use of iterative reconstruction technique. MEDICATIONS: The patient is currently admitted to the hospital and receiving intravenous antibiotics. The antibiotics were administered within an appropriate time frame prior to the initiation of the procedure. ANESTHESIA/SEDATION: Moderate (conscious) sedation was employed during this procedure. A total of Versed 4 mg and Fentanyl 200 mcg was administered intravenously by the radiology nurse. Total intra-service moderate Sedation Time: 20 minutes. The patient's level of consciousness and vital signs were monitored continuously by radiology nursing throughout the procedure under my direct supervision. COMPLICATIONS: None immediate. PROCEDURE: Informed written consent was obtained from the patient after a thorough discussion of the procedural risks, benefits and alternatives. All questions were addressed. Maximal Sterile Barrier Technique was utilized including caps, mask, sterile gowns, sterile gloves, sterile drape, hand hygiene and skin antiseptic. A timeout was performed prior to the initiation of the procedure. Patient positioned left lateral decubitus on the procedure table. Right lateral abdominal skin prepped and draped in usual fashion.  Following local administration, the perihepatic abscess was accessed with 19 gauge Yueh needle utilizing CT guidance. The Yueh catheter was exchanged for a 10.2 [place] multipurpose pigtail drain over 0.035 inch guidewire. Post placement CT confirmed appropriate positioning of the drain. 30 mL of purulent materia l was aspirated. Samples were sent for Gram stain and culture. Drain secured to skin  with suture and connected to bulb. IMPRESSION: Successful CT-guided perihepatic abscess drain placement (10.2 [place]). Electronically Signed   By: Acquanetta Belling M.D.   On: 04/27/2023 16:24   US Abdomen Limited RUQ (LIVER/GB)  Result Date: 04/26/2023 CLINICAL DATA:  Abdominal pain EXAM: ULTRASOUND ABDOMEN LIMITED RIGHT UPPER QUADRANT COMPARISON:  Same day CT FINDINGS: Gallbladder: Mild gallbladder wall thickening. No gallstones visualized. No sonographic Murphy sign noted by sonographer. Gallbladder polyp measuring 7 mm with thick. Common bile duct: Diameter: 7 mm Liver: Heterogeneous of the inferior right lobe of the liver of the liver with echogenic component and dirty shadowing measuring 5.1 x 3.2 x 4.6 cm. Within normal limits in parenchymal echogenicity. Portal vein is patent on color Doppler imaging with normal direction of blood flow towards the liver. Other: None. IMPRESSION: 1. Mild gallbladder wall thickening without gallstones visualized and negative sonographic Murphy sign. Findings are nonspecific and can be seen in the setting of cholecystitis, liver disease, or fluid overload states. 2. Heterogeneous lesion of the inferior right lobe of the liver, correlates with liver abscess seen on same day CT. 3. Mildly dilated common bile duct. Correlate with liver function tests. If LFTs are abnormal, consider further evaluation with MRCP. 4. Gallbladder polyp measuring 7 mm. Recommend follow-up gallbladder ultrasound in 12 months. Electronically Signed   By: Allegra Lai M.D.   On: 04/26/2023 19:50   CT ABDOMEN PELVIS W CONTRAST  Result Date: 04/26/2023 CLINICAL DATA:  Abdominal pain, acute, nonlocalized. Right flank pain over the last week. EXAM: CT ABDOMEN AND PELVIS WITH CONTRAST TECHNIQUE: Multidetector CT imaging of the abdomen and pelvis was performed using the standard protocol following bolus administration of intravenous contrast. RADIATION DOSE REDUCTION: This exam was performed according  to the departmental dose-optimization program which includes automated exposure control, adjustment of the mA and/or kV according to patient size and/or use of iterative reconstruction technique. CONTRAST:  OMNIPAQUE IOHEXOL 300 MG/ML  SOLN COMPARISON:  Ultrasound 2 days ago. FINDINGS: Lower chest: Mild linear scar or atelectasis at the right lung base. No pleural fluid. Hepatobiliary: No calcified gallstones or CT evidence of cholecystitis. Hepatomegaly. Liver abscess at the caudal tip of the right lobe with an air-fluid level measuring up to 5 cm in diameter. Adjacent appendicitis, presumably the etiology. See below. Pancreas: Normal Spleen: Normal Adrenals/Urinary Tract: Adrenal glands are normal. Kidneys are normal. Bladder is normal. Stomach/Bowel: Stomach and small intestine are normal. As noted above, there is acute appendicitis. The appendix is located in a retrocecal location, extending back up to the caudal tip of the liver. The appendix is perforated in this location and there is some phlegmonous inflammation, possibly with a small developing abscess measuring about 12 mm. This is adjacent and in contact with the liver edge and quite likely the etiology of the liver abscess. Vascular/Lymphatic: Aorta and IVC are normal.  No adenopathy. Reproductive: No pelvic organ pathology. Other: No free fluid or air. Musculoskeletal: Normal IMPRESSION: Acute appendicitis. The appendix is located in a retrocecal location, extending back up to the caudal tip of the liver. The appendix is perforated in this location and there is some phlegmonous  inflammation, possibly with a small developing abscess measuring about 12 mm. This is adjacent and in contact with the liver edge. There is a liver abscess at the caudal tip of the right lobe measuring up to 5 cm in size with a gas fluid level. The appendicitis is quite likely the etiology of the liver abscess. Electronically Signed   By: Paulina Fusi M.D.   On: 04/26/2023  17:41   US Abdomen Limited RUQ (LIVER/GB)  Result Date: 04/23/2023 CLINICAL DATA:  Severe right upper quadrant pain for 4 days. Cervical cancer. EXAM: ULTRASOUND ABDOMEN LIMITED RIGHT UPPER QUADRANT COMPARISON:  None Available. FINDINGS: Gallbladder: A nonshadowing echogenic focus along the fundus of the gallbladder measures 6 mm. No hyperemia or ring down artifact is present. Gallbladder wall is of normal thickness at 2 mm. No sonographic Eulah Pont sign is reported. Common bile duct: Diameter: 6.0 mm, upper limits of normal. Liver: No focal lesion identified. Within normal limits in parenchymal echogenicity. Portal vein is patent on color Doppler imaging with normal direction of blood flow towards the liver. Other: None. IMPRESSION: 1. Nonshadowing echogenic focus along the fundus of the gallbladder may represent a nonshadowing stone or polyp. No evidence for acute cholecystitis. 2. Common bile duct is upper limits of normal at 6.0 mm. Electronically Signed   By: Marin Roberts M.D.   On: 04/23/2023 09:45    Impression:  Stage IIIc, squamous cell carcinoma of the cervix   The patient is recovering from the effects of radiation.  ***  Plan:  ***   *** minutes of total time was spent for this patient encounter, including preparation, face-to-face counseling with the patient and coordination of care, physical exam, and documentation of the encounter. ____________________________________  Billie Lade, PhD, MD  This document serves as a record of services personally performed by Antony Blackbird, MD. It was created on his behalf by Neena Rhymes, a trained medical scribe. The creation of this record is based on the scribe's personal observations and the provider's statements to them. This document has been checked and approved by the attending provider.

## 2023-05-11 NOTE — Progress Notes (Signed)
Radiation Oncology         [phone] ________________________________  Name: Lorraine Yates MRN: [id number]  Date: 05/12/2023  DOB: 07-11-1971  End of Treatment Note  Diagnosis: Stage IIIc, squamous cell carcinoma of the cervix   Cancer Staging  Cervical cancer [facility]) Staging form: Cervix Uteri, AJCC Version 9 - Clinical stage from 12/02/2022: Stage IIIC1 (cT1b3, cN1, cM0) - Signed by Artis Delay, MD on 12/15/2022     Indication for treatment: Curative        Radiation treatment dates: 01/20/23 through 04/07/23 ( Pelvic IMRT : 01/20/23 through 03/03/23)  ( Brachytherapy :5 treatment sessions delivered from 03/10/23 through 04/07/23)  Site/dose:   1) Pelvic IMRT - 45 Gy delivered in 25 Fx at 1.8 Gy/Fx  2) Pelvic boost - 9 Gy delivered in 5 Fx at 1.8 Gy/Fx 3) Cervical brachytherapy - 27.5 Gy delivered in 5 Fx at 5/5 Gy/Fx   Technique/Mode:  1) IMRT / Photon  2) 3D / Photon  3) HDR Ir-192 / Brachytherapy   Narrative: The patient tolerated radiation treatment relatively well. During her final weekly treatment check on 03/01/23, the patient endorsed fatigue, diarrhea, urinary urgency, and urinary frequency (on antibiotic for UTI at that time). She denied any other symptoms or concerns.   Plan: The patient has completed radiation treatment. The patient will return to radiation oncology clinic for routine followup in one month. I advised them to call or return sooner if they have any questions or concerns related to their recovery or treatment.  -----------------------------------  Billie Lade, PhD, MD  This document serves as a record of services personally performed by Antony Blackbird, MD. It was created on his behalf by Neena Rhymes, a trained medical scribe. The creation of this record is based on the scribe's personal observations and the provider's statements to them. This document has been checked and approved by the attending provider.

## 2023-05-12 ENCOUNTER — Ambulatory Visit
Admission: RE | Admit: 2023-05-12 | Discharge: 2023-05-12 | Disposition: A | Discharge status: Home / Self Care | Payer: BC Managed Care – PPO | Source: Ambulatory Visit | Attending: Radiation Oncology | Admitting: Radiation Oncology

## 2023-05-12 ENCOUNTER — Encounter: Payer: Self-pay | Admitting: Hematology and Oncology

## 2023-05-12 ENCOUNTER — Inpatient Hospital Stay: Payer: BC Managed Care – PPO

## 2023-05-12 ENCOUNTER — Encounter: Payer: Self-pay | Admitting: Radiation Oncology

## 2023-05-12 ENCOUNTER — Inpatient Hospital Stay (HOSPITAL_BASED_OUTPATIENT_CLINIC_OR_DEPARTMENT_OTHER): Payer: BC Managed Care – PPO | Admitting: Hematology and Oncology

## 2023-05-12 VITALS — BP 116/84 | HR 89 | Temp 98.8°F | Resp 18 | Ht 63.5 in | Wt 143.8 lb

## 2023-05-12 VITALS — BP 124/88 | HR 77 | Temp 97.9°F | Resp 18 | Ht 63.5 in | Wt 146.0 lb

## 2023-05-12 DIAGNOSIS — K3532 Acute appendicitis with perforation and localized peritonitis, without abscess: Secondary | ICD-10-CM

## 2023-05-12 DIAGNOSIS — Z923 Personal history of irradiation: Secondary | ICD-10-CM | POA: Insufficient documentation

## 2023-05-12 DIAGNOSIS — C531 Malignant neoplasm of exocervix: Secondary | ICD-10-CM

## 2023-05-12 DIAGNOSIS — D508 Other iron deficiency anemias: Secondary | ICD-10-CM

## 2023-05-12 DIAGNOSIS — Z720 Tobacco use: Secondary | ICD-10-CM | POA: Diagnosis not present

## 2023-05-12 DIAGNOSIS — C539 Malignant neoplasm of cervix uteri, unspecified: Secondary | ICD-10-CM | POA: Diagnosis present

## 2023-05-12 DIAGNOSIS — Z5112 Encounter for antineoplastic immunotherapy: Secondary | ICD-10-CM | POA: Diagnosis present

## 2023-05-12 DIAGNOSIS — Z79620 Long term (current) use of immunosuppressive biologic: Secondary | ICD-10-CM | POA: Diagnosis not present

## 2023-05-12 DIAGNOSIS — N888 Other specified noninflammatory disorders of cervix uteri: Secondary | ICD-10-CM

## 2023-05-12 DIAGNOSIS — F17210 Nicotine dependence, cigarettes, uncomplicated: Secondary | ICD-10-CM | POA: Diagnosis not present

## 2023-05-12 DIAGNOSIS — D61818 Other pancytopenia: Secondary | ICD-10-CM | POA: Diagnosis not present

## 2023-05-12 DIAGNOSIS — N939 Abnormal uterine and vaginal bleeding, unspecified: Secondary | ICD-10-CM

## 2023-05-12 DIAGNOSIS — E860 Dehydration: Secondary | ICD-10-CM | POA: Diagnosis not present

## 2023-05-12 DIAGNOSIS — C538 Malignant neoplasm of overlapping sites of cervix uteri: Secondary | ICD-10-CM

## 2023-05-12 HISTORY — DX: Personal history of irradiation: Z92.3

## 2023-05-12 LAB — CBC WITH DIFFERENTIAL (CANCER CENTER ONLY)
Abs Immature Granulocytes: 0 10*3/uL (ref 0.00–0.07)
Basophils Absolute: 0 10*3/uL (ref 0.0–0.1)
Basophils Relative: 1 %
Eosinophils Absolute: 0.1 10*3/uL (ref 0.0–0.5)
Eosinophils Relative: 2 %
HCT: 29.6 % — ABNORMAL LOW (ref 36.0–46.0)
Hemoglobin: 9.7 g/dL — ABNORMAL LOW (ref 12.0–15.0)
Immature Granulocytes: 0 %
Lymphocytes Relative: 23 %
Lymphs Abs: 0.6 10*3/uL — ABNORMAL LOW (ref 0.7–4.0)
MCH: 32.9 pg (ref 26.0–34.0)
MCHC: 32.8 g/dL (ref 30.0–36.0)
MCV: 100.3 fL — ABNORMAL HIGH (ref 80.0–100.0)
Monocytes Absolute: 0.3 10*3/uL (ref 0.1–1.0)
Monocytes Relative: 12 %
Neutro Abs: 1.7 10*3/uL (ref 1.7–7.7)
Neutrophils Relative %: 62 %
Platelet Count: 325 10*3/uL (ref 150–400)
RBC: 2.95 MIL/uL — ABNORMAL LOW (ref 3.87–5.11)
RDW: 15.6 % — ABNORMAL HIGH (ref 11.5–15.5)
WBC Count: 2.7 10*3/uL — ABNORMAL LOW (ref 4.0–10.5)
nRBC: 0 % (ref 0.0–0.2)

## 2023-05-12 LAB — CMP (CANCER CENTER ONLY)
ALT: 5 U/L (ref 0–44)
AST: 13 U/L — ABNORMAL LOW (ref 15–41)
Albumin: 3.6 g/dL (ref 3.5–5.0)
Alkaline Phosphatase: 100 U/L (ref 38–126)
Anion gap: 5 (ref 5–15)
BUN: 17 mg/dL (ref 6–20)
CO2: 30 mmol/L (ref 22–32)
Calcium: 9.5 mg/dL (ref 8.9–10.3)
Chloride: 106 mmol/L (ref 98–111)
Creatinine: 0.71 mg/dL (ref 0.44–1.00)
GFR, Estimated: >60 mL/min (ref >60)
Glucose, Bld: 103 mg/dL — ABNORMAL HIGH (ref 70–99)
Potassium: 3.9 mmol/L (ref 3.5–5.1)
Sodium: 141 mmol/L (ref 135–145)
Total Bilirubin: 0.3 mg/dL (ref 0.3–1.2)
Total Protein: 7.2 g/dL (ref 6.5–8.1)

## 2023-05-12 LAB — TSH: TSH: 0.274 u[IU]/mL — ABNORMAL LOW (ref 0.350–4.500)

## 2023-05-12 MED ORDER — SODIUM CHLORIDE 0.9 % IV SOLN: Freq: Once | INTRAVENOUS | Status: AC

## 2023-05-12 MED ORDER — SODIUM CHLORIDE 0.9% FLUSH
10.0000 mL | Freq: Once | INTRAVENOUS | Status: AC
  Administered 2023-05-12: 10 mL

## 2023-05-12 MED ORDER — SODIUM CHLORIDE 0.9 % IV SOLN
400.0000 mg | Freq: Once | INTRAVENOUS | Status: AC
  Administered 2023-05-12: 400 mg via INTRAVENOUS
  Filled 2023-05-12: qty 16

## 2023-05-12 NOTE — Assessment & Plan Note (Signed)
We discussed the importance of tobacco cessation in anticipation for future surgery

## 2023-05-12 NOTE — Assessment & Plan Note (Signed)
She will proceed with surgery next month as indicated

## 2023-05-12 NOTE — Patient Instructions (Signed)

## 2023-05-12 NOTE — Assessment & Plan Note (Addendum)
She will continue immunotherapy as scheduled She will complete surgery next month for recent perforated appendicitis Her pet imaging study will be scheduled to be done at the end of December

## 2023-05-12 NOTE — Assessment & Plan Note (Signed)
This is due to recent treatment and infection Observe closely for now

## 2023-05-12 NOTE — Progress Notes (Signed)
Lorraine Yates is here today for follow up post radiation to the pelvic.  They completed their radiation on: 04/07/2023  Does the patient complain of any of the following:  Pain: No Abdominal bloating: No Diarrhea/Constipation: No Nausea/Vomiting: No Vaginal Discharge: No Blood in Urine or Stool: No Urinary Issues (dysuria/incomplete emptying/ incontinence/ increased frequency/urgency): No Does patient report using vaginal dilator 2-3 times a week and/or sexually active 2-3 weeks: Patient was educated on the use of dilators today 05/11/2024. Post radiation skin changes: She reports a noticeable change in her skin color.  BP 116/84   Pulse 89   Temp 98.8 F (37.1 C)   Resp 18   Ht 5' 3.5" (1.613 m)   Wt 143 lb 12.8 oz (65.2 kg)   SpO2 100%   BMI 25.07 kg/m

## 2023-05-12 NOTE — Progress Notes (Signed)
[facility] OFFICE PROGRESS NOTE  Patient Care Team: Patient, No Pcp Per as PCP - General (General Practice) Artis Delay, MD as Consulting Physician (Hematology and Oncology) Carver Fila, MD as Consulting Physician (Gynecologic Oncology)  ASSESSMENT & PLAN:  Cervical cancer [facility]) She will continue immunotherapy as scheduled She will complete surgery next month for recent perforated appendicitis Her pet imaging study will be scheduled to be done at the end of December  Pancytopenia, acquired [facility]) This is due to recent treatment and infection Observe closely for now  Tobacco abuse We discussed the importance of tobacco cessation in anticipation for future surgery  Perforated appendicitis She will proceed with surgery next month as indicated  No orders of the defined types were placed in this encounter.   All questions were answered. The patient knows to call the clinic with any problems, questions or concerns. The total time spent in the appointment was 30 minutes encounter with patients including review of chart and various tests results, discussions about plan of care and coordination of care plan   Artis Delay, MD 05/12/2023 2:55 PM  INTERVAL HISTORY: Please see below for problem oriented charting. she returns for treatment and follow-up She is doing well since recent hospital discharge She has minimum pain Her appetite is improving Denies recent nausea or changes in bowel habits  REVIEW OF SYSTEMS:   Constitutional: Denies fevers, chills or abnormal weight loss Eyes: Denies blurriness of vision Ears, nose, mouth, throat, and face: Denies mucositis or sore throat Respiratory: Denies cough, dyspnea or wheezes Cardiovascular: Denies palpitation, chest discomfort or lower extremity swelling Gastrointestinal:  Denies nausea, heartburn or change in bowel habits Skin: Denies abnormal skin rashes Lymphatics: Denies new lymphadenopathy or easy  bruising Neurological:Denies numbness, tingling or new weaknesses Behavioral/Psych: Mood is stable, no new changes  All other systems were reviewed with the patient and are negative.  I have reviewed the past medical history, past surgical history, social history and family history with the patient and they are unchanged from previous note.  ALLERGIES:  is allergic to demerol [meperidine hcl] and hydromorphone.  MEDICATIONS:  Current Outpatient Medications  Medication Sig Dispense Refill   amoxicillin-clavulanate (AUGMENTIN) 875-125 MG tablet Take 1 tablet by mouth 2 (two) times daily for 28 days. 56 tablet 0   lidocaine-prilocaine (EMLA) cream Apply 1 Application topically as needed. Apply to port site 1 hour to 30 minutes before port is accessed 30 g 1   ondansetron (ZOFRAN) 8 MG tablet Take 8 mg by mouth every 8 (eight) hours as needed for nausea or vomiting.     Oxycodone HCl 10 MG TABS Take 1 tablet (10 mg total) by mouth every 4 (four) hours as needed for severe pain (pain score 7-10). 90 tablet 0   prochlorperazine (COMPAZINE) 10 MG tablet TAKE 1 TABLET(10 MG) BY MOUTH EVERY 6 HOURS AS NEEDED FOR NAUSEA OR VOMITING 30 tablet 1   Sodium Chloride Flush (NORMAL SALINE FLUSH) 0.9 % SOLN Flush abdmonial drain once daily with 5ml sterile saline 300 mL 3   No current facility-administered medications for this visit.    SUMMARY OF ONCOLOGIC HISTORY: Oncology History Overview Note  PD-L1 CPS 1%   Cervical cancer (HCC)  11/24/2022 Initial Diagnosis   The patient presented recently to the emergency department with both vaginal bleeding and rectal bleeding.  Exam in the emergency department showed a 6 cm irregular cervical mass.     11/24/2022 Imaging   CT pelvis 1. Heterogeneous cervical mass  is highly worrisome for cervical carcinoma. Significant border of contact with the adjacent rectum. Difficult to exclude invasion. 2. Locule of air within the cervical mass may be due to necrosis or  extension from the vagina. Difficult to exclude a fistula to the rectum.   11/24/2022 Imaging   US pelvis 1. Large, approximately 7.6 cm vascular soft tissue mass of the lower uterine segment. This seems inseparable from the cervix and Cervical Carcinoma is not excluded. Alternatively this might be a large lower uterine fibroid. Recommend direct visualization of the cervix.   2. Elsewhere the uterus and endometrium are within normal limits. Normal ovaries and no free fluid.   11/26/2022 Pathology Results   SURGICAL PATHOLOGY  CASE: [id number]  PATIENT: Lorraine Yates  Surgical Pathology Report   FINAL MICROSCOPIC DIAGNOSIS:   A. 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  See comment.   COMMENT:   - Morphologic evaluation and p16 immunohistochemical stain reveal superficial squamous mucosa with findings consistent with high-grade squamous intraepithelial lesion.  The patient's clinical history of a cervical mass is noted, and a deeper more invasive process cannot be excluded.  Clinical and imaging correlation is recommended.      11/29/2022 Initial Diagnosis   Cervical cancer (HCC)   12/02/2022 Cancer Staging   Staging form: Cervix Uteri, AJCC Version 9 - Clinical stage from 12/02/2022: Stage IIIC1 (cT1b3, cN1, cM0) - Signed by Artis Delay, MD on 12/15/2022 Stage prefix: Initial diagnosis   12/15/2022 PET scan   1. Hypermetabolic cervical mass consistent with known cervical carcinoma. 2. There is a mildly FDG avid left pelvic sidewall lymph node which is equivocal for nodal metastasis. 3. No additional sites of disease identified. 4. Aortic Atherosclerosis (ICD10-I70.0).     12/16/2022 Procedure   Successful placement of a right internal jugular approach power injectable Port-A-Cath. The catheter is ready for immediate use.     12/17/2022 Pathology Results   SURGICAL PATHOLOGY  CASE: [id number]  PATIENT: Lorraine Yates  Surgical Pathology Report    Clinical History: malignant neoplasm of cervix, unspecified site   FINAL MICROSCOPIC DIAGNOSIS:   A. 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     12/18/2022 Imaging   1. The normal cervical anatomy is completely effaced by a bulky mass measuring 7.9 x 6.9 x 5.5 cm. Mass extends into the lower uterine segment with loss of parametrial definition about the posterior 180 degrees of the upper portion of the mass. Mass remains confined to the upper third of the vagina. 2. Preserved fat planes to the adjacent bladder and rectum. No ureteral invasion or hydronephrosis. 3. Small left pelvic sidewall lymph node measuring 0.9 x 0.6 cm, previously with equivocal FDG avidity and suspicious for a small nodal metastasis. No other enlarged lymph nodes in the pelvis.     01/20/2023 - 01/20/2023 Chemotherapy   Patient is on Treatment Plan : HEAD/NECK Cisplatin (40) q7d     01/20/2023 -  Chemotherapy   Patient is on Treatment Plan : Cervical Pembrolizumab (200) q21d, cisplatin (40) q7d + XRT / pembrolizumab (400) q42d      01/20/2023 - 04/07/2023 Radiation Therapy   Radiation treatment dates: 01/20/23 through 04/07/23 ( Pelvic IMRT : 01/20/23 through 03/03/23)  ( Brachytherapy :5 treatment sessions delivered from 03/10/23 through 04/07/23)   Site/dose:   1) Pelvic IMRT - 45 Gy delivered in 25  Fx at 1.8 Gy/Fx  2) Pelvic boost - 9 Gy delivered in 5 Fx at 1.8 Gy/Fx 3) Cervical brachytherapy - 27.5 Gy delivered in 5 Fx at 5/5 Gy/Fx    Technique/Mode:  1) IMRT / Photon  2) 3D / Photon  3) HDR Ir-192 / Brachytherapy    04/23/2023 Imaging   1. Nonshadowing echogenic focus along the fundus of the gallbladder may represent a nonshadowing stone or polyp. No evidence for acute cholecystitis. 2. Common bile duct is upper limits of normal at 6.0 mm.     PHYSICAL  EXAMINATION: ECOG PERFORMANCE STATUS: 1 - Symptomatic but completely ambulatory  Vitals:   05/12/23 1154  BP: 124/88  Pulse: 77  Resp: 18  Temp: 97.9 F (36.6 C)  SpO2: 100%   Filed Weights   05/12/23 1154  Weight: 146 lb (66.2 kg)    GENERAL:alert, no distress and comfortable SKIN: skin color, texture, turgor are normal, no rashes or significant lesions EYES: normal, Conjunctiva are pink and non-injected, sclera clear OROPHARYNX:no exudate, no erythema and lips, buccal mucosa, and tongue normal  NECK: supple, thyroid normal size, non-tender, without nodularity LYMPH:  no palpable lymphadenopathy in the cervical, axillary or inguinal LUNGS: clear to auscultation and percussion with normal breathing effort HEART: regular rate & rhythm and no murmurs and no lower extremity edema ABDOMEN:abdomen soft, non-tender and normal bowel sounds Musculoskeletal:no cyanosis of digits and no clubbing  NEURO: alert & oriented x 3 with fluent speech, no focal motor/sensory deficits  LABORATORY DATA:  I have reviewed the data as listed    Component Value Date/Time   NA 141 05/12/2023 1128   K 3.9 05/12/2023 1128   CL 106 05/12/2023 1128   CO2 30 05/12/2023 1128   GLUCOSE 103 (H) 05/12/2023 1128   BUN 17 05/12/2023 1128   CREATININE 0.71 05/12/2023 1128   CALCIUM 9.5 05/12/2023 1128   PROT 7.2 05/12/2023 1128   ALBUMIN 3.6 05/12/2023 1128   AST 13 (L) 05/12/2023 1128   ALT 5 05/12/2023 1128   ALKPHOS 100 05/12/2023 1128   BILITOT 0.3 05/12/2023 1128   GFRNONAA >60 05/12/2023 1128    No results found for: "SPEP", "UPEP"  Lab Results  Component Value Date   WBC 2.7 (L) 05/12/2023   NEUTROABS 1.7 05/12/2023   HGB 9.7 (L) 05/12/2023   HCT 29.6 (L) 05/12/2023   MCV 100.3 (H) 05/12/2023   PLT 325 05/12/2023      Chemistry      Component Value Date/Time   NA 141 05/12/2023 1128   K 3.9 05/12/2023 1128   CL 106 05/12/2023 1128   CO2 30 05/12/2023 1128   BUN 17 05/12/2023  1128   CREATININE 0.71 05/12/2023 1128      Component Value Date/Time   CALCIUM 9.5 05/12/2023 1128   ALKPHOS 100 05/12/2023 1128   AST 13 (L) 05/12/2023 1128   ALT 5 05/12/2023 1128   BILITOT 0.3 05/12/2023 1128       RADIOGRAPHIC STUDIES: I have personally reviewed the radiological images as listed and agreed with the findings in the report. CT GUIDED PERITONEAL/RETROPERITONEAL FLUID DRAIN BY PERC CATH  Result Date: 04/27/2023 INDICATION: Perihepatic abscess EXAM: CT-guided abscess drain placement TECHNIQUE: Multidetector CT imaging of the abdomen was performed following the standard protocol without IV contrast. RADIATION DOSE REDUCTION: This exam was performed according to the departmental dose-optimization program which includes automated exposure control, adjustment of the mA and/or kV according to patient size and/or use of iterative reconstruction  technique. MEDICATIONS: The patient is currently admitted to the hospital and receiving intravenous antibiotics. The antibiotics were administered within an appropriate time frame prior to the initiation of the procedure. ANESTHESIA/SEDATION: Moderate (conscious) sedation was employed during this procedure. A total of Versed 4 mg and Fentanyl 200 mcg was administered intravenously by the radiology nurse. Total intra-service moderate Sedation Time: 20 minutes. The patient's level of consciousness and vital signs were monitored continuously by radiology nursing throughout the procedure under my direct supervision. COMPLICATIONS: None immediate. PROCEDURE: Informed written consent was obtained from the patient after a thorough discussion of the procedural risks, benefits and alternatives. All questions were addressed. Maximal Sterile Barrier Technique was utilized including caps, mask, sterile gowns, sterile gloves, sterile drape, hand hygiene and skin antiseptic. A timeout was performed prior to the initiation of the procedure. Patient positioned  left lateral decubitus on the procedure table. Right lateral abdominal skin prepped and draped in usual fashion. Following local administration, the perihepatic abscess was accessed with 19 gauge Yueh needle utilizing CT guidance. The Yueh catheter was exchanged for a 10.2 [place] multipurpose pigtail drain over 0.035 inch guidewire. Post placement CT confirmed appropriate positioning of the drain. 30 mL of purulent materia l was aspirated. Samples were sent for Gram stain and culture. Drain secured to skin with suture and connected to bulb. IMPRESSION: Successful CT-guided perihepatic abscess drain placement (10.2 [place]). Electronically Signed   By: Acquanetta Belling M.D.   On: 04/27/2023 16:24   US Abdomen Limited RUQ (LIVER/GB)  Result Date: 04/26/2023 CLINICAL DATA:  Abdominal pain EXAM: ULTRASOUND ABDOMEN LIMITED RIGHT UPPER QUADRANT COMPARISON:  Same day CT FINDINGS: Gallbladder: Mild gallbladder wall thickening. No gallstones visualized. No sonographic Murphy sign noted by sonographer. Gallbladder polyp measuring 7 mm with thick. Common bile duct: Diameter: 7 mm Liver: Heterogeneous of the inferior right lobe of the liver of the liver with echogenic component and dirty shadowing measuring 5.1 x 3.2 x 4.6 cm. Within normal limits in parenchymal echogenicity. Portal vein is patent on color Doppler imaging with normal direction of blood flow towards the liver. Other: None. IMPRESSION: 1. Mild gallbladder wall thickening without gallstones visualized and negative sonographic Murphy sign. Findings are nonspecific and can be seen in the setting of cholecystitis, liver disease, or fluid overload states. 2. Heterogeneous lesion of the inferior right lobe of the liver, correlates with liver abscess seen on same day CT. 3. Mildly dilated common bile duct. Correlate with liver function tests. If LFTs are abnormal, consider further evaluation with MRCP. 4. Gallbladder polyp measuring 7 mm. Recommend follow-up gallbladder  ultrasound in 12 months. Electronically Signed   By: Allegra Lai M.D.   On: 04/26/2023 19:50   CT ABDOMEN PELVIS W CONTRAST  Result Date: 04/26/2023 CLINICAL DATA:  Abdominal pain, acute, nonlocalized. Right flank pain over the last week. EXAM: CT ABDOMEN AND PELVIS WITH CONTRAST TECHNIQUE: Multidetector CT imaging of the abdomen and pelvis was performed using the standard protocol following bolus administration of intravenous contrast. RADIATION DOSE REDUCTION: This exam was performed according to the departmental dose-optimization program which includes automated exposure control, adjustment of the mA and/or kV according to patient size and/or use of iterative reconstruction technique. CONTRAST:  OMNIPAQUE IOHEXOL 300 MG/ML  SOLN COMPARISON:  Ultrasound 2 days ago. FINDINGS: Lower chest: Mild linear scar or atelectasis at the right lung base. No pleural fluid. Hepatobiliary: No calcified gallstones or CT evidence of cholecystitis. Hepatomegaly. Liver abscess at the caudal tip of the right lobe with an  air-fluid level measuring up to 5 cm in diameter. Adjacent appendicitis, presumably the etiology. See below. Pancreas: Normal Spleen: Normal Adrenals/Urinary Tract: Adrenal glands are normal. Kidneys are normal. Bladder is normal. Stomach/Bowel: Stomach and small intestine are normal. As noted above, there is acute appendicitis. The appendix is located in a retrocecal location, extending back up to the caudal tip of the liver. The appendix is perforated in this location and there is some phlegmonous inflammation, possibly with a small developing abscess measuring about 12 mm. This is adjacent and in contact with the liver edge and quite likely the etiology of the liver abscess. Vascular/Lymphatic: Aorta and IVC are normal.  No adenopathy. Reproductive: No pelvic organ pathology. Other: No free fluid or air. Musculoskeletal: Normal IMPRESSION: Acute appendicitis. The appendix is located in a retrocecal  location, extending back up to the caudal tip of the liver. The appendix is perforated in this location and there is some phlegmonous inflammation, possibly with a small developing abscess measuring about 12 mm. This is adjacent and in contact with the liver edge. There is a liver abscess at the caudal tip of the right lobe measuring up to 5 cm in size with a gas fluid level. The appendicitis is quite likely the etiology of the liver abscess. Electronically Signed   By: Paulina Fusi M.D.   On: 04/26/2023 17:41   US Abdomen Limited RUQ (LIVER/GB)  Result Date: 04/23/2023 CLINICAL DATA:  Severe right upper quadrant pain for 4 days. Cervical cancer. EXAM: ULTRASOUND ABDOMEN LIMITED RIGHT UPPER QUADRANT COMPARISON:  None Available. FINDINGS: Gallbladder: A nonshadowing echogenic focus along the fundus of the gallbladder measures 6 mm. No hyperemia or ring down artifact is present. Gallbladder wall is of normal thickness at 2 mm. No sonographic Eulah Pont sign is reported. Common bile duct: Diameter: 6.0 mm, upper limits of normal. Liver: No focal lesion identified. Within normal limits in parenchymal echogenicity. Portal vein is patent on color Doppler imaging with normal direction of blood flow towards the liver. Other: None. IMPRESSION: 1. Nonshadowing echogenic focus along the fundus of the gallbladder may represent a nonshadowing stone or polyp. No evidence for acute cholecystitis. 2. Common bile duct is upper limits of normal at 6.0 mm. Electronically Signed   By: Marin Roberts M.D.   On: 04/23/2023 09:45

## 2023-05-13 ENCOUNTER — Other Ambulatory Visit: Payer: Self-pay

## 2023-05-13 ENCOUNTER — Telehealth: Payer: Self-pay | Admitting: *Deleted

## 2023-05-13 LAB — T4: T4, Total: 6.8 ug/dL (ref 4.5–12.0)

## 2023-05-13 NOTE — Telephone Encounter (Signed)
Talbert Forest from ([facility]) called office.  Pt is scheduled for a follow up on January 3 rd  at 3:30 with Dr. Pricilla Holm.  Talbert Forest to notify pt of appointment date and time.

## 2023-05-13 NOTE — Telephone Encounter (Signed)
Called patient to inform of fu appt. wth Dr.Tucker on 07-15-23- arrival time- 3:15 pm, spoke with patient and she is aware of this appt.

## 2023-05-18 ENCOUNTER — Ambulatory Visit
Admission: RE | Admit: 2023-05-18 | Discharge: 2023-05-18 | Disposition: A | Discharge status: Home / Self Care | Payer: BC Managed Care – PPO | Source: Ambulatory Visit | Attending: Radiology | Admitting: Radiology

## 2023-05-18 ENCOUNTER — Ambulatory Visit
Admission: RE | Admit: 2023-05-18 | Discharge: 2023-05-18 | Disposition: A | Discharge status: Home / Self Care | Payer: BC Managed Care – PPO | Source: Ambulatory Visit | Attending: Surgery | Admitting: Surgery

## 2023-05-18 DIAGNOSIS — K35211 Acute appendicitis with generalized peritonitis, with perforation and abscess: Secondary | ICD-10-CM

## 2023-05-18 HISTORY — PX: IR RADIOLOGIST EVAL & MGMT: IMG5224

## 2023-05-18 MED ORDER — IOPAMIDOL (ISOVUE-300) INJECTION 61%
100.0000 mL | Freq: Once | INTRAVENOUS | Status: AC | PRN
  Administered 2023-05-18: 100 mL via INTRAVENOUS

## 2023-05-18 NOTE — Progress Notes (Signed)
Chief Complaint: Patient was seen in consultation today for follow up after percutaneous catheter drainage of hepatic/perihepatic abscess.  History of Present Illness: Lorraine Yates is a 52 y.o. female status post percutaneous catheter drainage of hepatic/perihepatic abscess related to perforated appendicitis on 04/27/2023. The patient has had no output from the drain over the last several days. She remains on oral antibiotics. She denies fever or abdominal pain. She is tolerating a regular diet.  Past Medical History:  Diagnosis Date   Acquired pancytopenia (HCC)    Anemia    History of cancer chemotherapy    cervical cancer  01-20-2023  to 02-28-2023   History of radiation therapy    Cervix- 01/20/23-04/07/23-Dr. 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   oncologist--- dr gorsuch/  radiation oncologist--- dr Roselind Messier;  dx 05/ 2024;   chemo 01-20-2023 to 02-28-2023;   IMRT 01-20-2023 to 03-03-2023  to start high dose radiation 03-10-2023 w/ tandem   Port-A-Cath in place 12/16/2022    Past Surgical History:  Procedure Laterality Date   IR IMAGING GUIDED PORT INSERTION  12/16/2022   OPERATIVE ULTRASOUND N/A 03/10/2023   Procedure: OPERATIVE ULTRASOUND;  Surgeon: Antony Blackbird, MD;  Location: [facility];  Service: Urology;  Laterality: N/A;   OPERATIVE ULTRASOUND N/A 03/15/2023   Procedure: OPERATIVE ULTRASOUND;  Surgeon: Antony Blackbird, MD;  Location: [facility];  Service: Urology;  Laterality: N/A;   OPERATIVE ULTRASOUND N/A 03/21/2023   Procedure: OPERATIVE ULTRASOUND;  Surgeon: Antony Blackbird, MD;  Location: [facility];  Service: Urology;  Laterality: N/A;   OPERATIVE ULTRASOUND N/A 03/31/2023   Procedure: OPERATIVE ULTRASOUND;  Surgeon: Antony Blackbird, MD;  Location: [facility];  Service: Urology;  Laterality: N/A;   OPERATIVE ULTRASOUND N/A 04/07/2023   Procedure: OPERATIVE  ULTRASOUND;  Surgeon: Antony Blackbird, MD;  Location: [facility];  Service: Urology;  Laterality: N/A;   TANDEM RING INSERTION N/A 03/10/2023   Procedure: TANDEM RING INSERTION;  Surgeon: Antony Blackbird, MD;  Location: [facility];  Service: Urology;  Laterality: N/A;   TANDEM RING INSERTION N/A 03/15/2023   Procedure: TANDEM RING INSERTION;  Surgeon: Antony Blackbird, MD;  Location: [facility];  Service: Urology;  Laterality: N/A;   TANDEM RING INSERTION N/A 03/21/2023   Procedure: TANDEM RING INSERTION;  Surgeon: Antony Blackbird, MD;  Location: [facility];  Service: Urology;  Laterality: N/A;   TANDEM RING INSERTION N/A 03/31/2023   Procedure: TANDEM RING INSERTION;  Surgeon: Antony Blackbird, MD;  Location: [facility];  Service: Urology;  Laterality: N/A;   TANDEM RING INSERTION N/A 04/07/2023   Procedure: TANDEM RING INSERTION;  Surgeon: Antony Blackbird, MD;  Location: [facility];  Service: Urology;  Laterality: N/A;    Allergies: Demerol [meperidine hcl] and Hydromorphone  Medications: Prior to Admission medications   Medication Sig Start Date End Date Taking? Authorizing Provider  amoxicillin-clavulanate (AUGMENTIN) 875-125 MG tablet Take 1 tablet by mouth 2 (two) times daily for 28 days. 04/29/23 05/27/23  Jerald Kief, MD  lidocaine-prilocaine (EMLA) cream Apply 1 Application topically as needed. Apply to port site 1 hour to 30 minutes before port is accessed 12/22/22   Artis Delay, MD  ondansetron (ZOFRAN) 8 MG tablet Take 8 mg by mouth every 8 (eight) hours as needed for nausea or vomiting.    [provider]  Oxycodone HCl 10 MG TABS  Take 1 tablet (10 mg total) by mouth every 4 (four) hours as needed for severe pain (pain score 7-10). 05/02/23   Artis Delay, MD  prochlorperazine (COMPAZINE) 10 MG tablet TAKE 1 TABLET(10 MG) BY MOUTH EVERY 6 HOURS AS NEEDED FOR NAUSEA OR VOMITING 01/21/23    Artis Delay, MD  Sodium Chloride Flush (NORMAL SALINE FLUSH) 0.9 % SOLN Flush abdmonial drain once daily with 5ml sterile saline 04/29/23        Family History  Problem Relation Age of Onset   Alzheimer's disease Mother    Alzheimer's disease Father    Prostate cancer Father    Hypertension Father    Sickle cell trait Sister    Pancreatic cancer Neg Hx    Colon cancer Neg Hx    Breast cancer Neg Hx    Endometrial cancer Neg Hx     Social History   Socioeconomic History   Marital status: Married    Spouse name: Not on file   Number of children: 4   Years of education: Not on file   Highest education level: Not on file  Occupational History   Occupation: personal shopper  Tobacco Use   Smoking status: Every Day    Current packs/day: 0.15    Types: Cigarettes   Smokeless tobacco: Never   Tobacco comments:    03-03-2023  Socially - 1 pack per week,   per pt started smoking age 42,  and has cut down to 1 cig per day trying to quit  Vaping Use   Vaping status: Never Used  Substance and Sexual Activity   Alcohol use: Yes    Comment: socially   Drug use: Yes    Types: Marijuana    Comment: 03-03-2023  per pt average since cancer dx once monthly (previously THC - once q 2 weeks)   Sexual activity: Not Currently    Birth control/protection: None  Other Topics Concern   Not on file  Social History Narrative   Not on file   Social Determinants of Health   Financial Resource Strain: Not on file  Food Insecurity: No Food Insecurity (04/26/2023)   Hunger Vital Sign    Worried About Running Out of Food in the Last Year: Never true    Ran Out of Food in the Last Year: Never true  Transportation Needs: No Transportation Needs (04/26/2023)   PRAPARE - Administrator, Civil Service (Medical): No    Lack of Transportation (Non-Medical): No  Physical Activity: Not on file  Stress: Not on file  Social Connections: Moderately Isolated (11/25/2022)   Social Connection  and Isolation Panel [NHANES]    Frequency of Communication with Friends and Family: More than three times a week    Frequency of Social Gatherings with Friends and Family: More than three times a week    Attends Religious Services: Never    Database administrator or Organizations: No    Attends Banker Meetings: Never    Marital Status: Married     Review of Systems: A 12 point ROS discussed and pertinent positives are indicated in the HPI above.  All other systems are negative.  Review of Systems  Constitutional: Negative.   Respiratory: Negative.    Cardiovascular: Negative.   Gastrointestinal: Negative.   Genitourinary: Negative.   Musculoskeletal: Negative.   Neurological: Negative.     Vital Signs: BP 121/78   Pulse 89   Temp 97.6 F (36.4 C)   Resp 20  SpO2 97%     Physical Exam Vitals reviewed.  Constitutional:      General: She is not in acute distress.    Appearance: Normal appearance. She is not ill-appearing, toxic-appearing or diaphoretic.  Abdominal:     General: There is no distension.     Palpations: Abdomen is soft.     Tenderness: There is no abdominal tenderness. There is no guarding or rebound.     Comments: Right abdominal drain exit site clean and dry. No drainage. No erythema.  Neurological:     Mental Status: She is alert.      Imaging: DG Sinus/Fist Tube Chk-Non GI  Result Date: 05/18/2023 INDICATION: Status post percutaneous catheter drainage of hepatic/perihepatic abscess on 04/27/2023 related to perforated appendicitis. EXAM: INJECTION OF PERCUTANEOUS ABSCESS DRAINAGE CATHETER UNDER FLUOROSCOPY MEDICATIONS: None ANESTHESIA/SEDATION: None CONTRAST:  5 mL Omnipaque 300 FLUOROSCOPY TIME:  48 seconds.  4.2 mGy. COMPLICATIONS: None immediate. PROCEDURE: Informed written consent was obtained from the patient after a thorough discussion of the procedural risks, benefits and alternatives. All questions were addressed. A timeout was  performed prior to the initiation of the procedure. The pre-existing percutaneous drainage catheter was injected with contrast material. Fluoroscopic spot images were saved. Injected contrast was aspirated from the drain and the drain gently irrigated with sterile saline. The drainage catheter was then cut and removed. A dressing was applied over the catheter exit site. FINDINGS: Injection of the percutaneous drain located along the inferior margin of the liver demonstrates filling of a space conforming to the pigtail portion of the drainage catheter with no evidence of fistula to the appendix or liver parenchyma. Given lack of clinical output from the drainage catheter and CT today demonstrating resolution of the abscess, the percutaneous drain was removed today. IMPRESSION: Percutaneous drainage catheter injection demonstrates no evidence of fistula to the appendix or liver parenchyma. The percutaneous drainage catheter was removed following drain injection. Electronically Signed   By: Irish Lack M.D.   On: 05/18/2023 14:03   CT ABDOMEN PELVIS W CONTRAST  Result Date: 05/18/2023 CLINICAL DATA:  Status post percutaneous catheter drainage of hepatic/perihepatic abscess related to perforated appendicitis on 04/27/2023. The patient has had no output from the drain over the last several days. EXAM: CT ABDOMEN AND PELVIS WITH CONTRAST TECHNIQUE: Multidetector CT imaging of the abdomen and pelvis was performed using the standard protocol following bolus administration of intravenous contrast. RADIATION DOSE REDUCTION: This exam was performed according to the departmental dose-optimization program which includes automated exposure control, adjustment of the mA and/or kV according to patient size and/or use of iterative reconstruction technique. CONTRAST:  ISOVUE-300 IOPAMIDOL (ISOVUE-300) INJECTION 61% COMPARISON:  04/26/2023 FINDINGS: Lower chest: No acute abnormality. Hepatobiliary: Normal gallbladder and  bile ducts. Percutaneous drain along the posterior subcapsular aspect of the inferior right lobe of the liver. Based on location and current drain appearance, the previously drained abscess was likely in the perihepatic or subcapsular space and not in the actual liver parenchyma itself. The abscess is completely resolved. No new fluid collections are identified. Pancreas: Unremarkable. No pancreatic ductal dilatation or surrounding inflammatory changes. Spleen: Normal in size without focal abnormality. Adrenals/Urinary Tract: Adrenal glands are unremarkable. Kidneys are normal, without renal calculi, focal lesion, or hydronephrosis. Bladder is unremarkable. Stomach/Bowel: Bowel shows no evidence of obstruction, ileus, inflammation or lesion. No free intraperitoneal air. Mildly thickened and inflamed appendix again noted in a retrocecal location ascending up towards the liver. Maximum appendiceal diameter is approximately 11 mm. There  is some air in the distal appendix. Vascular/Lymphatic: No significant vascular findings are present. No enlarged abdominal or pelvic lymph nodes. Reproductive: Uterus and bilateral adnexa are unremarkable. Other: No abdominal wall hernia or abnormality. No abdominopelvic ascites. Musculoskeletal: No acute or significant osseous findings. IMPRESSION: 1. Percutaneous drain along the posterior subcapsular aspect of the inferior right lobe of the liver. Based on location and current drain appearance, the previously drained abscess was likely in the perihepatic or subcapsular space and not in the actual liver parenchyma itself. The abscess is completely resolved. No new fluid collections are identified. 2. Mildly thickened and inflamed appendix again noted in a retrocecal location ascending up towards the liver. Maximum appendiceal diameter is approximately 11 mm. There is some air in the distal appendix. Electronically Signed   By: Irish Lack M.D.   On: 05/18/2023 14:00   CT GUIDED  PERITONEAL/RETROPERITONEAL FLUID DRAIN BY PERC CATH  Result Date: 04/27/2023 INDICATION: Perihepatic abscess EXAM: CT-guided abscess drain placement TECHNIQUE: Multidetector CT imaging of the abdomen was performed following the standard protocol without IV contrast. RADIATION DOSE REDUCTION: This exam was performed according to the departmental dose-optimization program which includes automated exposure control, adjustment of the mA and/or kV according to patient size and/or use of iterative reconstruction technique. MEDICATIONS: The patient is currently admitted to the hospital and receiving intravenous antibiotics. The antibiotics were administered within an appropriate time frame prior to the initiation of the procedure. ANESTHESIA/SEDATION: Moderate (conscious) sedation was employed during this procedure. A total of Versed 4 mg and Fentanyl 200 mcg was administered intravenously by the radiology nurse. Total intra-service moderate Sedation Time: 20 minutes. The patient's level of consciousness and vital signs were monitored continuously by radiology nursing throughout the procedure under my direct supervision. COMPLICATIONS: None immediate. PROCEDURE: Informed written consent was obtained from the patient after a thorough discussion of the procedural risks, benefits and alternatives. All questions were addressed. Maximal Sterile Barrier Technique was utilized including caps, mask, sterile gowns, sterile gloves, sterile drape, hand hygiene and skin antiseptic. A timeout was performed prior to the initiation of the procedure. Patient positioned left lateral decubitus on the procedure table. Right lateral abdominal skin prepped and draped in usual fashion. Following local administration, the perihepatic abscess was accessed with 19 gauge Yueh needle utilizing CT guidance. The Yueh catheter was exchanged for a 10.2 [place] multipurpose pigtail drain over 0.035 inch guidewire. Post placement CT confirmed  appropriate positioning of the drain. 30 mL of purulent materia l was aspirated. Samples were sent for Gram stain and culture. Drain secured to skin with suture and connected to bulb. IMPRESSION: Successful CT-guided perihepatic abscess drain placement (10.2 [place]). Electronically Signed   By: Acquanetta Belling M.D.   On: 04/27/2023 16:24   US Abdomen Limited RUQ (LIVER/GB)  Result Date: 04/26/2023 CLINICAL DATA:  Abdominal pain EXAM: ULTRASOUND ABDOMEN LIMITED RIGHT UPPER QUADRANT COMPARISON:  Same day CT FINDINGS: Gallbladder: Mild gallbladder wall thickening. No gallstones visualized. No sonographic Murphy sign noted by sonographer. Gallbladder polyp measuring 7 mm with thick. Common bile duct: Diameter: 7 mm Liver: Heterogeneous of the inferior right lobe of the liver of the liver with echogenic component and dirty shadowing measuring 5.1 x 3.2 x 4.6 cm. Within normal limits in parenchymal echogenicity. Portal vein is patent on color Doppler imaging with normal direction of blood flow towards the liver. Other: None. IMPRESSION: 1. Mild gallbladder wall thickening without gallstones visualized and negative sonographic Murphy sign. Findings are nonspecific and can be seen in  the setting of cholecystitis, liver disease, or fluid overload states. 2. Heterogeneous lesion of the inferior right lobe of the liver, correlates with liver abscess seen on same day CT. 3. Mildly dilated common bile duct. Correlate with liver function tests. If LFTs are abnormal, consider further evaluation with MRCP. 4. Gallbladder polyp measuring 7 mm. Recommend follow-up gallbladder ultrasound in 12 months. Electronically Signed   By: Allegra Lai M.D.   On: 04/26/2023 19:50   CT ABDOMEN PELVIS W CONTRAST  Result Date: 04/26/2023 CLINICAL DATA:  Abdominal pain, acute, nonlocalized. Right flank pain over the last week. EXAM: CT ABDOMEN AND PELVIS WITH CONTRAST TECHNIQUE: Multidetector CT imaging of the abdomen and pelvis was  performed using the standard protocol following bolus administration of intravenous contrast. RADIATION DOSE REDUCTION: This exam was performed according to the departmental dose-optimization program which includes automated exposure control, adjustment of the mA and/or kV according to patient size and/or use of iterative reconstruction technique. CONTRAST:  OMNIPAQUE IOHEXOL 300 MG/ML  SOLN COMPARISON:  Ultrasound 2 days ago. FINDINGS: Lower chest: Mild linear scar or atelectasis at the right lung base. No pleural fluid. Hepatobiliary: No calcified gallstones or CT evidence of cholecystitis. Hepatomegaly. Liver abscess at the caudal tip of the right lobe with an air-fluid level measuring up to 5 cm in diameter. Adjacent appendicitis, presumably the etiology. See below. Pancreas: Normal Spleen: Normal Adrenals/Urinary Tract: Adrenal glands are normal. Kidneys are normal. Bladder is normal. Stomach/Bowel: Stomach and small intestine are normal. As noted above, there is acute appendicitis. The appendix is located in a retrocecal location, extending back up to the caudal tip of the liver. The appendix is perforated in this location and there is some phlegmonous inflammation, possibly with a small developing abscess measuring about 12 mm. This is adjacent and in contact with the liver edge and quite likely the etiology of the liver abscess. Vascular/Lymphatic: Aorta and IVC are normal.  No adenopathy. Reproductive: No pelvic organ pathology. Other: No free fluid or air. Musculoskeletal: Normal IMPRESSION: Acute appendicitis. The appendix is located in a retrocecal location, extending back up to the caudal tip of the liver. The appendix is perforated in this location and there is some phlegmonous inflammation, possibly with a small developing abscess measuring about 12 mm. This is adjacent and in contact with the liver edge. There is a liver abscess at the caudal tip of the right lobe measuring up to 5 cm in size  with a gas fluid level. The appendicitis is quite likely the etiology of the liver abscess. Electronically Signed   By: Paulina Fusi M.D.   On: 04/26/2023 17:41   US Abdomen Limited RUQ (LIVER/GB)  Result Date: 04/23/2023 CLINICAL DATA:  Severe right upper quadrant pain for 4 days. Cervical cancer. EXAM: ULTRASOUND ABDOMEN LIMITED RIGHT UPPER QUADRANT COMPARISON:  None Available. FINDINGS: Gallbladder: A nonshadowing echogenic focus along the fundus of the gallbladder measures 6 mm. No hyperemia or ring down artifact is present. Gallbladder wall is of normal thickness at 2 mm. No sonographic Eulah Pont sign is reported. Common bile duct: Diameter: 6.0 mm, upper limits of normal. Liver: No focal lesion identified. Within normal limits in parenchymal echogenicity. Portal vein is patent on color Doppler imaging with normal direction of blood flow towards the liver. Other: None. IMPRESSION: 1. Nonshadowing echogenic focus along the fundus of the gallbladder may represent a nonshadowing stone or polyp. No evidence for acute cholecystitis. 2. Common bile duct is upper limits of normal at 6.0 mm. Electronically  Signed   By: Marin Roberts M.D.   On: 04/23/2023 09:45    Labs:  CBC: Recent Labs    04/27/23 0017 04/28/23 0215 04/29/23 0259 05/12/23 1128  WBC 6.6 4.6 3.1* 2.7*  HGB 8.7* 8.0* 8.4* 9.7*  HCT 27.0* 24.7* 26.8* 29.6*  PLT 303 316 380 325    COAGS: No results for input(s): "INR", "APTT" in the last 8760 hours.  BMP: Recent Labs    04/27/23 0017 04/28/23 0215 04/29/23 0259 05/12/23 1128  NA 135 130* 137 141  K 3.1* 3.4* 3.2* 3.9  CL 97* 93* 101 106  CO2 28 27 27 30   GLUCOSE 92 96 95 103*  BUN 13 10 9 17   CALCIUM 8.3* 8.1* 8.3* 9.5  CREATININE 0.82 0.84 0.91 0.71  GFRNONAA >60 >60 >60 >60    LIVER FUNCTION TESTS: Recent Labs    04/27/23 0017 04/28/23 0215 04/29/23 0259 05/12/23 1128  BILITOT 0.8 0.6 0.7 0.3  AST 11* 11* 10* 13*  ALT 11 9 10 5   ALKPHOS 188* 163*  154* 100  PROT 6.8 6.4* 6.4* 7.2  ALBUMIN 2.6* 2.4* 2.6* 3.6     Assessment and Plan:  CT today demonstrates resolution of the drained abscess which is suspected to have been in the perihepatic or subcapsular space along the posterior margin of the lower liver based on CT appearance of the drain today. The appendix is dilated and shows mild residual thickening and inflammation by CT. Drain injection today shows no fistula to the appendix or liver parenchyma. The drain was removed today. She has surgical follow up with Dr. Michaell Cowing. She is unclear when her appointment is and will call the office.   Electronically Signed: Reola Calkins 05/18/2023, 2:13 PM    I spent a total of 15 Minutes in face to face in clinical consultation, greater than 50% of which was counseling/coordinating care post catheter drainage of a perihepatic/periappendiceal abscess.

## 2023-06-02 ENCOUNTER — Telehealth: Payer: Self-pay | Admitting: Nurse Practitioner

## 2023-06-02 ENCOUNTER — Encounter: Payer: Self-pay | Admitting: Gastroenterology

## 2023-06-02 NOTE — Telephone Encounter (Signed)
Inbound call from patient stating she is returning a phone call regarding recent results. Patient is requesting a call back. Please advise, thank you.

## 2023-06-02 NOTE — Telephone Encounter (Signed)
Dr. Adela Lank, refer to prior staff message regarding her follow up CTAP and timing of a colonoscopy and appendectomy surgery. I spoke with patient today and she feels well, no abdominal pain. She has an appointment with Dr .Michaell Cowing on Monday 06/06/2023 and she will contact me with her surgery date so we can coordinate scheduling a colonoscopy one day before her surgery as requested by Dr .Michaell Cowing. She is aware her the date of her colonoscopy 06/21/2023 may change depending on what date her appendectomy is scheduled.

## 2023-06-02 NOTE — Telephone Encounter (Signed)
Okay thanks for letting me know, I appreciate the follow-up.  If they can perform her surgery on 12/11 that would work well, if not we will need to change her colonoscopy date as mentioned.  Thanks

## 2023-06-02 NOTE — Telephone Encounter (Signed)
Patient called back stated she was returning Lorraine Yates's call from yesterday.

## 2023-06-02 NOTE — Telephone Encounter (Signed)
Left message for patient to call back  

## 2023-06-06 ENCOUNTER — Ambulatory Visit: Payer: Self-pay | Admitting: Surgery

## 2023-06-06 DIAGNOSIS — R739 Hyperglycemia, unspecified: Secondary | ICD-10-CM

## 2023-06-21 ENCOUNTER — Encounter: Payer: Self-pay | Admitting: Hematology and Oncology

## 2023-06-21 ENCOUNTER — Encounter: Payer: Self-pay | Admitting: Gastroenterology

## 2023-06-21 ENCOUNTER — Ambulatory Visit (AMBULATORY_SURGERY_CENTER): Payer: BC Managed Care – PPO | Admitting: Gastroenterology

## 2023-06-21 VITALS — BP 149/90 | HR 77 | Temp 97.3°F | Resp 14 | Ht 63.5 in | Wt 147.0 lb

## 2023-06-21 DIAGNOSIS — K5730 Diverticulosis of large intestine without perforation or abscess without bleeding: Secondary | ICD-10-CM

## 2023-06-21 DIAGNOSIS — K648 Other hemorrhoids: Secondary | ICD-10-CM

## 2023-06-21 DIAGNOSIS — R933 Abnormal findings on diagnostic imaging of other parts of digestive tract: Secondary | ICD-10-CM

## 2023-06-21 DIAGNOSIS — K642 Third degree hemorrhoids: Secondary | ICD-10-CM

## 2023-06-21 DIAGNOSIS — K3532 Acute appendicitis with perforation and localized peritonitis, without abscess: Secondary | ICD-10-CM

## 2023-06-21 DIAGNOSIS — Z8719 Personal history of other diseases of the digestive system: Secondary | ICD-10-CM | POA: Diagnosis not present

## 2023-06-21 MED ORDER — SODIUM CHLORIDE 0.9 % IV SOLN: 500.0000 mL | Freq: Once | INTRAVENOUS | Status: DC

## 2023-06-21 NOTE — Progress Notes (Signed)
Sedate, gd SR, tolerated procedure well, VSS, report to RN 

## 2023-06-21 NOTE — Progress Notes (Signed)
[facility] History and Physical   Primary Care Physician:  Patient, No Pcp Per   Reason for Procedure:   History of appendicitis  Plan:    colonoscopy     HPI: Lorraine Yates is a 52 y.o. female  here for colonoscopy to evaluate history of appendicitis with perforation. Had drain placed, treated conservatively. Repeat CT shows interval improvement, resolution of abscess. Had drain removed per surgical team, no fistula noted. She has seen general surgery, they are requesting colonoscopy pre-operatively. Planning to have appedectomy with surgery in near future.    Patient denies any bowel symptoms at this time. No family history of colon cancer known. Otherwise feels well without any cardiopulmonary symptoms. No abdominal pains.  I have discussed risks / benefits of anesthesia and endoscopic procedure with Malachy Moan and they wish to proceed with the exams as outlined today.    Past Medical History:  Diagnosis Date   Acquired pancytopenia (HCC)    Anemia    Blood transfusion without reported diagnosis    History of cancer chemotherapy    cervical cancer  01-20-2023  to 02-28-2023   History of radiation therapy    Cervix- 01/20/23-04/07/23-Dr. 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   oncologist--- dr gorsuch/  radiation oncologist--- dr Roselind Messier;  dx 05/ 2024;   chemo 01-20-2023 to 02-28-2023;   IMRT 01-20-2023 to 03-03-2023  to start high dose radiation 03-10-2023 w/ tandem   Port-A-Cath in place 12/16/2022    Past Surgical History:  Procedure Laterality Date   IR IMAGING GUIDED PORT INSERTION  12/16/2022   IR RADIOLOGIST EVAL & MGMT  05/18/2023   OPERATIVE ULTRASOUND N/A 03/10/2023   Procedure: OPERATIVE ULTRASOUND;  Surgeon: Antony Blackbird, MD;  Location: [facility];  Service: Urology;  Laterality: N/A;   OPERATIVE ULTRASOUND N/A 03/15/2023   Procedure: OPERATIVE ULTRASOUND;  Surgeon: Antony Blackbird, MD;   Location: [facility];  Service: Urology;  Laterality: N/A;   OPERATIVE ULTRASOUND N/A 03/21/2023   Procedure: OPERATIVE ULTRASOUND;  Surgeon: Antony Blackbird, MD;  Location: [facility];  Service: Urology;  Laterality: N/A;   OPERATIVE ULTRASOUND N/A 03/31/2023   Procedure: OPERATIVE ULTRASOUND;  Surgeon: Antony Blackbird, MD;  Location: [facility];  Service: Urology;  Laterality: N/A;   OPERATIVE ULTRASOUND N/A 04/07/2023   Procedure: OPERATIVE ULTRASOUND;  Surgeon: Antony Blackbird, MD;  Location: [facility];  Service: Urology;  Laterality: N/A;   TANDEM RING INSERTION N/A 03/10/2023   Procedure: TANDEM RING INSERTION;  Surgeon: Antony Blackbird, MD;  Location: [facility];  Service: Urology;  Laterality: N/A;   TANDEM RING INSERTION N/A 03/15/2023   Procedure: TANDEM RING INSERTION;  Surgeon: Antony Blackbird, MD;  Location: [facility];  Service: Urology;  Laterality: N/A;   TANDEM RING INSERTION N/A 03/21/2023   Procedure: TANDEM RING INSERTION;  Surgeon: Antony Blackbird, MD;  Location: [facility];  Service: Urology;  Laterality: N/A;   TANDEM RING INSERTION N/A 03/31/2023   Procedure: TANDEM RING INSERTION;  Surgeon: Antony Blackbird, MD;  Location: [facility];  Service: Urology;  Laterality: N/A;   TANDEM RING INSERTION N/A 04/07/2023   Procedure: TANDEM RING INSERTION;  Surgeon: Antony Blackbird, MD;  Location: [facility];  Service: Urology;  Laterality: N/A;    Prior to Admission medications   Medication Sig Start Date End Date Taking? Authorizing Provider  Na Sulfate-K  Sulfate-Mg Sulf 17.5-3.13-1.6 GM/177ML SOLN SMARTSIG:1 Kit(s) By Mouth Once 05/11/23  Yes [provider]  bisacodyl (DULCOLAX) 5 MG EC tablet Take by mouth. 06/06/23   [provider]  metroNIDAZOLE (FLAGYL) 500 MG tablet Take by mouth. 06/06/23   [provider]  neomycin  (MYCIFRADIN) 500 MG tablet Take 1,000 mg by mouth 3 (three) times daily. 06/06/23   [provider]  polyethylene glycol powder (GLYCOLAX/MIRALAX) 17 GM/SCOOP powder Take by mouth. 06/06/23   [provider]    Current Outpatient Medications  Medication Sig Dispense Refill   Na Sulfate-K Sulfate-Mg Sulf 17.5-3.13-1.6 GM/177ML SOLN SMARTSIG:1 Kit(s) By Mouth Once     bisacodyl (DULCOLAX) 5 MG EC tablet Take by mouth.     metroNIDAZOLE (FLAGYL) 500 MG tablet Take by mouth.     neomycin (MYCIFRADIN) 500 MG tablet Take 1,000 mg by mouth 3 (three) times daily.     polyethylene glycol powder (GLYCOLAX/MIRALAX) 17 GM/SCOOP powder Take by mouth.     Current Facility-Administered Medications  Medication Dose Route Frequency Provider Last Rate Last Admin   0.9 %  sodium chloride infusion  500 mL Intravenous Once Verdean Murin, Willaim Rayas, MD        Allergies as of 06/21/2023 - Review Complete 06/21/2023  Allergen Reaction Noted   Demerol [meperidine hcl] Itching, Rash, and Other (See Comments) 03/21/2023   Hydromorphone Rash 03/10/2023    Family History  Problem Relation Age of Onset   Alzheimer's disease Mother    Alzheimer's disease Father    Prostate cancer Father    Hypertension Father    Sickle cell trait Sister    Pancreatic cancer Neg Hx    Colon cancer Neg Hx    Breast cancer Neg Hx    Endometrial cancer Neg Hx    Stomach cancer Neg Hx    Rectal cancer Neg Hx    Esophageal cancer Neg Hx     Social History   Socioeconomic History   Marital status: Married    Spouse name: Not on file   Number of children: 4   Years of education: Not on file   Highest education level: Not on file  Occupational History   Occupation: personal shopper  Tobacco Use   Smoking status: Some Days    Current packs/day: 0.15    Types: Cigarettes   Smokeless tobacco: Never   Tobacco comments:    03-03-2023  Socially - 1 pack per week,   per pt started smoking age 19,  and has cut  down to 1 cig per day trying to quit  Vaping Use   Vaping status: Never Used  Substance and Sexual Activity   Alcohol use: Yes    Comment: socially   Drug use: Yes    Types: Marijuana    Comment: 03-03-2023  per pt average since cancer dx once monthly (previously THC - once q 2 weeks)   Sexual activity: Yes    Birth control/protection: None  Other Topics Concern   Not on file  Social History Narrative   Not on file   Social Determinants of Health   Financial Resource Strain: Not on file  Food Insecurity: No Food Insecurity (04/26/2023)   Hunger Vital Sign    Worried About Running Out of Food in the Last Year: Never true    Ran Out of Food in the Last Year: Never true  Transportation Needs: No Transportation Needs (04/26/2023)   PRAPARE - Administrator, Civil Service (Medical): No  Lack of Transportation (Non-Medical): No  Physical Activity: Not on file  Stress: Not on file  Social Connections: Moderately Isolated (11/25/2022)   Social Connection and Isolation Panel [NHANES]    Frequency of Communication with Friends and Family: More than three times a week    Frequency of Social Gatherings with Friends and Family: More than three times a week    Attends Religious Services: Never    Database administrator or Organizations: No    Attends Banker Meetings: Never    Marital Status: Married  Catering manager Violence: Not At Risk (04/26/2023)   Humiliation, Afraid, Rape, and Kick questionnaire    Fear of Current or Ex-Partner: No    Emotionally Abused: No    Physically Abused: No    Sexually Abused: No    Review of Systems: All other review of systems negative except as mentioned in the HPI.  Physical Exam: Vital signs BP 117/72   Pulse 77   Temp (!) 97.3 F (36.3 C) (Temporal)   Ht 5' 3.5" (1.613 m)   Wt 147 lb (66.7 kg)   SpO2 100%   BMI 25.63 kg/m   General:   Alert,  Well-developed, pleasant and cooperative in NAD Lungs:  Clear  throughout to auscultation.   Heart:  Regular rate and rhythm Abdomen:  Soft, nontender and nondistended.   Neuro/Psych:  Alert and cooperative. Normal mood and affect. A and O x 3  Harlin Rain, MD [facility]

## 2023-06-21 NOTE — Op Note (Signed)
[facility] Patient Name: Lorraine Yates Procedure Date: 06/21/2023 3:04 PM MRN: [id number] Endoscopist: Viviann Spare P. Adela Lank , MD, [id number] Age: 52 Referring MD:  Date of Birth: 03/21/1971 Gender: Female Account #: [id number] Procedure:                Colonoscopy Indications:              Abnormal CT of the GI tract - history of ruptured                            appendix with abscess - pending appendectomy,                            general surgery requesting colonoscopy                            preoperatively. first time exam. Medicines:                Monitored Anesthesia Care Procedure:                Pre-Anesthesia Assessment:                           - Prior to the procedure, a History and Physical                            was performed, and patient medications and                            allergies were reviewed. The patient's tolerance of                            previous anesthesia was also reviewed. The risks                            and benefits of the procedure and the sedation                            options and risks were discussed with the patient.                            All questions were answered, and informed consent                            was obtained. Prior Anticoagulants: The patient has                            taken no anticoagulant or antiplatelet agents. ASA                            Grade Assessment: II - A patient with mild systemic                            disease. After reviewing the risks and benefits,  the patient was deemed in satisfactory condition to                            undergo the procedure.                           After obtaining informed consent, the colonoscope                            was passed under direct vision. Throughout the                            procedure, the patient's blood pressure, pulse, and                            oxygen saturations were monitored  continuously. The                            Olympus Scope [id number] was introduced through the                            anus and advanced to the the terminal ileum, with                            identification of the appendiceal orifice and IC                            valve. The colonoscopy was performed without                            difficulty. The patient tolerated the procedure                            well. The quality of the bowel preparation was                            good. The terminal ileum, ileocecal valve,                            appendiceal orifice, and rectum were photographed. Scope In: 3:23:42 PM Scope Out: 3:41:38 PM Scope Withdrawal Time: 0 hours 14 minutes 40 seconds  Total Procedure Duration: 0 hours 17 minutes 56 seconds  Findings:                 Hemorrhoids were found on perianal exam (grade III                            - reduced on exam).                           The terminal ileum appeared normal.                           Many diverticula were found in the entire colon.  Internal hemorrhoids were found during retroflexion.                           The exam was otherwise without abnormality. The                            appendiceal orifice is normal. Complications:            No immediate complications. Estimated blood loss:                            None. Estimated Blood Loss:     Estimated blood loss: none. Impression:               - Hemorrhoids found on perianal exam.                           - The examined portion of the ileum was normal.                           - Diverticulosis in the entire examined colon.                           - Internal hemorrhoids.                           - The examination was otherwise normal.                           - No abnormality of the appendix noted - no polyp                            or mass lesion in the area.                           Overall, patient has healed  from appendicitis with                            drain placement / antibiotics. Okay to proceed with                            appendectomy, no intraluminal pathology on                            colonoscopy. Recommendation:           - Patient has a contact number available for                            emergencies. The signs and symptoms of potential                            delayed complications were discussed with the                            patient. Return to normal activities tomorrow.  Written discharge instructions were provided to the                            patient.                           - Resume previous diet.                           - Continue present medications.                           - Repeat colonoscopy in 10 years for screening                            purposes.                           - Proceed with appendectomy per general surgery. Viviann Spare P. Tennelle Taflinger, MD 06/21/2023 3:47:09 PM This report has been signed electronically.

## 2023-06-21 NOTE — Patient Instructions (Signed)
-   Resume previous diet. - Continue present medications. - Repeat colonoscopy in 10 years for screening purposes. - Proceed with appendectomy per general surgery.   YOU HAD AN ENDOSCOPIC PROCEDURE TODAY AT THE [facility]:   Refer to the procedure report that was given to you for any specific questions about what was found during the examination.  If the procedure report does not answer your questions, please call your gastroenterologist to clarify.  If you requested that your care partner not be given the details of your procedure findings, then the procedure report has been included in a sealed envelope for you to review at your convenience later.  YOU SHOULD EXPECT: Some feelings of bloating in the abdomen. Passage of more gas than usual.  Walking can help get rid of the air that was put into your GI tract during the procedure and reduce the bloating. If you had a lower endoscopy (such as a colonoscopy or flexible sigmoidoscopy) you may notice spotting of blood in your stool or on the toilet paper. If you underwent a bowel prep for your procedure, you may not have a normal bowel movement for a few days.  Please Note:  You might notice some irritation and congestion in your nose or some drainage.  This is from the oxygen used during your procedure.  There is no need for concern and it should clear up in a day or so.  SYMPTOMS TO REPORT IMMEDIATELY:  Following lower endoscopy (colonoscopy or flexible sigmoidoscopy):  Excessive amounts of blood in the stool  Significant tenderness or worsening of abdominal pains  Swelling of the abdomen that is new, acute  Fever of 100F or higher  For urgent or emergent issues, a gastroenterologist can be reached at any hour by calling [phone]. Do not use MyChart messaging for urgent concerns.    DIET:  We do recommend a small meal at first, but then you may proceed to your regular diet.  Drink plenty of fluids but you should avoid  alcoholic beverages for 24 hours.  ACTIVITY:  You should plan to take it easy for the rest of today and you should NOT DRIVE or use heavy machinery until tomorrow (because of the sedation medicines used during the test).    FOLLOW UP: Our staff will call the number listed on your records the next business day following your procedure.  We will call around 7:15- 8:00 am to check on you and address any questions or concerns that you may have regarding the information given to you following your procedure. If we do not reach you, we will leave a message.     If any biopsies were taken you will be contacted by phone or by letter within the next 1-3 weeks.  Please call us at [phone] if you have not heard about the biopsies in 3 weeks.    SIGNATURES/CONFIDENTIALITY: You and/or your care partner have signed paperwork which will be entered into your electronic medical record.  These signatures attest to the fact that that the information above on your After Visit Summary has been reviewed and is understood.  Full responsibility of the confidentiality of this discharge information lies with you and/or your care-partner.

## 2023-06-22 ENCOUNTER — Telehealth: Payer: Self-pay

## 2023-06-22 NOTE — Telephone Encounter (Signed)
Left message on answering machine. 

## 2023-06-23 ENCOUNTER — Inpatient Hospital Stay (HOSPITAL_BASED_OUTPATIENT_CLINIC_OR_DEPARTMENT_OTHER): Payer: BC Managed Care – PPO | Admitting: Hematology and Oncology

## 2023-06-23 ENCOUNTER — Inpatient Hospital Stay: Payer: BC Managed Care – PPO | Attending: Hematology and Oncology

## 2023-06-23 ENCOUNTER — Telehealth: Payer: Self-pay

## 2023-06-23 ENCOUNTER — Inpatient Hospital Stay: Payer: BC Managed Care – PPO

## 2023-06-23 VITALS — HR 89

## 2023-06-23 VITALS — BP 120/93 | HR 102 | Temp 98.4°F | Resp 18 | Ht 63.5 in | Wt 147.0 lb

## 2023-06-23 DIAGNOSIS — N939 Abnormal uterine and vaginal bleeding, unspecified: Secondary | ICD-10-CM

## 2023-06-23 DIAGNOSIS — Z5112 Encounter for antineoplastic immunotherapy: Secondary | ICD-10-CM | POA: Diagnosis present

## 2023-06-23 DIAGNOSIS — K3532 Acute appendicitis with perforation and localized peritonitis, without abscess: Secondary | ICD-10-CM | POA: Diagnosis not present

## 2023-06-23 DIAGNOSIS — C531 Malignant neoplasm of exocervix: Secondary | ICD-10-CM

## 2023-06-23 DIAGNOSIS — Z79899 Other long term (current) drug therapy: Secondary | ICD-10-CM | POA: Diagnosis not present

## 2023-06-23 DIAGNOSIS — D508 Other iron deficiency anemias: Secondary | ICD-10-CM

## 2023-06-23 DIAGNOSIS — C539 Malignant neoplasm of cervix uteri, unspecified: Secondary | ICD-10-CM | POA: Diagnosis present

## 2023-06-23 DIAGNOSIS — Z720 Tobacco use: Secondary | ICD-10-CM | POA: Diagnosis not present

## 2023-06-23 DIAGNOSIS — Z87891 Personal history of nicotine dependence: Secondary | ICD-10-CM | POA: Diagnosis not present

## 2023-06-23 DIAGNOSIS — D61818 Other pancytopenia: Secondary | ICD-10-CM | POA: Insufficient documentation

## 2023-06-23 DIAGNOSIS — C538 Malignant neoplasm of overlapping sites of cervix uteri: Secondary | ICD-10-CM | POA: Diagnosis not present

## 2023-06-23 LAB — CMP (CANCER CENTER ONLY)
ALT: 12 U/L (ref 0–44)
AST: 16 U/L (ref 15–41)
Albumin: 4.2 g/dL (ref 3.5–5.0)
Alkaline Phosphatase: 96 U/L (ref 38–126)
Anion gap: 5 (ref 5–15)
BUN: 14 mg/dL (ref 6–20)
CO2: 29 mmol/L (ref 22–32)
Calcium: 9.1 mg/dL (ref 8.9–10.3)
Chloride: 107 mmol/L (ref 98–111)
Creatinine: 0.82 mg/dL (ref 0.44–1.00)
GFR, Estimated: >60 mL/min (ref >60)
Glucose, Bld: 100 mg/dL — ABNORMAL HIGH (ref 70–99)
Potassium: 4.2 mmol/L (ref 3.5–5.1)
Sodium: 141 mmol/L (ref 135–145)
Total Bilirubin: 0.2 mg/dL (ref <1.2)
Total Protein: 7.3 g/dL (ref 6.5–8.1)

## 2023-06-23 LAB — CBC WITH DIFFERENTIAL (CANCER CENTER ONLY)
Abs Immature Granulocytes: 0 10*3/uL (ref 0.00–0.07)
Basophils Absolute: 0 10*3/uL (ref 0.0–0.1)
Basophils Relative: 1 %
Eosinophils Absolute: 0.1 10*3/uL (ref 0.0–0.5)
Eosinophils Relative: 3 %
HCT: 34.2 % — ABNORMAL LOW (ref 36.0–46.0)
Hemoglobin: 11.7 g/dL — ABNORMAL LOW (ref 12.0–15.0)
Immature Granulocytes: 0 %
Lymphocytes Relative: 24 %
Lymphs Abs: 0.6 10*3/uL — ABNORMAL LOW (ref 0.7–4.0)
MCH: 33.3 pg (ref 26.0–34.0)
MCHC: 34.2 g/dL (ref 30.0–36.0)
MCV: 97.4 fL (ref 80.0–100.0)
Monocytes Absolute: 0.3 10*3/uL (ref 0.1–1.0)
Monocytes Relative: 13 %
Neutro Abs: 1.6 10*3/uL — ABNORMAL LOW (ref 1.7–7.7)
Neutrophils Relative %: 59 %
Platelet Count: 277 10*3/uL (ref 150–400)
RBC: 3.51 MIL/uL — ABNORMAL LOW (ref 3.87–5.11)
RDW: 13.4 % (ref 11.5–15.5)
WBC Count: 2.6 10*3/uL — ABNORMAL LOW (ref 4.0–10.5)
nRBC: 0 % (ref 0.0–0.2)

## 2023-06-23 LAB — TSH: TSH: 1.177 u[IU]/mL (ref 0.350–4.500)

## 2023-06-23 LAB — PREGNANCY, URINE: Preg Test, Ur: NEGATIVE

## 2023-06-23 MED ORDER — SODIUM CHLORIDE 0.9 % IV SOLN: Freq: Once | INTRAVENOUS | Status: AC

## 2023-06-23 MED ORDER — SODIUM CHLORIDE 0.9% FLUSH
10.0000 mL | Freq: Once | INTRAVENOUS | Status: AC
  Administered 2023-06-23: 10 mL

## 2023-06-23 MED ORDER — HEPARIN SOD (PORK) LOCK FLUSH 100 UNIT/ML IV SOLN
500.0000 [IU] | Freq: Once | INTRAVENOUS | Status: AC | PRN
Start: 2023-06-23 — End: 2023-06-23
  Administered 2023-06-23: 500 [IU]

## 2023-06-23 MED ORDER — SODIUM CHLORIDE 0.9% FLUSH
10.0000 mL | INTRAVENOUS | Status: DC | PRN
  Administered 2023-06-23: 10 mL

## 2023-06-23 MED ORDER — SODIUM CHLORIDE 0.9 % IV SOLN
400.0000 mg | Freq: Once | INTRAVENOUS | Status: AC
  Administered 2023-06-23: 400 mg via INTRAVENOUS
  Filled 2023-06-23: qty 16

## 2023-06-23 NOTE — Assessment & Plan Note (Signed)
She will continue immunotherapy as scheduled Recent CT imaging from November showed positive response to therapy Due to recent infection, PET/CT imaging has not been ordered I will reach out to GYN surgeon to discuss the timing of her PET/CT imaging Unfortunately, the patient is not able to undergo surgery for her perforated appendix due to financial constraint

## 2023-06-23 NOTE — Telephone Encounter (Signed)
-----   Message from [name] sent at 06/21/2023  4:42 PM EST ----- Regarding: hemorrhoid banding Jan can you help book this patient for hemorrhoid banding - routine, in next 3-4 months? Thanks

## 2023-06-23 NOTE — Assessment & Plan Note (Signed)
This is due to recent treatment and infection, dramatically improved Observe closely for now

## 2023-06-23 NOTE — Assessment & Plan Note (Signed)
She is still fundraising to pay for her surgery Currently, she is not symptomatic

## 2023-06-23 NOTE — Patient Instructions (Signed)

## 2023-06-23 NOTE — Telephone Encounter (Signed)
MyChart message to patient with appointment info in March

## 2023-06-23 NOTE — Progress Notes (Signed)
[facility] OFFICE PROGRESS NOTE  Patient Care Team: Patient, No Pcp Per as PCP - General (General Practice) Artis Delay, MD as Consulting Physician (Hematology and Oncology) Carver Fila, MD as Consulting Physician (Gynecologic Oncology) Karie Soda, MD as Consulting Physician (General Surgery) Armbruster, Willaim Rayas, MD as Consulting Physician (Gastroenterology) Antony Blackbird, MD as Consulting Physician (Radiation Oncology)  ASSESSMENT & PLAN:  Cervical cancer [facility]) She will continue immunotherapy as scheduled Recent CT imaging from November showed positive response to therapy Due to recent infection, PET/CT imaging has not been ordered I will reach out to GYN surgeon to discuss the timing of her PET/CT imaging Unfortunately, the patient is not able to undergo surgery for her perforated appendix due to financial constraint  Perforated appendicitis She is still fundraising to pay for her surgery Currently, she is not symptomatic  Tobacco abuse Patient is attempting to quit smoking and has set a quit date for January 1  Pancytopenia, acquired [facility]) This is due to recent treatment and infection, dramatically improved Observe closely for now  No orders of the defined types were placed in this encounter.   All questions were answered. The patient knows to call the clinic with any problems, questions or concerns. The total time spent in the appointment was 30 minutes encounter with patients including review of chart and various tests results, discussions about plan of care and coordination of care plan   Artis Delay, MD 06/23/2023 1:17 PM  INTERVAL HISTORY: Please see below for problem oriented charting. she returns for treatment follow-up Her drain has been removed She has been afebrile and denies abdominal pain However, due to financial constraint, she is not able to get surgery for recent perforated appendix  REVIEW OF SYSTEMS:   Constitutional:  Denies fevers, chills or abnormal weight loss Eyes: Denies blurriness of vision Ears, nose, mouth, throat, and face: Denies mucositis or sore throat Respiratory: Denies cough, dyspnea or wheezes Cardiovascular: Denies palpitation, chest discomfort or lower extremity swelling Gastrointestinal:  Denies nausea, heartburn or change in bowel habits Skin: Denies abnormal skin rashes Lymphatics: Denies new lymphadenopathy or easy bruising Neurological:Denies numbness, tingling or new weaknesses Behavioral/Psych: Mood is stable, no new changes  All other systems were reviewed with the patient and are negative.  I have reviewed the past medical history, past surgical history, social history and family history with the patient and they are unchanged from previous note.  ALLERGIES:  is allergic to demerol [meperidine hcl] and hydromorphone.  MEDICATIONS:  Current Outpatient Medications  Medication Sig Dispense Refill   bisacodyl (DULCOLAX) 5 MG EC tablet Take by mouth.     metroNIDAZOLE (FLAGYL) 500 MG tablet Take by mouth.     Na Sulfate-K Sulfate-Mg Sulf 17.5-3.13-1.6 GM/177ML SOLN SMARTSIG:1 Kit(s) By Mouth Once     neomycin (MYCIFRADIN) 500 MG tablet Take 1,000 mg by mouth 3 (three) times daily.     polyethylene glycol powder (GLYCOLAX/MIRALAX) 17 GM/SCOOP powder Take by mouth.     No current facility-administered medications for this visit.    SUMMARY OF ONCOLOGIC HISTORY: Oncology History Overview Note  PD-L1 CPS 1%   Cervical cancer (HCC)  11/24/2022 Initial Diagnosis   The patient presented recently to the emergency department with both vaginal bleeding and rectal bleeding.  Exam in the emergency department showed a 6 cm irregular cervical mass.     11/24/2022 Imaging   CT pelvis 1. Heterogeneous cervical mass is highly worrisome for cervical carcinoma. Significant border of contact with the adjacent  rectum. Difficult to exclude invasion. 2. Locule of air within the cervical mass  may be due to necrosis or extension from the vagina. Difficult to exclude a fistula to the rectum.   11/24/2022 Imaging   US pelvis 1. Large, approximately 7.6 cm vascular soft tissue mass of the lower uterine segment. This seems inseparable from the cervix and Cervical Carcinoma is not excluded. Alternatively this might be a large lower uterine fibroid. Recommend direct visualization of the cervix.   2. Elsewhere the uterus and endometrium are within normal limits. Normal ovaries and no free fluid.   11/26/2022 Pathology Results   SURGICAL PATHOLOGY  CASE: [id number]  PATIENT: Lorraine Yates  Surgical Pathology Report   FINAL MICROSCOPIC DIAGNOSIS:   A. 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  See comment.   COMMENT:   - Morphologic evaluation and p16 immunohistochemical stain reveal superficial squamous mucosa with findings consistent with high-grade squamous intraepithelial lesion.  The patient's clinical history of a cervical mass is noted, and a deeper more invasive process cannot be excluded.  Clinical and imaging correlation is recommended.      11/29/2022 Initial Diagnosis   Cervical cancer (HCC)   12/02/2022 Cancer Staging   Staging form: Cervix Uteri, AJCC Version 9 - Clinical stage from 12/02/2022: Stage IIIC1 (cT1b3, cN1, cM0) - Signed by Artis Delay, MD on 12/15/2022 Stage prefix: Initial diagnosis   12/15/2022 PET scan   1. Hypermetabolic cervical mass consistent with known cervical carcinoma. 2. There is a mildly FDG avid left pelvic sidewall lymph node which is equivocal for nodal metastasis. 3. No additional sites of disease identified. 4. Aortic Atherosclerosis (ICD10-I70.0).     12/16/2022 Procedure   Successful placement of a right internal jugular approach power injectable Port-A-Cath. The catheter is ready for immediate use.     12/17/2022 Pathology Results   SURGICAL PATHOLOGY  CASE: [id number]  PATIENT: Lorraine Yates   Surgical Pathology Report   Clinical History: malignant neoplasm of cervix, unspecified site   FINAL MICROSCOPIC DIAGNOSIS:   A. 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     12/18/2022 Imaging   1. The normal cervical anatomy is completely effaced by a bulky mass measuring 7.9 x 6.9 x 5.5 cm. Mass extends into the lower uterine segment with loss of parametrial definition about the posterior 180 degrees of the upper portion of the mass. Mass remains confined to the upper third of the vagina. 2. Preserved fat planes to the adjacent bladder and rectum. No ureteral invasion or hydronephrosis. 3. Small left pelvic sidewall lymph node measuring 0.9 x 0.6 cm, previously with equivocal FDG avidity and suspicious for a small nodal metastasis. No other enlarged lymph nodes in the pelvis.     01/20/2023 - 01/20/2023 Chemotherapy   Patient is on Treatment Plan : HEAD/NECK Cisplatin (40) q7d     01/20/2023 -  Chemotherapy   Patient is on Treatment Plan : Cervical Pembrolizumab (200) q21d, cisplatin (40) q7d + XRT / pembrolizumab (400) q42d      01/20/2023 - 04/07/2023 Radiation Therapy   Radiation treatment dates: 01/20/23 through 04/07/23 ( Pelvic IMRT : 01/20/23 through 03/03/23)  ( Brachytherapy :5 treatment sessions delivered from 03/10/23 through 04/07/23)   Site/dose:   1) Pelvic IMRT - 45 Gy delivered in 25 Fx at 1.8 Gy/Fx  2) Pelvic boost - 9 Gy delivered in  5 Fx at 1.8 Gy/Fx 3) Cervical brachytherapy - 27.5 Gy delivered in 5 Fx at 5/5 Gy/Fx    Technique/Mode:  1) IMRT / Photon  2) 3D / Photon  3) HDR Ir-192 / Brachytherapy    04/23/2023 Imaging   1. Nonshadowing echogenic focus along the fundus of the gallbladder may represent a nonshadowing stone or polyp. No evidence for acute cholecystitis. 2. Common bile duct is upper limits of normal at  6.0 mm.     PHYSICAL EXAMINATION: ECOG PERFORMANCE STATUS: 1 - Symptomatic but completely ambulatory  Vitals:   06/23/23 1257  BP: (!) 120/93  Pulse: (!) 102  Resp: 18  Temp: 98.4 F (36.9 C)  SpO2: 99%   Filed Weights   06/23/23 1257  Weight: 147 lb (66.7 kg)    GENERAL:alert, no distress and comfortable   LABORATORY DATA:  I have reviewed the data as listed    Component Value Date/Time   NA 141 05/12/2023 1128   K 3.9 05/12/2023 1128   CL 106 05/12/2023 1128   CO2 30 05/12/2023 1128   GLUCOSE 103 (H) 05/12/2023 1128   BUN 17 05/12/2023 1128   CREATININE 0.71 05/12/2023 1128   CALCIUM 9.5 05/12/2023 1128   PROT 7.2 05/12/2023 1128   ALBUMIN 3.6 05/12/2023 1128   AST 13 (L) 05/12/2023 1128   ALT 5 05/12/2023 1128   ALKPHOS 100 05/12/2023 1128   BILITOT 0.3 05/12/2023 1128   GFRNONAA >60 05/12/2023 1128    No results found for: "SPEP", "UPEP"  Lab Results  Component Value Date   WBC 2.6 (L) 06/23/2023   NEUTROABS 1.6 (L) 06/23/2023   HGB 11.7 (L) 06/23/2023   HCT 34.2 (L) 06/23/2023   MCV 97.4 06/23/2023   PLT 277 06/23/2023      Chemistry      Component Value Date/Time   NA 141 05/12/2023 1128   K 3.9 05/12/2023 1128   CL 106 05/12/2023 1128   CO2 30 05/12/2023 1128   BUN 17 05/12/2023 1128   CREATININE 0.71 05/12/2023 1128      Component Value Date/Time   CALCIUM 9.5 05/12/2023 1128   ALKPHOS 100 05/12/2023 1128   AST 13 (L) 05/12/2023 1128   ALT 5 05/12/2023 1128   BILITOT 0.3 05/12/2023 1128       RADIOGRAPHIC STUDIES: I have reviewed his recent CT imaging with the patient I have personally reviewed the radiological images as listed and agreed with the findings in the report.

## 2023-06-23 NOTE — Assessment & Plan Note (Signed)
Patient is attempting to quit smoking and has set a quit date for January 1

## 2023-06-24 LAB — T4: T4, Total: 7.4 ug/dL (ref 4.5–12.0)

## 2023-06-27 ENCOUNTER — Telehealth: Payer: Self-pay | Admitting: Oncology

## 2023-06-27 DIAGNOSIS — C531 Malignant neoplasm of exocervix: Secondary | ICD-10-CM

## 2023-06-27 DIAGNOSIS — K3532 Acute appendicitis with perforation and localized peritonitis, without abscess: Secondary | ICD-10-CM

## 2023-06-27 NOTE — Telephone Encounter (Signed)
Called [place] regarding surgery.  She would be interested in seeing a Careers adviser at [facility].  Referral placed to general surgery at [facility] per Dr. Bertis Ruddy.

## 2023-07-06 ENCOUNTER — Encounter: Payer: Self-pay | Admitting: Hematology and Oncology

## 2023-07-11 ENCOUNTER — Ambulatory Visit (INDEPENDENT_AMBULATORY_CARE_PROVIDER_SITE_OTHER): Payer: BC Managed Care – PPO | Admitting: Surgery

## 2023-07-11 ENCOUNTER — Encounter: Payer: Self-pay | Admitting: Surgery

## 2023-07-11 VITALS — BP 146/94 | HR 76 | Ht 63.0 in | Wt 148.6 lb

## 2023-07-11 DIAGNOSIS — K3533 Acute appendicitis with perforation and localized peritonitis, with abscess: Secondary | ICD-10-CM

## 2023-07-11 DIAGNOSIS — K35211 Acute appendicitis with generalized peritonitis, with perforation and abscess: Secondary | ICD-10-CM

## 2023-07-11 NOTE — Patient Instructions (Addendum)
Laparoscopic Appendectomy, Adult  A laparoscopic appendectomy is a surgery to take out the appendix. The appendix is a finger-like structure that is attached to the large intestine. This procedure may be done to prevent an inflamed appendix from bursting (rupturing). It may also be done to treat the infection from an appendix that has ruptured. It is often done right after appendicitis is diagnosed. Appendicitis is inflammation of the appendix. In this surgery, your health care provider uses a thin, lighted tube with a camera (laparoscope) to take out the appendix through three small incisions. This is a minimally invasive surgery. It usually results in less pain, fewer problems, and a quicker recovery than surgery done through a large incision (open appendectomy). Tell a health care provider about: Any allergies you have. All medicines you are taking, including vitamins, herbs, eye drops, creams, and over-the-counter medicines. Any steroid use. This includes creams or steroids you take by mouth. Any problems you or family members have had with anesthetic medicines. Any bleeding problems you have. Any surgeries you have had. Any medical conditions you have. Whether you are pregnant or may be pregnant. What are the risks? Generally, this is a safe procedure. However, problems may occur, including: Infection. Bleeding. Damage to nearby structures or organs. Allergic reactions to medicines. A collection of pus (abscess). Blood clots in the legs. What happens before the procedure? When to stop eating and drinking Follow instructions from your health care provider about eating and drinking restrictions. You may be asked not to eat or drink as soon as the diagnosis of appendicitis is made. Medicines Ask your health care provider about: Changing or stopping your regular medicines. This is especially important if you are taking diabetes medicines or blood thinners. Taking medicines such as aspirin  and ibuprofen. These medicines can thin your blood. Do not take these medicines unless your health care provider tells you to take them. Taking over-the-counter medicines, vitamins, herbs, and supplements. General instructions If you will be going home right after the procedure, plan to have a responsible adult: Take you home from the hospital or clinic. You will not be allowed to drive. Care for you for the time you are told. Ask your health care provider: How your surgery site will be marked. What steps will be taken to help prevent infection. These steps may include: Removing hair at the surgery site. Washing skin with a germ-killing soap. Taking antibiotic medicine. What happens during the procedure?  An IV will be inserted into one of your veins. You will be given one or more of the following: A medicine to help you relax (sedative). A medicine to numb the area (local anesthetic). A medicine to make you fall asleep (general anesthetic). A thin, flexible tube (catheter) may be put into your bladder to drain urine. A tube may be passed through your nose or mouth and into your stomach (orogastric or nasogastric tube) to drain any stomach contents. Your surgeon will make three small incisions near your belly button (navel). A gas (carbon dioxide) will be used to fill your abdomen. The gas will make your abdomen expand. This helps the surgeon see clearly and gives him or her more room to work. A laparoscope will be passed through one of the incisions. Other surgical instruments will be passed through the other incisions to assist in surgery. The appendix will be located and removed through one of the incisions. The abdomen may be washed out to remove bacteria. The incisions will be closed with stitches (sutures),  staples, or adhesive strips. A bandage (dressing) may be used to cover the incisions. If a tube was inserted into your bladder or stomach, it will be removed. The procedure may  vary among health care providers and hospitals. What happens after the procedure? Your blood pressure, heart rate, breathing rate, and blood oxygen level will be monitored until you leave the hospital or clinic. You will be given medicines as needed to control pain and infection. If you were given a sedative during the procedure, it can affect you for several hours. Do not drive or operate machinery until your health care provider says that it is safe. If your appendix did not rupture, you may be able to go home the same day after your surgery. If your appendix ruptured: You will get antibiotic medicine through an IV line. You may be sent home with a temporary drain. Summary A laparoscopic appendectomy is a surgery to take out the appendix. The appendix is removed through three small incisions with the help of a thin, lighted tube that has a camera (laparoscope). This is a safe procedure, but there are some risks. Risks include bleeding, infection, allergic reaction to medicines, or damage to nearby organs. After the procedure, your blood pressure, heart rate, breathing rate, and blood oxygen level will be monitored until you leave the hospital or clinic. You will be given medicines as needed to control pain and infection. This information is not intended to replace advice given to you by your health care provider. Make sure you discuss any questions you have with your health care provider. Document Revised: 04/09/2021 Document Reviewed: 04/09/2021 Elsevier Patient Education  2024 [employer].

## 2023-07-11 NOTE — Progress Notes (Signed)
07/11/2023  Reason for Visit: Acute appendicitis with abscess  Requesting Provider:  Artis Delay, MD  History of Present Illness: Lorraine Yates is a 52 y.o. female presenting for evaluation of acute appendicitis with abscess.  The patient was initially admitted to [facility] long hospital on 04/26/2023 with right upper quadrant pain and CT scan showing likely perforated appendicitis with abscess.  She had percutaneous drain placed by interventional radiology and on follow-up imaging on 05/18/2023, it was noted that her abscess had resolved with no fistula to the colon and her drain was removed.  She saw Dr. Michaell Cowing with [facility] surgery on 06/06/2023 to schedule surgery but she has not done this yet due to financial issues.  She had a colonoscopy on 06/21/2023 which did not show any masses at the appendiceal orifice or in other locations of her colon.  Due to financial issues, she was referred to [place] for possible management for her appendicitis.  Since her drain was removed, the patient denies any abdominal pain, nausea, vomiting.  She has otherwise been doing well.  She does report having 1 episode of some discomfort when she was lying on her left side but denies otherwise any issues.  Past Medical History: Past Medical History:  Diagnosis Date   Acquired pancytopenia (HCC)    Anemia    Blood transfusion without reported diagnosis    History of cancer chemotherapy    cervical cancer  01-20-2023  to 02-28-2023   History of radiation therapy    Cervix- 01/20/23-04/07/23-Dr. 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   oncologist--- dr gorsuch/  radiation oncologist--- dr Roselind Messier;  dx 05/ 2024;   chemo 01-20-2023 to 02-28-2023;   IMRT 01-20-2023 to 03-03-2023  to start high dose radiation 03-10-2023 w/ tandem   Port-A-Cath in place 12/16/2022     Past Surgical History: Past Surgical History:  Procedure Laterality Date   IR IMAGING GUIDED PORT INSERTION   12/16/2022   IR RADIOLOGIST EVAL & MGMT  05/18/2023   OPERATIVE ULTRASOUND N/A 03/10/2023   Procedure: OPERATIVE ULTRASOUND;  Surgeon: Antony Blackbird, MD;  Location: [facility];  Service: Urology;  Laterality: N/A;   OPERATIVE ULTRASOUND N/A 03/15/2023   Procedure: OPERATIVE ULTRASOUND;  Surgeon: Antony Blackbird, MD;  Location: [facility];  Service: Urology;  Laterality: N/A;   OPERATIVE ULTRASOUND N/A 03/21/2023   Procedure: OPERATIVE ULTRASOUND;  Surgeon: Antony Blackbird, MD;  Location: [facility];  Service: Urology;  Laterality: N/A;   OPERATIVE ULTRASOUND N/A 03/31/2023   Procedure: OPERATIVE ULTRASOUND;  Surgeon: Antony Blackbird, MD;  Location: [facility];  Service: Urology;  Laterality: N/A;   OPERATIVE ULTRASOUND N/A 04/07/2023   Procedure: OPERATIVE ULTRASOUND;  Surgeon: Antony Blackbird, MD;  Location: [facility];  Service: Urology;  Laterality: N/A;   TANDEM RING INSERTION N/A 03/10/2023   Procedure: TANDEM RING INSERTION;  Surgeon: Antony Blackbird, MD;  Location: [facility];  Service: Urology;  Laterality: N/A;   TANDEM RING INSERTION N/A 03/15/2023   Procedure: TANDEM RING INSERTION;  Surgeon: Antony Blackbird, MD;  Location: [facility];  Service: Urology;  Laterality: N/A;   TANDEM RING INSERTION N/A 03/21/2023   Procedure: TANDEM RING INSERTION;  Surgeon: Antony Blackbird, MD;  Location: [facility];  Service: Urology;  Laterality: N/A;   TANDEM RING INSERTION N/A 03/31/2023   Procedure: TANDEM RING INSERTION;  Surgeon: Antony Blackbird, MD;  Location: [facility];  Service: Urology;  Laterality: N/A;   TANDEM RING INSERTION N/A 04/07/2023   Procedure: TANDEM RING INSERTION;  Surgeon: Antony Blackbird, MD;  Location: [facility];  Service: Urology;  Laterality: N/A;    Home Medications: Prior to Admission medications   Medication Sig Start Date  End Date Taking? Authorizing Provider  bisacodyl (DULCOLAX) 5 MG EC tablet Take by mouth. 06/06/23   [provider]  metroNIDAZOLE (FLAGYL) 500 MG tablet Take by mouth. 06/06/23   [provider]  Na Sulfate-K Sulfate-Mg Sulf 17.5-3.13-1.6 GM/177ML SOLN SMARTSIG:1 Kit(s) By Mouth Once 05/11/23   [provider]  neomycin (MYCIFRADIN) 500 MG tablet Take 1,000 mg by mouth 3 (three) times daily. 06/06/23   [provider]  polyethylene glycol powder (GLYCOLAX/MIRALAX) 17 GM/SCOOP powder Take by mouth. 06/06/23   [provider]    Allergies: Allergies  Allergen Reactions   Demerol [Meperidine Hcl] Itching, Rash and Other (See Comments)    Redness and itching to IV insertion site    Hydromorphone Rash    Redness and itching to IV insertion site that extended up left arm 03/10/23    Social History:  reports that she has been smoking cigarettes. She has never used smokeless tobacco. She reports current alcohol use. She reports current drug use. Drug: Marijuana.   Family History: Family History  Problem Relation Age of Onset   Alzheimer's disease Mother    Alzheimer's disease Father    Prostate cancer Father    Hypertension Father    Sickle cell trait Sister    Pancreatic cancer Neg Hx    Colon cancer Neg Hx    Breast cancer Neg Hx    Endometrial cancer Neg Hx    Stomach cancer Neg Hx    Rectal cancer Neg Hx    Esophageal cancer Neg Hx     Review of Systems: Review of Systems  Constitutional:  Negative for chills and fever.  HENT:  Negative for hearing loss.   Respiratory:  Negative for shortness of breath.   Cardiovascular:  Negative for chest pain.  Gastrointestinal:  Negative for abdominal pain, nausea and vomiting.  Genitourinary:  Negative for dysuria.  Musculoskeletal:  Negative for myalgias.  Skin:  Negative for rash.  Neurological:  Negative for dizziness.  Psychiatric/Behavioral:  Negative for depression.     Physical  Exam BP (!) 146/94   Pulse 76   Ht 5\' 3"  (1.6 m)   Wt 148 lb 9.6 oz (67.4 kg)   SpO2 100%   BMI 26.32 kg/m  CONSTITUTIONAL: No acute distress, well-nourished HEENT:  Normocephalic, atraumatic, extraocular motion intact. NECK: Trachea is midline, and there is no jugular venous distension.  RESPIRATORY:  Lungs are clear, and breath sounds are equal bilaterally. Normal respiratory effort without pathologic use of accessory muscles. CARDIOVASCULAR: Heart is regular without murmurs, gallops, or rubs. GI: The abdomen is soft, nondistended, nontender to palpation.  Prior percutaneous drain site is fully healed.  MUSCULOSKELETAL:  Normal muscle strength and tone in all four extremities.  No peripheral edema or cyanosis. SKIN: Skin turgor is normal. There are no pathologic skin lesions.  NEUROLOGIC:  Motor and sensation is grossly normal.  Cranial nerves are grossly intact. PSYCH:  Alert and oriented to person, place and time. Affect is normal.  Laboratory Analysis: Labs on 06/23/2023: Sodium 141, potassium 4.2, chloride 107, CO2 29, BUN 14, creatinine 0.82.  LFTs within normal limits.  WBC 2.6, hemoglobin 11.7, hematocrit 34.2, platelets 277.  Imaging: CT abdomen/pelvis on 04/26/2023: IMPRESSION: Acute appendicitis. The appendix is located in a retrocecal location, extending back up to the caudal tip of the liver. The appendix is perforated in this location and there is some phlegmonous inflammation, possibly with a small developing abscess measuring about 12 mm. This is adjacent and in contact with the liver edge. There is a liver abscess at the caudal tip of the right lobe measuring up to 5 cm in size with a gas fluid level. The appendicitis is quite likely the etiology of the liver abscess.  CT abdomen/pelvis on 05/18/2023: IMPRESSION: 1. Percutaneous drain along the posterior subcapsular aspect of the inferior right lobe of the liver. Based on location and current drain appearance, the  previously drained abscess was likely in the perihepatic or subcapsular space and not in the actual liver parenchyma itself. The abscess is completely resolved. No new fluid collections are identified. 2. Mildly thickened and inflamed appendix again noted in a retrocecal location ascending up towards the liver. Maximum appendiceal diameter is approximately 11 mm. There is some air in the distal appendix.  Assessment and Plan: This is a 52 y.o. female with a history of acute appendicitis with abscess status post percutaneous drainage.  - Discussed with patient findings on her imaging studies as well as plan for drainage being appropriate given the abscess noted.  Discussed with her that we will also agree for interval appendectomy and she is already had a colonoscopy showing no other issues that would prevent her from having surgery.  Her drain has since been removed and most recent CT scan did not show any residual abscess.  She has been doing well without any fevers or abdominal pain. - Discussed with patient that from the financial standpoint, we will have to schedule her surgery in order to see based on her insurance what the upfront cost would be.  This may be something as simple as being out of network or in network and so for now we will tentatively schedule her for surgery.  I have also given the patient: Health financial assistance application so she can fill this out and submit as a precaution in case any financial assistance is needed based on insurance issues.  Once we have the surgery scheduled, then we will be able to give her more information about costs.  If anything is too prohibitive at the time, we can still delay surgery while waiting for [place] financial assistance or until her finances are better for surgery.  Discussed with the patient that the eventual goal would be to remove her appendix but currently there is no specific emergency to remove it. - Discussed with the patient the  plan for robotic assisted appendectomy and reviewed the surgery at length with her including the planned incisions, risks of bleeding, infection, injury to surrounding structures, this would be an outpatient procedure, postoperative activity restrictions, pain control, and she is willing to proceed. - Will also discussed with Dr. Bertis Ruddy about specific timing for her surgery given that she is currently on chemotherapy.  Once we know of this, we will contact the patient to schedule her. - Patient is agreement with this plan and all of her questions have been answered.  I spent 40 minutes dedicated to the care of this patient on the date of this encounter to include pre-visit review of records, face-to-face time with the patient discussing diagnosis and management, and any post-visit coordination of care.   Howie Ill, MD [facility]

## 2023-07-13 ENCOUNTER — Encounter: Payer: Self-pay | Admitting: Hematology and Oncology

## 2023-07-15 ENCOUNTER — Encounter: Payer: Self-pay | Admitting: Gynecologic Oncology

## 2023-07-15 ENCOUNTER — Inpatient Hospital Stay: Payer: BC Managed Care – PPO | Attending: Hematology and Oncology | Admitting: Gynecologic Oncology

## 2023-07-15 VITALS — BP 138/76 | HR 77 | Temp 98.5°F | Resp 20 | Wt 151.4 lb

## 2023-07-15 DIAGNOSIS — Z8541 Personal history of malignant neoplasm of cervix uteri: Secondary | ICD-10-CM | POA: Insufficient documentation

## 2023-07-15 DIAGNOSIS — N888 Other specified noninflammatory disorders of cervix uteri: Secondary | ICD-10-CM

## 2023-07-15 DIAGNOSIS — C539 Malignant neoplasm of cervix uteri, unspecified: Secondary | ICD-10-CM

## 2023-07-15 DIAGNOSIS — Z923 Personal history of irradiation: Secondary | ICD-10-CM | POA: Diagnosis not present

## 2023-07-15 DIAGNOSIS — Z9221 Personal history of antineoplastic chemotherapy: Secondary | ICD-10-CM | POA: Diagnosis not present

## 2023-07-15 NOTE — Progress Notes (Signed)
 Gynecologic Oncology Return Clinic Visit  07/15/23  Reason for Visit: surveillance  Treatment History: Oncology History Overview Note  PD-L1 CPS 1%   Cervical cancer (HCC)  11/24/2022 Initial Diagnosis   The patient presented recently to the emergency department with both vaginal bleeding and rectal bleeding.  Exam in the emergency department showed a 6 cm irregular cervical mass.     11/24/2022 Imaging   CT pelvis 1. Heterogeneous cervical mass is highly worrisome for cervical carcinoma. Significant border of contact with the adjacent rectum. Difficult to exclude invasion. 2. Locule of air within the cervical mass may be due to necrosis or extension from the vagina. Difficult to exclude a fistula to the rectum.   11/24/2022 Imaging   US  pelvis 1. Large, approximately 7.6 cm vascular soft tissue mass of the lower uterine segment. This seems inseparable from the cervix and Cervical Carcinoma is not excluded. Alternatively this might be a large lower uterine fibroid. Recommend direct visualization of the cervix.   2. Elsewhere the uterus and endometrium are within normal limits. Normal ovaries and no free fluid.   11/26/2022 Pathology Results   SURGICAL PATHOLOGY  CASE: [id number]  PATIENT: Wileen Dalpe  Surgical Pathology Report   FINAL MICROSCOPIC DIAGNOSIS:   A. 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  See comment.   COMMENT:   - Morphologic evaluation and p16 immunohistochemical stain reveal superficial squamous mucosa with findings consistent with high-grade squamous intraepithelial lesion.  The patient's clinical history of a cervical mass is noted, and a deeper more invasive process cannot be excluded.  Clinical and imaging correlation is recommended.      11/29/2022 Initial Diagnosis   Cervical cancer (HCC)   12/02/2022 Cancer Staging   Staging form: Cervix Uteri, AJCC Version 9 - Clinical stage from 12/02/2022: Stage IIIC1  (cT1b3, cN1, cM0) - Signed by Lonn Hicks, MD on 12/15/2022 Stage prefix: Initial diagnosis   12/15/2022 PET scan   1. Hypermetabolic cervical mass consistent with known cervical carcinoma. 2. There is a mildly FDG avid left pelvic sidewall lymph node which is equivocal for nodal metastasis. 3. No additional sites of disease identified. 4. Aortic Atherosclerosis (ICD10-I70.0).     12/16/2022 Procedure   Successful placement of a right internal jugular approach power injectable Port-A-Cath. The catheter is ready for immediate use.     12/17/2022 Pathology Results   SURGICAL PATHOLOGY  CASE: [id number]  PATIENT: Dynasty Modica  Surgical Pathology Report   Clinical History: malignant neoplasm of cervix, unspecified site   FINAL MICROSCOPIC DIAGNOSIS:   A. 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     12/18/2022 Imaging   1. The normal cervical anatomy is completely effaced by a bulky mass measuring 7.9 x 6.9 x 5.5 cm. Mass extends into the lower uterine segment with loss of parametrial definition about the posterior 180 degrees of the upper portion of the mass. Mass remains confined to the upper third of the vagina. 2. Preserved fat planes to the adjacent bladder and rectum. No ureteral invasion or hydronephrosis. 3. Small left pelvic sidewall lymph node measuring 0.9 x 0.6 cm, previously with equivocal FDG avidity and suspicious for a small nodal metastasis. No other enlarged lymph nodes in the pelvis.     01/20/2023 - 01/20/2023 Chemotherapy   Patient is on Treatment Plan : HEAD/NECK Cisplatin  (40) q7d  01/20/2023 -  Chemotherapy   Patient is on Treatment Plan : Cervical Pembrolizumab  (200) q21d, cisplatin  (40) q7d + XRT / pembrolizumab  (400) q42d      01/20/2023 - 04/07/2023 Radiation Therapy   Radiation treatment dates: 01/20/23 through  04/07/23 ( Pelvic IMRT : 01/20/23 through 03/03/23)  ( Brachytherapy :5 treatment sessions delivered from 03/10/23 through 04/07/23)   Site/dose:   1) Pelvic IMRT - 45 Gy delivered in 25 Fx at 1.8 Gy/Fx  2) Pelvic boost - 9 Gy delivered in 5 Fx at 1.8 Gy/Fx 3) Cervical brachytherapy - 27.5 Gy delivered in 5 Fx at 5/5 Gy/Fx    Technique/Mode:  1) IMRT / Photon  2) 3D / Photon  3) HDR Ir-192 / Brachytherapy    04/23/2023 Imaging   1. Nonshadowing echogenic focus along the fundus of the gallbladder may represent a nonshadowing stone or polyp. No evidence for acute cholecystitis. 2. Common bile duct is upper limits of normal at 6.0 mm.     Interval History: Overall doing well, tolerating maintenance therapy.  Is waiting to hear back about surgery to remove appendix after she presented with a perforated appendicitis, perihepatic abscess that had to be drained.  She is feeling much better.  Denies any abdominal or pelvic pain.  Continues to struggle with constipation at baseline, uses MiraLAX  as needed.  Denies any vaginal bleeding or discharge.  Has not been using vaginal dilator but is sexually active with her husband.  Past Medical/Surgical History: Past Medical History:  Diagnosis Date   Acquired pancytopenia (HCC)    Anemia    Blood transfusion without reported diagnosis    History of cancer chemotherapy    cervical cancer  01-20-2023  to 02-28-2023   History of radiation therapy    Cervix- 01/20/23-04/07/23-Dr. 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   oncologist--- dr gorsuch/  radiation oncologist--- dr nasuti;  dx 05/ 2024;   chemo 01-20-2023 to 02-28-2023;   IMRT 01-20-2023 to 03-03-2023  to start high dose radiation 03-10-2023 w/ tandem   Port-A-Cath in place 12/16/2022    Past Surgical History:  Procedure Laterality Date   IR IMAGING GUIDED PORT INSERTION  12/16/2022   IR RADIOLOGIST EVAL & MGMT  05/18/2023   OPERATIVE ULTRASOUND  N/A 03/10/2023   Procedure: OPERATIVE ULTRASOUND;  Surgeon: Nasuti Lynwood, MD;  Location: [facility];  Service: Urology;  Laterality: N/A;   OPERATIVE ULTRASOUND N/A 03/15/2023   Procedure: OPERATIVE ULTRASOUND;  Surgeon: Nasuti Lynwood, MD;  Location: [facility];  Service: Urology;  Laterality: N/A;   OPERATIVE ULTRASOUND N/A 03/21/2023   Procedure: OPERATIVE ULTRASOUND;  Surgeon: Nasuti Lynwood, MD;  Location: [facility];  Service: Urology;  Laterality: N/A;   OPERATIVE ULTRASOUND N/A 03/31/2023   Procedure: OPERATIVE ULTRASOUND;  Surgeon: Nasuti Lynwood, MD;  Location: [facility];  Service: Urology;  Laterality: N/A;   OPERATIVE ULTRASOUND N/A 04/07/2023   Procedure: OPERATIVE ULTRASOUND;  Surgeon: Nasuti Lynwood, MD;  Location: [facility];  Service: Urology;  Laterality: N/A;   TANDEM RING INSERTION N/A 03/10/2023   Procedure: TANDEM RING INSERTION;  Surgeon: Nasuti Lynwood, MD;  Location: [facility];  Service: Urology;  Laterality: N/A;   TANDEM RING INSERTION N/A 03/15/2023   Procedure: TANDEM RING INSERTION;  Surgeon: Nasuti Lynwood, MD;  Location: [facility];  Service: Urology;  Laterality: N/A;   TANDEM RING INSERTION N/A 03/21/2023   Procedure:  TANDEM RING INSERTION;  Surgeon: Shannon Agent, MD;  Location: [facility];  Service: Urology;  Laterality: N/A;   TANDEM RING INSERTION N/A 03/31/2023   Procedure: TANDEM RING INSERTION;  Surgeon: Shannon Agent, MD;  Location: [facility];  Service: Urology;  Laterality: N/A;   TANDEM RING INSERTION N/A 04/07/2023   Procedure: TANDEM RING INSERTION;  Surgeon: Shannon Agent, MD;  Location: [facility];  Service: Urology;  Laterality: N/A;    Family History  Problem Relation Age of Onset   Alzheimer's disease Mother    Alzheimer's disease Father    Prostate cancer Father    Hypertension  Father    Sickle cell trait Sister    Pancreatic cancer Neg Hx    Colon cancer Neg Hx    Breast cancer Neg Hx    Endometrial cancer Neg Hx    Stomach cancer Neg Hx    Rectal cancer Neg Hx    Esophageal cancer Neg Hx     Social History   Socioeconomic History   Marital status: Married    Spouse name: Not on file   Number of children: 4   Years of education: Not on file   Highest education level: Not on file  Occupational History   Occupation: personal shopper  Tobacco Use   Smoking status: Some Days    Current packs/day: 0.15    Types: Cigarettes   Smokeless tobacco: Never   Tobacco comments:    03-03-2023  Socially - 1 pack per week,   per pt started smoking age 65,  and has cut down to 1 cig per day trying to quit  Vaping Use   Vaping status: Never Used  Substance and Sexual Activity   Alcohol use: Yes    Comment: socially   Drug use: Yes    Types: Marijuana    Comment: 03-03-2023  per pt average since cancer dx once monthly (previously THC - once q 2 weeks)   Sexual activity: Yes    Birth control/protection: None  Other Topics Concern   Not on file  Social History Narrative   Not on file   Social Drivers of Health   Financial Resource Strain: Not on file  Food Insecurity: No Food Insecurity (04/26/2023)   Hunger Vital Sign    Worried About Running Out of Food in the Last Year: Never true    Ran Out of Food in the Last Year: Never true  Transportation Needs: No Transportation Needs (04/26/2023)   PRAPARE - Administrator, Civil Service (Medical): No    Lack of Transportation (Non-Medical): No  Physical Activity: Not on file  Stress: Not on file  Social Connections: Moderately Isolated (11/25/2022)   Social Connection and Isolation Panel [NHANES]    Frequency of Communication with Friends and Family: More than three times a week    Frequency of Social Gatherings with Friends and Family: More than three times a week    Attends Religious Services:  Never    Database Administrator or Organizations: No    Attends Banker Meetings: Never    Marital Status: Married    Current Medications: No current outpatient medications on file.  Review of Systems: + constipation, ringing in ears Denies appetite changes, fevers, chills, fatigue, unexplained weight changes. Denies hearing loss, neck lumps or masses, mouth sores, or voice changes. Denies cough or wheezing.  Denies shortness of breath. Denies chest pain or palpitations. Denies leg swelling. Denies  abdominal distention, pain, blood in stools, diarrhea, nausea, vomiting, or early satiety. Denies pain with intercourse, dysuria, frequency, hematuria or incontinence. Denies hot flashes, pelvic pain, vaginal bleeding or vaginal discharge.   Denies joint pain, back pain or muscle pain/cramps. Denies itching, rash, or wounds. Denies dizziness, headaches, numbness or seizures. Denies swollen lymph nodes or glands, denies easy bruising or bleeding. Denies anxiety, depression, confusion, or decreased concentration.  Physical Exam: BP 138/76 (BP Location: Left Arm, Patient Position: Sitting)   Pulse 77   Temp 98.5 F (36.9 C) (Oral)   Resp 20   Wt 151 lb 6.4 oz (68.7 kg)   SpO2 100%   BMI 26.82 kg/m  General: Alert, oriented, no acute distress. HEENT: Normocephalic, atraumatic, sclera anicteric. Chest: Clear to auscultation bilaterally.  No wheezes or rhonchi. Cardiovascular: Regular rate and rhythm, no murmurs. Abdomen: soft, nontender.  Normoactive bowel sounds.  No masses or hepatosplenomegaly appreciated.   Extremities: Grossly normal range of motion.  Warm, well perfused.  No edema bilaterally. Skin: No rashes or lesions noted. Lymphatics: No cervical, supraclavicular, or inguinal adenopathy. GU: Normal appearing external genitalia without erythema, excoriation, or lesions.  Speculum exam reveals no discharge or blood, vaginal mucosa is normal in appearance.  Mild  radiation changes noted at the apex with cervix somewhat posterior facing.  No lesions or masses, no atypical vascularity.  Bimanual exam reveals cervix is soft, no firmness or nodularity appreciated.  Rectovaginal exam confirms findings, no parametrial invasion.  Laboratory & Radiologic Studies: None new  Assessment & Plan: KAYLINE SHEER is a 53 y.o. woman with Stage IIIC1 cervical cancer. She received definitive radiation with sensitizing Cisplatin  and pembrolizumab  (per Keynote A18 regimen), now on maintenance IO.  Diagnosed with acute perforated appendicitis shortly after finishing HDR brachytherapy (04/07/23). This was managed with IV antibiotics and drain placement.  Drain removed 05/18/23. She underwent follow-up colonoscopy on 06/21/23 showing many diverticula in the entire colon, normal terminal ileum, internal and external hemorrhoids.  Patient is overall doing well.  She has recovered from sequelae of perforated appendicitis.  She is waiting to hear back about possible surgery with Dr. Sheldon.  From a cervical cancer standpoint, she is doing well on maintenance treatment.  Exam is overall reassuring without evidence of disease.  Discussed that typically we would repeat posttreatment PET scan approximately 3 after finishing radiation.  There was some desire to delay scan until after the patient underwent surgery to remove her appendix.  I discussed this again briefly with Dr. Lonn today.  Plan to revisit this at her next follow-up for maintenance therapy.  Discussed importance of continued intercourse versus vaginal dilator use.  Due for cotesting in 03/2024.  22 minutes of total time was spent for this patient encounter, including preparation, face-to-face counseling with the patient and coordination of care, and documentation of the encounter.  Comer Dollar, MD  Division of Gynecologic Oncology  Department of Obstetrics and Gynecology  [facility] [place]  [facility]

## 2023-07-15 NOTE — Patient Instructions (Signed)
 It was good to see you today.  I do not see or feel any evidence of cancer recurrence on your exam.  I will see you for follow-up in 6 months.  I spoke with Dr. Lonn about the PET scan.  She will talk to you further about this at your next visit with her.  As always, if you develop any new and concerning symptoms before your next visit, please call to see me sooner.

## 2023-07-17 ENCOUNTER — Other Ambulatory Visit: Payer: Self-pay

## 2023-07-19 ENCOUNTER — Ambulatory Visit: Payer: Self-pay | Admitting: General Surgery

## 2023-07-20 ENCOUNTER — Telehealth: Payer: Self-pay | Admitting: Surgery

## 2023-07-20 ENCOUNTER — Encounter: Payer: Self-pay | Admitting: Hematology and Oncology

## 2023-07-20 NOTE — Telephone Encounter (Signed)
 Patient has been advised of Pre-Admission date/time, and Surgery date at [facility].  Surgery Date: 08/16/23 Preadmission Testing Date: 08/09/23 (phone 8a-1p)  Patient has been made aware to call [phone], between 1-3:00pm the day before surgery, to find out what time to arrive for surgery.

## 2023-08-04 ENCOUNTER — Inpatient Hospital Stay: Payer: BC Managed Care – PPO

## 2023-08-04 ENCOUNTER — Inpatient Hospital Stay: Payer: BC Managed Care – PPO | Admitting: Hematology and Oncology

## 2023-08-04 ENCOUNTER — Encounter: Payer: Self-pay | Admitting: Hematology and Oncology

## 2023-08-04 VITALS — BP 125/87 | HR 79 | Temp 98.1°F | Resp 18 | Ht 63.0 in | Wt 154.4 lb

## 2023-08-04 VITALS — BP 125/89 | HR 75 | Temp 98.4°F | Resp 20

## 2023-08-04 DIAGNOSIS — Z8541 Personal history of malignant neoplasm of cervix uteri: Secondary | ICD-10-CM | POA: Diagnosis not present

## 2023-08-04 DIAGNOSIS — D61818 Other pancytopenia: Secondary | ICD-10-CM | POA: Diagnosis not present

## 2023-08-04 DIAGNOSIS — C531 Malignant neoplasm of exocervix: Secondary | ICD-10-CM

## 2023-08-04 DIAGNOSIS — Z720 Tobacco use: Secondary | ICD-10-CM

## 2023-08-04 DIAGNOSIS — N939 Abnormal uterine and vaginal bleeding, unspecified: Secondary | ICD-10-CM

## 2023-08-04 DIAGNOSIS — D508 Other iron deficiency anemias: Secondary | ICD-10-CM

## 2023-08-04 LAB — CMP (CANCER CENTER ONLY)
ALT: 13 U/L (ref 0–44)
AST: 17 U/L (ref 15–41)
Albumin: 4.2 g/dL (ref 3.5–5.0)
Alkaline Phosphatase: 96 U/L (ref 38–126)
Anion gap: 7 (ref 5–15)
BUN: 15 mg/dL (ref 6–20)
CO2: 28 mmol/L (ref 22–32)
Calcium: 9.6 mg/dL (ref 8.9–10.3)
Chloride: 105 mmol/L (ref 98–111)
Creatinine: 0.87 mg/dL (ref 0.44–1.00)
GFR, Estimated: >60 mL/min (ref >60)
Glucose, Bld: 111 mg/dL — ABNORMAL HIGH (ref 70–99)
Potassium: 3.8 mmol/L (ref 3.5–5.1)
Sodium: 140 mmol/L (ref 135–145)
Total Bilirubin: 0.4 mg/dL (ref 0.0–1.2)
Total Protein: 7 g/dL (ref 6.5–8.1)

## 2023-08-04 LAB — CBC WITH DIFFERENTIAL (CANCER CENTER ONLY)
Abs Immature Granulocytes: 0.01 10*3/uL (ref 0.00–0.07)
Basophils Absolute: 0 10*3/uL (ref 0.0–0.1)
Basophils Relative: 0 %
Eosinophils Absolute: 0 10*3/uL (ref 0.0–0.5)
Eosinophils Relative: 1 %
HCT: 35.5 % — ABNORMAL LOW (ref 36.0–46.0)
Hemoglobin: 11.8 g/dL — ABNORMAL LOW (ref 12.0–15.0)
Immature Granulocytes: 0 %
Lymphocytes Relative: 20 %
Lymphs Abs: 0.6 10*3/uL — ABNORMAL LOW (ref 0.7–4.0)
MCH: 32.6 pg (ref 26.0–34.0)
MCHC: 33.2 g/dL (ref 30.0–36.0)
MCV: 98.1 fL (ref 80.0–100.0)
Monocytes Absolute: 0.2 10*3/uL (ref 0.1–1.0)
Monocytes Relative: 9 %
Neutro Abs: 1.9 10*3/uL (ref 1.7–7.7)
Neutrophils Relative %: 70 %
Platelet Count: 238 10*3/uL (ref 150–400)
RBC: 3.62 MIL/uL — ABNORMAL LOW (ref 3.87–5.11)
RDW: 13.6 % (ref 11.5–15.5)
WBC Count: 2.7 10*3/uL — ABNORMAL LOW (ref 4.0–10.5)
nRBC: 0 % (ref 0.0–0.2)

## 2023-08-04 LAB — TSH: TSH: 1.812 u[IU]/mL (ref 0.350–4.500)

## 2023-08-04 MED ORDER — SODIUM CHLORIDE 0.9% FLUSH
10.0000 mL | INTRAVENOUS | Status: DC | PRN
Start: 2023-08-04 — End: 2023-08-04
  Administered 2023-08-04: 10 mL

## 2023-08-04 MED ORDER — PEMBROLIZUMAB CHEMO INJECTION 100 MG/4ML
400.0000 mg | Freq: Once | INTRAVENOUS | Status: AC
  Administered 2023-08-04: 400 mg via INTRAVENOUS
  Filled 2023-08-04: qty 16

## 2023-08-04 MED ORDER — SODIUM CHLORIDE 0.9% FLUSH
10.0000 mL | Freq: Once | INTRAVENOUS | Status: AC
  Administered 2023-08-04: 10 mL

## 2023-08-04 MED ORDER — SODIUM CHLORIDE 0.9 % IV SOLN: Freq: Once | INTRAVENOUS | Status: AC

## 2023-08-04 MED ORDER — HEPARIN SOD (PORK) LOCK FLUSH 100 UNIT/ML IV SOLN
500.0000 [IU] | Freq: Once | INTRAVENOUS | Status: AC | PRN
  Administered 2023-08-04: 500 [IU]

## 2023-08-04 NOTE — Progress Notes (Signed)
[facility] OFFICE PROGRESS NOTE  Patient Care Team: Patient, No Pcp Per as PCP - General (General Practice) Artis Delay, MD as Consulting Physician (Hematology and Oncology) Carver Fila, MD as Consulting Physician (Gynecologic Oncology) Karie Soda, MD as Consulting Physician (General Surgery) Armbruster, Willaim Rayas, MD as Consulting Physician (Gastroenterology) Antony Blackbird, MD as Consulting Physician (Radiation Oncology)  ASSESSMENT & PLAN:  Cervical cancer [facility]) She will continue immunotherapy as scheduled Recent CT imaging from November showed positive response to therapy Due to recent infection, PET/CT imaging has not been ordered I prefer for her to complete her surgery for perforated appendix before repeating imaging study She will call me next week after she is confirmed for surgery  Pancytopenia, acquired [facility]) This is due to recent treatment and infection, dramatically improved Observe closely for now  Tobacco abuse She is attempting to quit smoking I recommend the patient to quit before her surgery  Orders Placed This Encounter  Procedures   CBC with Differential (Cancer Center Only)    Standing Status:   Future    Expected Date:   09/15/2023    Expiration Date:   09/14/2024   CMP (Cancer Center only)    Standing Status:   Future    Expected Date:   09/15/2023    Expiration Date:   09/14/2024   T4    Standing Status:   Future    Expected Date:   09/15/2023    Expiration Date:   09/14/2024   TSH    Standing Status:   Future    Expected Date:   09/15/2023    Expiration Date:   09/14/2024   CBC with Differential (Cancer Center Only)    Standing Status:   Future    Expected Date:   10/27/2023    Expiration Date:   10/26/2024   CMP (Cancer Center only)    Standing Status:   Future    Expected Date:   10/27/2023    Expiration Date:   10/26/2024   T4    Standing Status:   Future    Expected Date:   10/27/2023    Expiration Date:   10/26/2024   TSH     Standing Status:   Future    Expected Date:   10/27/2023    Expiration Date:   10/26/2024   CBC with Differential (Cancer Center Only)    Standing Status:   Future    Expected Date:   12/08/2023    Expiration Date:   12/07/2024   CMP (Cancer Center only)    Standing Status:   Future    Expected Date:   12/08/2023    Expiration Date:   12/07/2024   T4    Standing Status:   Future    Expected Date:   12/08/2023    Expiration Date:   12/07/2024   TSH    Standing Status:   Future    Expected Date:   12/08/2023    Expiration Date:   12/07/2024   CBC with Differential (Cancer Center Only)    Standing Status:   Future    Expected Date:   01/19/2024    Expiration Date:   01/18/2025   CMP (Cancer Center only)    Standing Status:   Future    Expected Date:   01/19/2024    Expiration Date:   01/18/2025   T4    Standing Status:   Future    Expected Date:   01/19/2024  Expiration Date:   01/18/2025   TSH    Standing Status:   Future    Expected Date:   01/19/2024    Expiration Date:   01/18/2025   CBC with Differential (Cancer Center Only)    Standing Status:   Future    Expected Date:   03/01/2024    Expiration Date:   03/01/2025   CMP (Cancer Center only)    Standing Status:   Future    Expected Date:   03/01/2024    Expiration Date:   03/01/2025   T4    Standing Status:   Future    Expected Date:   03/01/2024    Expiration Date:   03/01/2025   TSH    Standing Status:   Future    Expected Date:   03/01/2024    Expiration Date:   03/01/2025   CBC with Differential (Cancer Center Only)    Standing Status:   Future    Expected Date:   04/12/2024    Expiration Date:   04/12/2025   CMP (Cancer Center only)    Standing Status:   Future    Expected Date:   04/12/2024    Expiration Date:   04/12/2025   T4    Standing Status:   Future    Expected Date:   04/12/2024    Expiration Date:   04/12/2025   TSH    Standing Status:   Future    Expected Date:   04/12/2024    Expiration Date:   04/12/2025     All questions were answered. The patient knows to call the clinic with any problems, questions or concerns. The total time spent in the appointment was 20 minutes encounter with patients including review of chart and various tests results, discussions about plan of care and coordination of care plan   Artis Delay, MD 08/04/2023 10:33 AM  INTERVAL HISTORY: Please see below for problem oriented charting. she returns for chemo follow-up She denies recent fever or chills Denies abdominal pain She is tentatively scheduled for surgery on February 4 She is attempting to quit smoking  REVIEW OF SYSTEMS:   Constitutional: Denies fevers, chills or abnormal weight loss Eyes: Denies blurriness of vision Ears, nose, mouth, throat, and face: Denies mucositis or sore throat Respiratory: Denies cough, dyspnea or wheezes Cardiovascular: Denies palpitation, chest discomfort or lower extremity swelling Gastrointestinal:  Denies nausea, heartburn or change in bowel habits Skin: Denies abnormal skin rashes Lymphatics: Denies new lymphadenopathy or easy bruising Neurological:Denies numbness, tingling or new weaknesses Behavioral/Psych: Mood is stable, no new changes  All other systems were reviewed with the patient and are negative.  I have reviewed the past medical history, past surgical history, social history and family history with the patient and they are unchanged from previous note.  ALLERGIES:  is allergic to demerol [meperidine hcl] and hydromorphone.  MEDICATIONS:  No current outpatient medications on file.   No current facility-administered medications for this visit.    SUMMARY OF ONCOLOGIC HISTORY: Oncology History Overview Note  PD-L1 CPS 1%   Cervical cancer (HCC)  11/24/2022 Initial Diagnosis   The patient presented recently to the emergency department with both vaginal bleeding and rectal bleeding.  Exam in the emergency department showed a 6 cm irregular cervical mass.      11/24/2022 Imaging   CT pelvis 1. Heterogeneous cervical mass is highly worrisome for cervical carcinoma. Significant border of contact with the adjacent rectum. Difficult to exclude invasion. 2. Locule of  air within the cervical mass may be due to necrosis or extension from the vagina. Difficult to exclude a fistula to the rectum.   11/24/2022 Imaging   US pelvis 1. Large, approximately 7.6 cm vascular soft tissue mass of the lower uterine segment. This seems inseparable from the cervix and Cervical Carcinoma is not excluded. Alternatively this might be a large lower uterine fibroid. Recommend direct visualization of the cervix.   2. Elsewhere the uterus and endometrium are within normal limits. Normal ovaries and no free fluid.   11/26/2022 Pathology Results   SURGICAL PATHOLOGY  CASE: [id number]  PATIENT: Lorraine Yates  Surgical Pathology Report   FINAL MICROSCOPIC DIAGNOSIS:   A. 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  See comment.   COMMENT:   - Morphologic evaluation and p16 immunohistochemical stain reveal superficial squamous mucosa with findings consistent with high-grade squamous intraepithelial lesion.  The patient's clinical history of a cervical mass is noted, and a deeper more invasive process cannot be excluded.  Clinical and imaging correlation is recommended.      11/29/2022 Initial Diagnosis   Cervical cancer (HCC)   12/02/2022 Cancer Staging   Staging form: Cervix Uteri, AJCC Version 9 - Clinical stage from 12/02/2022: Stage IIIC1 (cT1b3, cN1, cM0) - Signed by Artis Delay, MD on 12/15/2022 Stage prefix: Initial diagnosis   12/15/2022 PET scan   1. Hypermetabolic cervical mass consistent with known cervical carcinoma. 2. There is a mildly FDG avid left pelvic sidewall lymph node which is equivocal for nodal metastasis. 3. No additional sites of disease identified. 4. Aortic Atherosclerosis (ICD10-I70.0).     12/16/2022  Procedure   Successful placement of a right internal jugular approach power injectable Port-A-Cath. The catheter is ready for immediate use.     12/17/2022 Pathology Results   SURGICAL PATHOLOGY  CASE: [id number]  PATIENT: Lorraine Yates  Surgical Pathology Report   Clinical History: malignant neoplasm of cervix, unspecified site   FINAL MICROSCOPIC DIAGNOSIS:   A. 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     12/18/2022 Imaging   1. The normal cervical anatomy is completely effaced by a bulky mass measuring 7.9 x 6.9 x 5.5 cm. Mass extends into the lower uterine segment with loss of parametrial definition about the posterior 180 degrees of the upper portion of the mass. Mass remains confined to the upper third of the vagina. 2. Preserved fat planes to the adjacent bladder and rectum. No ureteral invasion or hydronephrosis. 3. Small left pelvic sidewall lymph node measuring 0.9 x 0.6 cm, previously with equivocal FDG avidity and suspicious for a small nodal metastasis. No other enlarged lymph nodes in the pelvis.     01/20/2023 - 01/20/2023 Chemotherapy   Patient is on Treatment Plan : HEAD/NECK Cisplatin (40) q7d     01/20/2023 -  Chemotherapy   Patient is on Treatment Plan : Cervical Pembrolizumab (200) q21d, cisplatin (40) q7d + XRT / pembrolizumab (400) q42d      01/20/2023 - 04/07/2023 Radiation Therapy   Radiation treatment dates: 01/20/23 through 04/07/23 ( Pelvic IMRT : 01/20/23 through 03/03/23)  ( Brachytherapy :5 treatment sessions delivered from 03/10/23 through 04/07/23)   Site/dose:   1) Pelvic IMRT - 45 Gy delivered in 25 Fx at 1.8 Gy/Fx  2) Pelvic boost - 9 Gy delivered in 5 Fx at 1.8 Gy/Fx 3) Cervical brachytherapy -  27.5 Gy delivered in 5 Fx at 5/5 Gy/Fx    Technique/Mode:  1) IMRT / Photon  2) 3D / Photon  3) HDR Ir-192 /  Brachytherapy    04/23/2023 Imaging   1. Nonshadowing echogenic focus along the fundus of the gallbladder may represent a nonshadowing stone or polyp. No evidence for acute cholecystitis. 2. Common bile duct is upper limits of normal at 6.0 mm.     PHYSICAL EXAMINATION: ECOG PERFORMANCE STATUS: 1 - Symptomatic but completely ambulatory  Vitals:   08/04/23 1023  BP: 125/87  Pulse: 79  Resp: 18  Temp: 98.1 F (36.7 C)  SpO2: 98%   Filed Weights   08/04/23 1023  Weight: 154 lb 6.4 oz (70 kg)    GENERAL:alert, no distress and comfortable  LABORATORY DATA:  I have reviewed the data as listed    Component Value Date/Time   NA 141 06/23/2023 1237   K 4.2 06/23/2023 1237   CL 107 06/23/2023 1237   CO2 29 06/23/2023 1237   GLUCOSE 100 (H) 06/23/2023 1237   BUN 14 06/23/2023 1237   CREATININE 0.82 06/23/2023 1237   CALCIUM 9.1 06/23/2023 1237   PROT 7.3 06/23/2023 1237   ALBUMIN 4.2 06/23/2023 1237   AST 16 06/23/2023 1237   ALT 12 06/23/2023 1237   ALKPHOS 96 06/23/2023 1237   BILITOT 0.2 06/23/2023 1237   GFRNONAA >60 06/23/2023 1237    No results found for: "SPEP", "UPEP"  Lab Results  Component Value Date   WBC 2.6 (L) 06/23/2023   NEUTROABS 1.6 (L) 06/23/2023   HGB 11.7 (L) 06/23/2023   HCT 34.2 (L) 06/23/2023   MCV 97.4 06/23/2023   PLT 277 06/23/2023      Chemistry      Component Value Date/Time   NA 141 06/23/2023 1237   K 4.2 06/23/2023 1237   CL 107 06/23/2023 1237   CO2 29 06/23/2023 1237   BUN 14 06/23/2023 1237   CREATININE 0.82 06/23/2023 1237      Component Value Date/Time   CALCIUM 9.1 06/23/2023 1237   ALKPHOS 96 06/23/2023 1237   AST 16 06/23/2023 1237   ALT 12 06/23/2023 1237   BILITOT 0.2 06/23/2023 1237

## 2023-08-04 NOTE — Patient Instructions (Signed)
 [facility] - A DEPT OF [facility]  Discharge Instructions: Thank you for choosing [facility] to provide your oncology and hematology care.   If you have a lab appointment with the Cancer Center, please go directly to the Cancer Center and check in at the registration area.   Wear comfortable clothing and clothing appropriate for easy access to any Portacath or PICC line.   We strive to give you quality time with your provider. You may need to reschedule your appointment if you arrive late (15 or more minutes).  Arriving late affects you and other patients whose appointments are after yours.  Also, if you miss three or more appointments without notifying the office, you may be dismissed from the clinic at the provider's discretion.      For prescription refill requests, have your pharmacy contact our office and allow 72 hours for refills to be completed.    Today you received the following chemotherapy and/or immunotherapy agent: Pembrolizumab (Keytruda).      To help prevent nausea and vomiting after your treatment, we encourage you to take your nausea medication as directed.  BELOW ARE SYMPTOMS THAT SHOULD BE REPORTED IMMEDIATELY: *FEVER GREATER THAN 100.4 F (38 C) OR HIGHER *CHILLS OR SWEATING *NAUSEA AND VOMITING THAT IS NOT CONTROLLED WITH YOUR NAUSEA MEDICATION *UNUSUAL SHORTNESS OF BREATH *UNUSUAL BRUISING OR BLEEDING *URINARY PROBLEMS (pain or burning when urinating, or frequent urination) *BOWEL PROBLEMS (unusual diarrhea, constipation, pain near the anus) TENDERNESS IN MOUTH AND THROAT WITH OR WITHOUT PRESENCE OF ULCERS (sore throat, sores in mouth, or a toothache) UNUSUAL RASH, SWELLING OR PAIN  UNUSUAL VAGINAL DISCHARGE OR ITCHING   Items with * indicate a potential emergency and should be followed up as soon as possible or go to the Emergency Department if any problems should occur.  Please show the CHEMOTHERAPY ALERT CARD or  IMMUNOTHERAPY ALERT CARD at check-in to the Emergency Department and triage nurse.  Should you have questions after your visit or need to cancel or reschedule your appointment, please contact [facility] - A DEPT [facility]  Dept: [phone]  and follow the prompts.  Office hours are 8:00 a.m. to 4:30 p.m. Monday - Friday. Please note that voicemails left after 4:00 p.m. may not be returned until the following business day.  We are closed weekends and major holidays. You have access to a nurse at all times for urgent questions. Please call the main number to the clinic Dept: [phone] and follow the prompts.   For any non-urgent questions, you may also contact your provider using MyChart. We now offer e-Visits for anyone 68 and older to request care online for non-urgent symptoms. For details visit [email or web address].   Also download the MyChart app! Go to the app store, search "MyChart", open the app, select [facility], and log in with your MyChart username and password. Pembrolizumab Injection What is this medication? PEMBROLIZUMAB (PEM broe LIZ ue mab) treats some types of cancer. It works by helping your immune system slow or stop the spread of cancer cells. It is a monoclonal antibody. This medicine may be used for other purposes; ask your health care provider or pharmacist if you have questions. COMMON BRAND NAME(S): Keytruda What should I tell my care team before I take this medication? They need to know if you have any of these conditions: Allogeneic stem cell transplant (uses someone else's stem cells)  Autoimmune diseases, such as Crohn disease, ulcerative colitis, lupus History of chest radiation Nervous system problems, such as Guillain-Barre syndrome, myasthenia gravis Organ transplant An unusual or allergic reaction to pembrolizumab, other medications, foods, dyes, or preservatives Pregnant or trying to get pregnant Breast-feeding How  should I use this medication? This medication is injected into a vein. It is given by your care team in a hospital or clinic setting. A special MedGuide will be given to you before each treatment. Be sure to read this information carefully each time. Talk to your care team about the use of this medication in children. While it may be prescribed for children as young as 6 months for selected conditions, precautions do apply. Overdosage: If you think you have taken too much of this medicine contact a poison control center or emergency room at once. NOTE: This medicine is only for you. Do not share this medicine with others. What if I miss a dose? Keep appointments for follow-up doses. It is important not to miss your dose. Call your care team if you are unable to keep an appointment. What may interact with this medication? Interactions have not been studied. This list may not describe all possible interactions. Give your health care provider a list of all the medicines, herbs, non-prescription drugs, or dietary supplements you use. Also tell them if you smoke, drink alcohol, or use illegal drugs. Some items may interact with your medicine. What should I watch for while using this medication? Your condition will be monitored carefully while you are receiving this medication. You may need blood work while taking this medication. This medication may cause serious skin reactions. They can happen weeks to months after starting the medication. Contact your care team right away if you notice fevers or flu-like symptoms with a rash. The rash may be red or purple and then turn into blisters or peeling of the skin. You may also notice a red rash with swelling of the face, lips, or lymph nodes in your neck or under your arms. Tell your care team right away if you have any change in your eyesight. Talk to your care team if you may be pregnant. Serious birth defects can occur if you take this medication during  pregnancy and for 4 months after the last dose. You will need a negative pregnancy test before starting this medication. Contraception is recommended while taking this medication and for 4 months after the last dose. Your care team can help you find the option that works for you. Do not breastfeed while taking this medication and for 4 months after the last dose. What side effects may I notice from receiving this medication? Side effects that you should report to your care team as soon as possible: Allergic reactions--skin rash, itching, hives, swelling of the face, lips, tongue, or throat Dry cough, shortness of breath or trouble breathing Eye pain, redness, irritation, or discharge with blurry or decreased vision Heart muscle inflammation--unusual weakness or fatigue, shortness of breath, chest pain, fast or irregular heartbeat, dizziness, swelling of the ankles, feet, or hands Hormone gland problems--headache, sensitivity to light, unusual weakness or fatigue, dizziness, fast or irregular heartbeat, increased sensitivity to cold or heat, excessive sweating, constipation, hair loss, increased thirst or amount of urine, tremors or shaking, irritability Infusion reactions--chest pain, shortness of breath or trouble breathing, feeling faint or lightheaded Kidney injury (glomerulonephritis)--decrease in the amount of urine, red or dark brown urine, foamy or bubbly urine, swelling of the ankles, hands, or feet  Liver injury--right upper belly pain, loss of appetite, nausea, light-colored stool, dark yellow or brown urine, yellowing skin or eyes, unusual weakness or fatigue Pain, tingling, or numbness in the hands or feet, muscle weakness, change in vision, confusion or trouble speaking, loss of balance or coordination, trouble walking, seizures Rash, fever, and swollen lymph nodes Redness, blistering, peeling, or loosening of the skin, including inside the mouth Sudden or severe stomach pain, bloody  diarrhea, fever, nausea, vomiting Side effects that usually do not require medical attention (report to your care team if they continue or are bothersome): Bone, joint, or muscle pain Diarrhea Fatigue Loss of appetite Nausea Skin rash This list may not describe all possible side effects. Call your doctor for medical advice about side effects. You may report side effects to FDA at 1-800-FDA-1088. Where should I keep my medication? This medication is given in a hospital or clinic. It will not be stored at home. NOTE: This sheet is a summary. It may not cover all possible information. If you have questions about this medicine, talk to your doctor, pharmacist, or health care provider.  2024 Elsevier/Gold Standard (2021-11-10 00:00:00)

## 2023-08-04 NOTE — Assessment & Plan Note (Signed)
She is attempting to quit smoking I recommend the patient to quit before her surgery

## 2023-08-04 NOTE — Assessment & Plan Note (Signed)
 This is due to recent treatment and infection, dramatically improved Observe closely for now

## 2023-08-04 NOTE — Assessment & Plan Note (Signed)
She will continue immunotherapy as scheduled Recent CT imaging from November showed positive response to therapy Due to recent infection, PET/CT imaging has not been ordered I prefer for her to complete her surgery for perforated appendix before repeating imaging study She will call me next week after she is confirmed for surgery

## 2023-08-05 LAB — T4: T4, Total: 6.2 ug/dL (ref 4.5–12.0)

## 2023-08-09 ENCOUNTER — Encounter
Admission: RE | Admit: 2023-08-09 | Discharge: 2023-08-09 | Disposition: A | Discharge status: Home / Self Care | Payer: BC Managed Care – PPO | Source: Ambulatory Visit | Attending: Surgery | Admitting: Surgery

## 2023-08-09 ENCOUNTER — Other Ambulatory Visit: Payer: Self-pay

## 2023-08-09 ENCOUNTER — Telehealth: Payer: Self-pay

## 2023-08-09 NOTE — Telephone Encounter (Signed)
She called and left a message. Her surgery is scheduled on 2/4 and she does not have to pay a co-pay.  FYI

## 2023-08-09 NOTE — Patient Instructions (Addendum)
Layla you do not have to do that bowel prep (Miralax and stool softener) for this surgery with Dr Aleen Campi. He Is just removing the appendix. Questions call his office at [phone]  Your procedure is scheduled on: Tuesday 08/16/23 To find out your arrival time, please call [phone] between 1PM - 3PM on:  Monday 08/15/23  Report to the Registration Desk on the 1st floor of the [facility]. ([address] [place]. Look for large metal angel statue out front) FREE Valet parking is available.  If your arrival time is 6:00 am, do not arrive before that time as the [facility] entrance doors do not open until 6:00 am.  REMEMBER: Instructions that are not followed completely may result in serious medical risk, up to and including death; or upon the discretion of your surgeon and anesthesiologist your surgery may need to be rescheduled.  Do not eat food after midnight the night before surgery.  No gum chewing or hard candies.  You may however, drink CLEAR liquids up to 2 hours before you are scheduled to arrive for your surgery. Do not drink anything within 2 hours of your scheduled arrival time.  Clear liquids include: - water  - apple juice without pulp - gatorade (not RED colors) - black coffee or tea (Do NOT add milk or creamers to the coffee or tea) Do NOT drink anything that is not on this list.  Type 1 and Type 2 diabetics should only drink water.  In addition, your doctor has ordered for you to drink the provided:  Ensure Pre-Surgery Clear Carbohydrate Drink  Gatorade G2 Drinking this carbohydrate drink up to two hours before surgery helps to reduce insulin resistance and improve patient outcomes. Please complete drinking 2 hours before scheduled arrival time.  One week prior to surgery: Stop Anti-inflammatories (NSAIDS) such as Advil, Aleve, Ibuprofen, Motrin, Naproxen, Naprosyn and Aspirin based products such as Excedrin, Goody's Powder, BC Powder. You may however,  continue to take Tylenol if needed for pain up until the day of surgery.  Stop ANY OVER THE COUNTER supplements and vitamins until after surgery.  Continue taking all prescribed medications.  Bowel prep not necessary per Dr Aleen Campi.  TAKE ONLY THESE MEDICATIONS THE MORNING OF SURGERY WITH A SIP OF WATER:  none  No Alcohol for 24 hours before or after surgery.  No Smoking including e-cigarettes for 24 hours before surgery.  No chewable tobacco products for at least 6 hours before surgery.  No nicotine patches on the day of surgery.  Do not use any "recreational" drugs for at least a week (preferably 2 weeks) before your surgery.  Please be advised that the combination of cocaine and anesthesia may have negative outcomes, up to and including death. If you test positive for cocaine, your surgery will be cancelled.  On the morning of surgery brush your teeth with toothpaste and water, you may rinse your mouth with mouthwash if you wish. Do not swallow any toothpaste or mouthwash.  Use CHG Soap or wipes as directed on instruction sheet. Can be picked up at our office at [facility] [address]  Do not wear lotions, powders, or perfumes.   Do not shave body hair from the neck down 48 hours before surgery.  Wear clean comfortable clothing (specific to your surgery type) to the hospital.  Do not wear jewelry, make-up, hairpins, clips or nail polish.  For welded (permanent) jewelry: bracelets, anklets, waist bands, etc.  Please have  this removed prior to surgery.  If it is not removed, there is a chance that hospital personnel will need to cut it off on the day of surgery. Contact lenses, hearing aids and dentures may not be worn into surgery.  Do not bring valuables to the hospital. [facility] is not responsible for any missing/lost belongings or valuables.   Notify your doctor if there is any change in your medical condition (cold, fever, infection).  If you are  being discharged the day of surgery, you will not be allowed to drive home. You will need a responsible individual to drive you home and stay with you for 24 hours after surgery.   If you are taking public transportation, you will need to have a responsible individual with you.  If you are being admitted to the hospital overnight, leave your suitcase in the car. After surgery it may be brought to your room.  In case of increased patient census, it may be necessary for you, the patient, to continue your postoperative care in the Same Day Surgery department.  After surgery, you can help prevent lung complications by doing breathing exercises.  Take deep breaths and cough every 1-2 hours. Your doctor may order a device called an Incentive Spirometer to help you take deep breaths. When coughing or sneezing, hold a pillow firmly against your incision with both hands. This is called "splinting." Doing this helps protect your incision. It also decreases belly discomfort.  Surgery Visitation Policy:  Patients undergoing a surgery or procedure may have two family members or support persons with them as long as the person is not COVID-19 positive or experiencing its symptoms.   Inpatient Visitation:    Visiting hours are 7 a.m. to 8 p.m. Up to four visitors are allowed at one time in a patient room. The visitors may rotate out with other people during the day. One designated support person (adult) may remain overnight.  Due to an increase in RSV and influenza rates and associated hospitalizations, children ages 21 and under will not be able to visit patients in [facility]. Masks continue to be strongly recommended.  Please call the Pre-admissions Testing Dept. at [phone] if you have any questions about these instructions.     Preparing for Surgery with CHLORHEXIDINE GLUCONATE (CHG) Soap  Chlorhexidine Gluconate (CHG) Soap  o An antiseptic cleaner that kills germs and bonds  with the skin to continue killing germs even after washing  o Used for showering the night before surgery and morning of surgery  Before surgery, you can play an important role by reducing the number of germs on your skin.  CHG (Chlorhexidine gluconate) soap is an antiseptic cleanser which kills germs and bonds with the skin to continue killing germs even after washing.  Please do not use if you have an allergy to CHG or antibacterial soaps. If your skin becomes reddened/irritated stop using the CHG.  1. Shower the NIGHT BEFORE SURGERY and the MORNING OF SURGERY with CHG soap.  2. If you choose to wash your hair, wash your hair first as usual with your normal shampoo.  3. After shampooing, rinse your hair and body thoroughly to remove the shampoo.  4. Use CHG as you would any other liquid soap. You can apply CHG directly to the skin and wash gently with a scrungie or a clean washcloth.  5. Apply the CHG soap to your body only from the neck down. Do not use on open wounds or  open sores. Avoid contact with your eyes, ears, mouth, and genitals (private parts). Wash face and genitals (private parts) with your normal soap.  6. Wash thoroughly, paying special attention to the area where your surgery will be performed.  7. Thoroughly rinse your body with warm water.  8. Do not shower/wash with your normal soap after using and rinsing off the CHG soap.  9. Pat yourself dry with a clean towel.  10. Wear clean pajamas to bed the night before surgery.  12. Place clean sheets on your bed the night of your first shower and do not sleep with pets.  13. Shower again with the CHG soap on the day of surgery prior to arriving at the hospital.  14. Do not apply any deodorants/lotions/powders.  15. Please wear clean clothes to the hospital.

## 2023-08-10 ENCOUNTER — Encounter: Payer: Self-pay | Admitting: Surgery

## 2023-08-10 ENCOUNTER — Ambulatory Visit (INDEPENDENT_AMBULATORY_CARE_PROVIDER_SITE_OTHER): Payer: BC Managed Care – PPO | Admitting: Surgery

## 2023-08-10 VITALS — BP 122/84 | HR 88 | Temp 99.4°F | Ht 63.0 in | Wt 152.8 lb

## 2023-08-10 DIAGNOSIS — K35211 Acute appendicitis with generalized peritonitis, with perforation and abscess: Secondary | ICD-10-CM

## 2023-08-10 DIAGNOSIS — K3533 Acute appendicitis with perforation and localized peritonitis, with abscess: Secondary | ICD-10-CM

## 2023-08-10 NOTE — H&P (View-Only) (Signed)
 08/10/2023  History of Present Illness: Lorraine Yates is a 53 y.o. female presenting for H&P update for upcoming robotic appendectomy.  The patient has a history of perforated appendicitis with abscess requiring percutaneous drainage on 04/26/23.  She reports that she's doing well and denies any abdominal pain, nausea, or vomiting.  Denies any new symptoms or concerns.  She's scheduled for robotic appendectomy on 08/16/23.    Past Medical History: Past Medical History:  Diagnosis Date   Acquired pancytopenia (HCC)    Anemia    Blood transfusion without reported diagnosis    History of cancer chemotherapy    cervical cancer  01-20-2023  to 02-28-2023   History of radiation therapy    Cervix- 01/20/23-04/07/23-Dr. 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   oncologist--- dr gorsuch/  radiation oncologist--- dr Roselind Messier;  dx 05/ 2024;   chemo 01-20-2023 to 02-28-2023;   IMRT 01-20-2023 to 03-03-2023  to start high dose radiation 03-10-2023 w/ tandem   Port-A-Cath in place 12/16/2022     Past Surgical History: Past Surgical History:  Procedure Laterality Date   IR IMAGING GUIDED PORT INSERTION  12/16/2022   IR RADIOLOGIST EVAL & MGMT  05/18/2023   OPERATIVE ULTRASOUND N/A 03/10/2023   Procedure: OPERATIVE ULTRASOUND;  Surgeon: Antony Blackbird, MD;  Location: [facility];  Service: Urology;  Laterality: N/A;   OPERATIVE ULTRASOUND N/A 03/15/2023   Procedure: OPERATIVE ULTRASOUND;  Surgeon: Antony Blackbird, MD;  Location: [facility];  Service: Urology;  Laterality: N/A;   OPERATIVE ULTRASOUND N/A 03/21/2023   Procedure: OPERATIVE ULTRASOUND;  Surgeon: Antony Blackbird, MD;  Location: [facility];  Service: Urology;  Laterality: N/A;   OPERATIVE ULTRASOUND N/A 03/31/2023   Procedure: OPERATIVE ULTRASOUND;  Surgeon: Antony Blackbird, MD;  Location: [facility];  Service: Urology;  Laterality: N/A;   OPERATIVE  ULTRASOUND N/A 04/07/2023   Procedure: OPERATIVE ULTRASOUND;  Surgeon: Antony Blackbird, MD;  Location: [facility];  Service: Urology;  Laterality: N/A;   TANDEM RING INSERTION N/A 03/10/2023   Procedure: TANDEM RING INSERTION;  Surgeon: Antony Blackbird, MD;  Location: [facility];  Service: Urology;  Laterality: N/A;   TANDEM RING INSERTION N/A 03/15/2023   Procedure: TANDEM RING INSERTION;  Surgeon: Antony Blackbird, MD;  Location: [facility];  Service: Urology;  Laterality: N/A;   TANDEM RING INSERTION N/A 03/21/2023   Procedure: TANDEM RING INSERTION;  Surgeon: Antony Blackbird, MD;  Location: [facility];  Service: Urology;  Laterality: N/A;   TANDEM RING INSERTION N/A 03/31/2023   Procedure: TANDEM RING INSERTION;  Surgeon: Antony Blackbird, MD;  Location: [facility];  Service: Urology;  Laterality: N/A;   TANDEM RING INSERTION N/A 04/07/2023   Procedure: TANDEM RING INSERTION;  Surgeon: Antony Blackbird, MD;  Location: [facility];  Service: Urology;  Laterality: N/A;    Home Medications: Prior to Admission medications   Not on File    Allergies: Allergies  Allergen Reactions   Demerol [Meperidine Hcl] Itching, Rash and Other (See Comments)    Redness and itching to IV insertion site    Hydromorphone Rash    Redness and itching to IV insertion site that extended up left arm 03/10/23    Review of Systems: Review of Systems  Constitutional:  Negative for chills and fever.  Respiratory:  Negative for shortness of breath.   Cardiovascular:  Negative for chest pain.  Gastrointestinal:  Negative for abdominal pain, nausea and vomiting.    Physical Exam BP 122/84   Pulse 88   Temp 99.4 F (37.4 C) (Oral)   Ht 5\' 3"  (1.6 m)   Wt 152 lb 12.8 oz (69.3 kg)   SpO2 99%   BMI 27.07 kg/m  CONSTITUTIONAL: No acute distress, well nourished. HEENT:  Normocephalic, atraumatic, extraocular motion  intact. RESPIRATORY:  Lungs are clear, and breath sounds are equal bilaterally. Normal respiratory effort without pathologic use of accessory muscles. CARDIOVASCULAR: Heart is regular without murmurs, gallops, or rubs. GI: The abdomen is soft, non-distended, non-tender to palpation.  NEUROLOGIC:  Motor and sensation is grossly normal.  Cranial nerves are grossly intact. PSYCH:  Alert and oriented to person, place and time. Affect is normal.  Labs/Imaging: Labs from 08/04/23: Na 140, K 3.8, Cl 105, CO2 28, BUN 15, Cr 0.87.  LFTs within normal.  WBC 2.7, Hgb 11.8, Hct 35.5, Plt 238.  Assessment and Plan: This is a 53 y.o. female with history of acute appendicitis with abscess.  --Patient is currently scheduled for robotic appendectomy on 08/16/23.  Discussed with her again the surgical plan and reviewed the surgery at length.  She's willing to proceed.  No bowel prep needed. --All of her questions have been answered.  I spent 20 minutes dedicated to the care of this patient on the date of this encounter to include pre-visit review of records, face-to-face time with the patient discussing diagnosis and management, and any post-visit coordination of care.   Howie Ill, MD [facility]

## 2023-08-10 NOTE — Progress Notes (Signed)
08/10/2023  History of Present Illness: Lorraine Yates is a 53 y.o. female presenting for H&P update for upcoming robotic appendectomy.  The patient has a history of perforated appendicitis with abscess requiring percutaneous drainage on 04/26/23.  She reports that she's doing well and denies any abdominal pain, nausea, or vomiting.  Denies any new symptoms or concerns.  She's scheduled for robotic appendectomy on 08/16/23.    Past Medical History: Past Medical History:  Diagnosis Date   Acquired pancytopenia (HCC)    Anemia    Blood transfusion without reported diagnosis    History of cancer chemotherapy    cervical cancer  01-20-2023  to 02-28-2023   History of radiation therapy    Cervix- 01/20/23-04/07/23-Dr. 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   oncologist--- dr gorsuch/  radiation oncologist--- dr Roselind Messier;  dx 05/ 2024;   chemo 01-20-2023 to 02-28-2023;   IMRT 01-20-2023 to 03-03-2023  to start high dose radiation 03-10-2023 w/ tandem   Port-A-Cath in place 12/16/2022     Past Surgical History: Past Surgical History:  Procedure Laterality Date   IR IMAGING GUIDED PORT INSERTION  12/16/2022   IR RADIOLOGIST EVAL & MGMT  05/18/2023   OPERATIVE ULTRASOUND N/A 03/10/2023   Procedure: OPERATIVE ULTRASOUND;  Surgeon: Antony Blackbird, MD;  Location: [facility];  Service: Urology;  Laterality: N/A;   OPERATIVE ULTRASOUND N/A 03/15/2023   Procedure: OPERATIVE ULTRASOUND;  Surgeon: Antony Blackbird, MD;  Location: [facility];  Service: Urology;  Laterality: N/A;   OPERATIVE ULTRASOUND N/A 03/21/2023   Procedure: OPERATIVE ULTRASOUND;  Surgeon: Antony Blackbird, MD;  Location: [facility];  Service: Urology;  Laterality: N/A;   OPERATIVE ULTRASOUND N/A 03/31/2023   Procedure: OPERATIVE ULTRASOUND;  Surgeon: Antony Blackbird, MD;  Location: [facility];  Service: Urology;  Laterality: N/A;   OPERATIVE  ULTRASOUND N/A 04/07/2023   Procedure: OPERATIVE ULTRASOUND;  Surgeon: Antony Blackbird, MD;  Location: [facility];  Service: Urology;  Laterality: N/A;   TANDEM RING INSERTION N/A 03/10/2023   Procedure: TANDEM RING INSERTION;  Surgeon: Antony Blackbird, MD;  Location: [facility];  Service: Urology;  Laterality: N/A;   TANDEM RING INSERTION N/A 03/15/2023   Procedure: TANDEM RING INSERTION;  Surgeon: Antony Blackbird, MD;  Location: [facility];  Service: Urology;  Laterality: N/A;   TANDEM RING INSERTION N/A 03/21/2023   Procedure: TANDEM RING INSERTION;  Surgeon: Antony Blackbird, MD;  Location: [facility];  Service: Urology;  Laterality: N/A;   TANDEM RING INSERTION N/A 03/31/2023   Procedure: TANDEM RING INSERTION;  Surgeon: Antony Blackbird, MD;  Location: [facility];  Service: Urology;  Laterality: N/A;   TANDEM RING INSERTION N/A 04/07/2023   Procedure: TANDEM RING INSERTION;  Surgeon: Antony Blackbird, MD;  Location: [facility];  Service: Urology;  Laterality: N/A;    Home Medications: Prior to Admission medications   Not on File    Allergies: Allergies  Allergen Reactions   Demerol [Meperidine Hcl] Itching, Rash and Other (See Comments)    Redness and itching to IV insertion site    Hydromorphone Rash    Redness and itching to IV insertion site that extended up left arm 03/10/23    Review of Systems: Review of Systems  Constitutional:  Negative for chills and fever.  Respiratory:  Negative for shortness of breath.   Cardiovascular:  Negative for chest pain.  Gastrointestinal:  Negative for abdominal pain, nausea and vomiting.    Physical Exam BP 122/84   Pulse 88   Temp 99.4 F (37.4 C) (Oral)   Ht 5\' 3"  (1.6 m)   Wt 152 lb 12.8 oz (69.3 kg)   SpO2 99%   BMI 27.07 kg/m  CONSTITUTIONAL: No acute distress, well nourished. HEENT:  Normocephalic, atraumatic, extraocular motion  intact. RESPIRATORY:  Lungs are clear, and breath sounds are equal bilaterally. Normal respiratory effort without pathologic use of accessory muscles. CARDIOVASCULAR: Heart is regular without murmurs, gallops, or rubs. GI: The abdomen is soft, non-distended, non-tender to palpation.  NEUROLOGIC:  Motor and sensation is grossly normal.  Cranial nerves are grossly intact. PSYCH:  Alert and oriented to person, place and time. Affect is normal.  Labs/Imaging: Labs from 08/04/23: Na 140, K 3.8, Cl 105, CO2 28, BUN 15, Cr 0.87.  LFTs within normal.  WBC 2.7, Hgb 11.8, Hct 35.5, Plt 238.  Assessment and Plan: This is a 53 y.o. female with history of acute appendicitis with abscess.  --Patient is currently scheduled for robotic appendectomy on 08/16/23.  Discussed with her again the surgical plan and reviewed the surgery at length.  She's willing to proceed.  No bowel prep needed. --All of her questions have been answered.  I spent 20 minutes dedicated to the care of this patient on the date of this encounter to include pre-visit review of records, face-to-face time with the patient discussing diagnosis and management, and any post-visit coordination of care.   Howie Ill, MD [facility]

## 2023-08-10 NOTE — Patient Instructions (Signed)
We will schedule you for an elective Appendectomy. This will be done at [facility] by Dr. Aleen Campi. Please see the following information regarding your surgery.   Typically, our patients are out of work 1-2 weeks following the surgery and may return with a lifting restriction of no more than 15 lbs. For a complete 6 weeks following surgery.  If you have FMLA or Disability paperwork that needs to be filled out, please have your company fax your paperwork to [phone] or you may drop this by either office. This paperwork will be filled out within 3 days after your surgery has been completed.  Also, please review the [facility]) Pre-Care sheet for further information regarding your surgery. Our surgery schedule will call you to verify surgery date and to go over information.  If you have any questions, please do not hesitate to contact our office.  Laparoscopic Appendectomy, Adult, Care After Refer to this sheet in the next few weeks. These instructions provide you with information about caring for yourself after your procedure. Your health care provider may also give you more specific instructions. Your treatment has been planned according to current medical practices, but problems sometimes occur. Call your health care provider if you have any problems or questions after your procedure. What can I expect after the procedure? After the procedure, it is common to have: A decrease in your energy level. Mild pain in the area where the surgical cuts (incisions) were made. Constipation. This can be caused by pain medicine and a decrease in your activity.  Follow these instructions at home: Medicines Take over-the-counter and prescription medicines only as told by your health care provider. Do not drive for 24 hours if you received a sedative. Do not drive or operate heavy machinery while taking prescription pain medicine. If you were prescribed an antibiotic medicine, take it as told by your health care  provider. Do not stop taking the antibiotic even if you start to feel better. Activity For 3 weeks or as long as told by your health care provider: Do not lift anything that is heavier than 10 pounds (4.5 kg). Do not play contact sports. Gradually return to your normal activities. Ask your health care provider what activities are safe for you. Bathing Keep your incisions clean and dry. Clean them as often as told by your health care provider: Gently wash the incisions with soap and water. Rinse the incisions with water to remove all soap. Pat the incisions dry with a clean towel. Do not rub the incisions. You may take showers after 48 hours. Do not take baths, swim, or use hot tubs for 2 weeks or as told by your health care provider. Incision care Follow instructions from your healthcare provider about how to take care of your incisions. Make sure you: Wash your hands with soap and water before you change your bandage (dressing). If soap and water are not available, use hand sanitizer. Change your dressing as told by your health care provider. Leave stitches (sutures), skin glue, or adhesive strips in place. These skin closures may need to stay in place for 2 weeks or longer. If adhesive strip edges start to loosen and curl up, you may trim the loose edges. Do not remove adhesive strips completely unless your health care provider tells you to do that. Check your incision areas every day for signs of infection. Check for: More redness, swelling, or pain. More fluid or blood. Warmth. Pus or a bad smell. Other Instructions If you were  sent home with a drain, follow instructions from your health care provider about how to care for the drain and how to empty it. Take deep breaths. This helps to prevent your lungs from becoming inflamed. To relieve and prevent constipation: Drink plenty of fluids. Eat plenty of fruits and vegetables. Keep all follow-up visits as told by your health care  provider. This is important. Contact a health care provider if: You have more redness, swelling, or pain around an incision. You have more fluid or blood coming from an incision. Your incision feels warm to the touch. You have pus or a bad smell coming from an incision or dressing. Your incision edges break open after your sutures have been removed. You have increasing pain in your shoulders. You feel dizzy or you faint. You develop shortness of breath. You keep feeling nauseous or vomiting. You have diarrhea or you cannot control your bowel functions. You lose your appetite. You develop swelling or pain in your legs. Get help right away if: You have a fever. You develop a rash. You have difficulty breathing. You have sharp pains in your chest. This information is not intended to replace advice given to you by your health care provider. Make sure you discuss any questions you have with your health care provider. Document Released: 06/28/2005 Document Revised: 11/28/2015 Document Reviewed: 12/16/2014 Elsevier Interactive Patient Education  2017 [employer].

## 2023-08-13 HISTORY — PX: APPENDECTOMY: SHX54

## 2023-08-16 ENCOUNTER — Encounter
Admission: RE | Disposition: A | Discharge status: Home / Self Care | Payer: Self-pay | Source: Home / Self Care | Attending: Surgery

## 2023-08-16 ENCOUNTER — Ambulatory Visit: Payer: BC Managed Care – PPO | Admitting: Urgent Care

## 2023-08-16 ENCOUNTER — Ambulatory Visit
Admission: RE | Admit: 2023-08-16 | Discharge: 2023-08-16 | Disposition: A | Discharge status: Home / Self Care | Payer: BC Managed Care – PPO | Attending: Surgery | Admitting: Surgery

## 2023-08-16 ENCOUNTER — Other Ambulatory Visit: Payer: Self-pay

## 2023-08-16 ENCOUNTER — Ambulatory Visit: Payer: BC Managed Care – PPO | Admitting: Anesthesiology

## 2023-08-16 ENCOUNTER — Encounter: Payer: Self-pay | Admitting: Surgery

## 2023-08-16 DIAGNOSIS — F17200 Nicotine dependence, unspecified, uncomplicated: Secondary | ICD-10-CM | POA: Diagnosis not present

## 2023-08-16 DIAGNOSIS — K3533 Acute appendicitis with perforation and localized peritonitis, with abscess: Secondary | ICD-10-CM | POA: Diagnosis present

## 2023-08-16 DIAGNOSIS — Z01818 Encounter for other preprocedural examination: Secondary | ICD-10-CM

## 2023-08-16 DIAGNOSIS — K35211 Acute appendicitis with generalized peritonitis, with perforation and abscess: Secondary | ICD-10-CM

## 2023-08-16 HISTORY — PX: XI ROBOTIC LAPAROSCOPIC ASSISTED APPENDECTOMY: SHX6877

## 2023-08-16 SURGERY — APPENDECTOMY, ROBOT-ASSISTED, LAPAROSCOPIC
Anesthesia: General

## 2023-08-16 MED ORDER — OXYCODONE HCL 5 MG PO TABS: 5.0000 mg | ORAL_TABLET | ORAL | 0 refills | Status: DC | PRN

## 2023-08-16 MED ORDER — FENTANYL CITRATE (PF) 100 MCG/2ML IJ SOLN: 25.0000 ug | INTRAMUSCULAR | Status: DC | PRN

## 2023-08-16 MED ORDER — FENTANYL CITRATE (PF) 100 MCG/2ML IJ SOLN
INTRAMUSCULAR | Status: AC
  Filled 2023-08-16: qty 2

## 2023-08-16 MED ORDER — OXYCODONE HCL 5 MG PO TABS
ORAL_TABLET | ORAL | Status: AC
  Filled 2023-08-16: qty 1

## 2023-08-16 MED ORDER — PROPOFOL 10 MG/ML IV BOLUS
INTRAVENOUS | Status: AC
  Filled 2023-08-16: qty 20

## 2023-08-16 MED ORDER — LIDOCAINE HCL (PF) 2 % IJ SOLN
INTRAMUSCULAR | Status: AC
  Filled 2023-08-16: qty 5

## 2023-08-16 MED ORDER — GABAPENTIN 300 MG PO CAPS
ORAL_CAPSULE | ORAL | Status: AC
  Filled 2023-08-16: qty 1

## 2023-08-16 MED ORDER — SUGAMMADEX SODIUM 200 MG/2ML IV SOLN
INTRAVENOUS | Status: DC | PRN
  Administered 2023-08-16: 200 mg via INTRAVENOUS

## 2023-08-16 MED ORDER — MIDAZOLAM HCL 2 MG/2ML IJ SOLN
INTRAMUSCULAR | Status: AC
  Filled 2023-08-16: qty 2

## 2023-08-16 MED ORDER — KETOROLAC TROMETHAMINE 30 MG/ML IJ SOLN
INTRAMUSCULAR | Status: DC | PRN
  Administered 2023-08-16: 30 mg via INTRAVENOUS

## 2023-08-16 MED ORDER — BUPIVACAINE-EPINEPHRINE (PF) 0.5% -1:200000 IJ SOLN
INTRAMUSCULAR | Status: DC | PRN
  Administered 2023-08-16: 30 mL

## 2023-08-16 MED ORDER — SODIUM CHLORIDE 0.9 % IV SOLN
2.0000 g | INTRAVENOUS | Status: AC
  Administered 2023-08-16: 2 g via INTRAVENOUS

## 2023-08-16 MED ORDER — BUPIVACAINE LIPOSOME 1.3 % IJ SUSP: 20.0000 mL | Freq: Once | INTRAMUSCULAR | Status: DC

## 2023-08-16 MED ORDER — BUPIVACAINE HCL (PF) 0.5 % IJ SOLN
INTRAMUSCULAR | Status: AC
  Filled 2023-08-16: qty 30

## 2023-08-16 MED ORDER — OXYCODONE HCL 5 MG PO TABS
5.0000 mg | ORAL_TABLET | Freq: Once | ORAL | Status: AC | PRN
  Administered 2023-08-16: 5 mg via ORAL

## 2023-08-16 MED ORDER — EPHEDRINE 5 MG/ML INJ
INTRAVENOUS | Status: AC
  Filled 2023-08-16: qty 5

## 2023-08-16 MED ORDER — KETOROLAC TROMETHAMINE 30 MG/ML IJ SOLN
INTRAMUSCULAR | Status: AC
  Filled 2023-08-16: qty 1

## 2023-08-16 MED ORDER — MIDAZOLAM HCL 2 MG/2ML IJ SOLN
INTRAMUSCULAR | Status: DC | PRN
  Administered 2023-08-16: 2 mg via INTRAVENOUS

## 2023-08-16 MED ORDER — ONDANSETRON HCL 4 MG/2ML IJ SOLN: 4.0000 mg | Freq: Once | INTRAMUSCULAR | Status: DC | PRN

## 2023-08-16 MED ORDER — LIDOCAINE HCL (CARDIAC) PF 100 MG/5ML IV SOSY
PREFILLED_SYRINGE | INTRAVENOUS | Status: DC | PRN
  Administered 2023-08-16: 100 mg via INTRAVENOUS

## 2023-08-16 MED ORDER — ROCURONIUM BROMIDE 10 MG/ML (PF) SYRINGE
PREFILLED_SYRINGE | INTRAVENOUS | Status: AC
  Filled 2023-08-16: qty 10

## 2023-08-16 MED ORDER — ORAL CARE MOUTH RINSE: 15.0000 mL | Freq: Once | OROMUCOSAL | Status: AC

## 2023-08-16 MED ORDER — ACETAMINOPHEN 500 MG PO TABS
ORAL_TABLET | ORAL | Status: AC
  Filled 2023-08-16: qty 2

## 2023-08-16 MED ORDER — PROPOFOL 10 MG/ML IV BOLUS
INTRAVENOUS | Status: DC | PRN
  Administered 2023-08-16: 150 mg via INTRAVENOUS

## 2023-08-16 MED ORDER — DEXAMETHASONE SODIUM PHOSPHATE 10 MG/ML IJ SOLN
INTRAMUSCULAR | Status: AC
  Filled 2023-08-16: qty 1

## 2023-08-16 MED ORDER — ACETAMINOPHEN 500 MG PO TABS: 1000.0000 mg | ORAL_TABLET | Freq: Four times a day (QID) | ORAL | Status: AC | PRN

## 2023-08-16 MED ORDER — LACTATED RINGERS IV SOLN: INTRAVENOUS | Status: DC

## 2023-08-16 MED ORDER — ONDANSETRON HCL 4 MG/2ML IJ SOLN
INTRAMUSCULAR | Status: DC | PRN
  Administered 2023-08-16: 4 mg via INTRAVENOUS

## 2023-08-16 MED ORDER — FENTANYL CITRATE (PF) 100 MCG/2ML IJ SOLN
INTRAMUSCULAR | Status: DC | PRN
  Administered 2023-08-16 (×2): 25 ug via INTRAVENOUS
  Administered 2023-08-16: 50 ug via INTRAVENOUS

## 2023-08-16 MED ORDER — CHLORHEXIDINE GLUCONATE CLOTH 2 % EX PADS
6.0000 | MEDICATED_PAD | Freq: Once | CUTANEOUS | Status: AC
  Administered 2023-08-16: 6 via TOPICAL

## 2023-08-16 MED ORDER — IBUPROFEN 600 MG PO TABS: 600.0000 mg | ORAL_TABLET | Freq: Three times a day (TID) | ORAL | 1 refills | Status: DC | PRN

## 2023-08-16 MED ORDER — CHLORHEXIDINE GLUCONATE 0.12 % MT SOLN
15.0000 mL | Freq: Once | OROMUCOSAL | Status: AC
  Administered 2023-08-16: 15 mL via OROMUCOSAL

## 2023-08-16 MED ORDER — PHENYLEPHRINE 80 MCG/ML (10ML) SYRINGE FOR IV PUSH (FOR BLOOD PRESSURE SUPPORT)
PREFILLED_SYRINGE | INTRAVENOUS | Status: AC
  Filled 2023-08-16: qty 10

## 2023-08-16 MED ORDER — CHLORHEXIDINE GLUCONATE 0.12 % MT SOLN
OROMUCOSAL | Status: AC
  Filled 2023-08-16: qty 15

## 2023-08-16 MED ORDER — ROCURONIUM BROMIDE 100 MG/10ML IV SOLN
INTRAVENOUS | Status: DC | PRN
  Administered 2023-08-16: 30 mg via INTRAVENOUS
  Administered 2023-08-16: 40 mg via INTRAVENOUS

## 2023-08-16 MED ORDER — OXYCODONE HCL 5 MG/5ML PO SOLN: 5.0000 mg | Freq: Once | ORAL | Status: AC | PRN

## 2023-08-16 MED ORDER — GABAPENTIN 300 MG PO CAPS
300.0000 mg | ORAL_CAPSULE | ORAL | Status: AC
  Administered 2023-08-16: 300 mg via ORAL

## 2023-08-16 MED ORDER — BUPIVACAINE LIPOSOME 1.3 % IJ SUSP
INTRAMUSCULAR | Status: DC | PRN
  Administered 2023-08-16: 10 mL

## 2023-08-16 MED ORDER — ONDANSETRON HCL 4 MG/2ML IJ SOLN
INTRAMUSCULAR | Status: AC
  Filled 2023-08-16: qty 2

## 2023-08-16 MED ORDER — DEXAMETHASONE SODIUM PHOSPHATE 10 MG/ML IJ SOLN
INTRAMUSCULAR | Status: DC | PRN
  Administered 2023-08-16: 8 mg via INTRAVENOUS

## 2023-08-16 MED ORDER — BUPIVACAINE LIPOSOME 1.3 % IJ SUSP
INTRAMUSCULAR | Status: AC
  Filled 2023-08-16: qty 10

## 2023-08-16 MED ORDER — GLYCOPYRROLATE 0.2 MG/ML IJ SOLN
INTRAMUSCULAR | Status: DC | PRN
  Administered 2023-08-16: .2 mg via INTRAVENOUS

## 2023-08-16 MED ORDER — SODIUM CHLORIDE 0.9 % IV SOLN
INTRAVENOUS | Status: AC
  Filled 2023-08-16: qty 2

## 2023-08-16 MED ORDER — ACETAMINOPHEN 500 MG PO TABS
1000.0000 mg | ORAL_TABLET | ORAL | Status: AC
  Administered 2023-08-16: 1000 mg via ORAL

## 2023-08-16 SURGICAL SUPPLY — 46 items
BAG PRESSURE INF REUSE 1000 (BAG) IMPLANT
COVER WAND RF STERILE (DRAPES) ×1 IMPLANT
DERMABOND ADVANCED .7 DNX12 (GAUZE/BANDAGES/DRESSINGS) ×1 IMPLANT
DRAPE ARM DVNC X/XI (DISPOSABLE) ×3 IMPLANT
DRAPE COLUMN DVNC XI (DISPOSABLE) ×1 IMPLANT
ELECT CAUTERY BLADE TIP 2.5 (TIP) ×1
ELECT REM PT RETURN 9FT ADLT (ELECTROSURGICAL) ×1
ELECTRODE CAUTERY BLDE TIP 2.5 (TIP) IMPLANT
ELECTRODE REM PT RTRN 9FT ADLT (ELECTROSURGICAL) ×1 IMPLANT
FORCEPS BPLR R/ABLATION 8 DVNC (INSTRUMENTS) ×1 IMPLANT
GLOVE SURG SYN 7.0 (GLOVE) ×3
GLOVE SURG SYN 7.0 PF PI (GLOVE) ×2 IMPLANT
GLOVE SURG SYN 7.5 E (GLOVE) ×3
GLOVE SURG SYN 7.5 PF PI (GLOVE) ×2 IMPLANT
GOWN STRL REUS W/ TWL LRG LVL3 (GOWN DISPOSABLE) ×3 IMPLANT
GRASPER SUT TROCAR 14GX15 (MISCELLANEOUS) IMPLANT
IRRIGATOR SUCT 8 DISP DVNC XI (IRRIGATION / IRRIGATOR) IMPLANT
IV NS 1000ML BAXH (IV SOLUTION) IMPLANT
KIT PINK PAD W/HEAD ARE REST (MISCELLANEOUS) ×1
KIT PINK PAD W/HEAD ARM REST (MISCELLANEOUS) ×1 IMPLANT
LABEL OR SOLS (LABEL) IMPLANT
MANIFOLD NEPTUNE II (INSTRUMENTS) ×1 IMPLANT
NDL HYPO 22X1.5 SAFETY MO (MISCELLANEOUS) ×1 IMPLANT
NDL INSUFFLATION 14GA 120MM (NEEDLE) ×1 IMPLANT
NEEDLE HYPO 22X1.5 SAFETY MO (MISCELLANEOUS) ×1
NEEDLE INSUFFLATION 14GA 120MM (NEEDLE) ×1
OBTURATOR OPTICAL STND 8 DVNC (TROCAR) ×1
OBTURATOR OPTICALSTD 8 DVNC (TROCAR) ×1 IMPLANT
PACK LAP CHOLECYSTECTOMY (MISCELLANEOUS) ×1 IMPLANT
RELOAD STAPLE 45 3.5 BLU DVNC (STAPLE) IMPLANT
SEAL UNIV 5-12 XI (MISCELLANEOUS) ×3 IMPLANT
SEALER VESSEL EXT DVNC XI (MISCELLANEOUS) ×1 IMPLANT
SET TUBE FILTERED XL AIRSEAL (SET/KITS/TRAYS/PACK) IMPLANT
SET TUBE SMOKE EVAC HIGH FLOW (TUBING) ×1 IMPLANT
SOL ELECTROSURG ANTI STICK (MISCELLANEOUS) ×1
SOLUTION ELECTROSURG ANTI STCK (MISCELLANEOUS) ×1 IMPLANT
STAPLER 45 SUREFORM DVNC (STAPLE) IMPLANT
STAPLER RELOAD 3.5X45 BLU DVNC (STAPLE) ×1
SUT MNCRL AB 4-0 PS2 18 (SUTURE) ×1 IMPLANT
SUT VIC AB 3-0 SH 27X BRD (SUTURE) ×1 IMPLANT
SUT VIC AB 4-0 PS2 18 (SUTURE) IMPLANT
SUT VICRYL 0 UR6 27IN ABS (SUTURE) ×2 IMPLANT
SYS BAG RETRIEVAL 10MM (BASKET) ×1
SYSTEM BAG RETRIEVAL 10MM (BASKET) ×1 IMPLANT
TRAY FOLEY MTR SLVR 16FR STAT (SET/KITS/TRAYS/PACK) ×1 IMPLANT
WATER STERILE IRR 500ML POUR (IV SOLUTION) ×1 IMPLANT

## 2023-08-16 NOTE — Anesthesia Postprocedure Evaluation (Signed)
 Anesthesia Post Note  Patient: Lorraine Yates  Procedure(s) Performed: XI ROBOTIC LAPAROSCOPIC ASSISTED APPENDECTOMY  Patient location during evaluation: PACU Anesthesia Type: General Level of consciousness: awake and alert Pain management: pain level controlled Vital Signs Assessment: post-procedure vital signs reviewed and stable Respiratory status: spontaneous breathing, nonlabored ventilation, respiratory function stable and patient connected to nasal cannula oxygen Cardiovascular status: blood pressure returned to baseline and stable Postop Assessment: no apparent nausea or vomiting Anesthetic complications: no   No notable events documented.   Last Vitals:  Vitals:   08/16/23 1015 08/16/23 1030  BP: 115/75 118/83  Pulse: 63 82  Resp: 10 14  Temp:  36.8 C  SpO2: 98% 100%    Last Pain:  Vitals:   08/16/23 1030  PainSc: 5                  Rome Ade

## 2023-08-16 NOTE — Discharge Instructions (Signed)

## 2023-08-16 NOTE — Op Note (Signed)
 Procedure Date:  08/16/2023  Pre-operative Diagnosis:  Acute appendicitis with abscess  Post-operative Diagnosis: Acute appendicitis with abscess  Procedure:  Robotic assisted appendectomy  Surgeon:  Aloysius Sheree Plant, MD  Anesthesia:  General endotracheal  Estimated Blood Loss:  15 ml  Specimens:  appendix  Complications:  None  Indications for Procedure:  This is a 53 y.o. female who presents with recent history of acute appendicitis with abscess, s/p percutaneous drainage.  She now presents for interval appendectomy. The risks of bleeding, infection, recurrence of symptoms, negative laparoscopy, potential for an open procedure, bowel injury, abscess or infection, were all discussed with the patient and she was willing to proceed.  Description of Procedure: The patient was correctly identified in the preoperative area and brought into the operating room.  The patient was placed supine with VTE prophylaxis in place.  Appropriate time-outs were performed.  Anesthesia was induced and the patient was intubated.  Foley catheter was placed.  Appropriate antibiotics were infused.  The abdomen was prepped and draped in a sterile fashion.  A Veress needle was introduced in the left upper quadrant and pneumoperitoneum was obtained with appropriate pressures.  Using Optiview technique, an 8 mm port was introduced in the left lateral abdominal wall without complications.  Then, a 12 mm port was introduced in the left upper quadrant and an 8 mm port in the left lower quadrant under direct visualization.  The DaVinci platform was docked, camera targeted, and instruments placed under direct visualization.  The right abdomen was inspected.  There were significant thin adhesions of the liver to the anterior abdominal wall.  The appendix was retrocecal.  Adhesions to the liver were taken down using Vessel Sealer.  The cecum and right colon were pulled towards the patient's left side exposing the appendix.   Adhesions of the appendix to the right lateral abdominal wall were taken down using Vessel Sealer, and mobilization of the appendix all the way to the edge of the liver was completed.   The base of the appendix was dissected out and divided with a blue load 45 mm stapler.  The mesoappendix was divided using the Vessel Sealer.    The appendix was placed in an Endocatch bag.  The right abdomen was then inspected again revealing an intact staple line, no bleeding, and no bowel injury.  The area was thoroughly irrigated.  The DaVinci platform was then undocked and instruments removed.    40 ml of Exparel  solution mixed with 0.5% bupivacaine  with epi was infiltrated around the port sites.  The 12 mm port was removed and the bag retrieved.  The fascia was closed under direct visualization utilizing an Endo Close technique with 0 Vicryl suture.  The 8 mm ports were removed. The 12 mm incision was closed using 3-0 Vicryl and 4-0 Monocryl, and the other port incisions were closed with 4-0 Monocryl.  The wounds were cleaned and sealed with DermaBond.  Foley catheter was removed and the patient was emerged from anesthesia and extubated and brought to the recovery room for further management.  The patient tolerated the procedure well and all counts were correct at the end of the case.   Aloysius Sheree Plant, MD

## 2023-08-16 NOTE — Anesthesia Preprocedure Evaluation (Signed)
Anesthesia Evaluation  Patient identified by MRN, date of birth, ID band Patient awake    Reviewed: Allergy & Precautions, NPO status , Patient's Chart, lab work & pertinent test results  History of Anesthesia Complications Negative for: history of anesthetic complications  Airway Mallampati: III  TM Distance: >3 FB Neck ROM: Full    Dental no notable dental hx. (+) Teeth Intact   Pulmonary neg sleep apnea, neg COPD, Current Smoker and Patient abstained from smoking.   Pulmonary exam normal breath sounds clear to auscultation       Cardiovascular Exercise Tolerance: Good METS(-) hypertension(-) CAD and (-) Past MI negative cardio ROS (-) dysrhythmias  Rhythm:Regular Rate:Normal - Systolic murmurs    Neuro/Psych negative neurological ROS     GI/Hepatic ,neg GERD  ,,(+)     (-) substance abuse    Endo/Other  neg diabetes    Renal/GU negative Renal ROS     Musculoskeletal   Abdominal   Peds  Hematology   Anesthesia Other Findings Past Medical History: No date: Acquired pancytopenia (HCC) No date: Anemia No date: Blood transfusion without reported diagnosis No date: History of cancer chemotherapy     Comment:  cervical cancer  01-20-2023  to 02-28-2023 No date: History of radiation therapy     Comment:  Cervix- 01/20/23-04/07/23-Dr. 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[facility])     Comment:  oncologist--- dr gorsuch/  radiation oncologist--- dr               Lorraine Yates;  dx 05/ 2024;   chemo 01-20-2023 to 02-28-2023;                IMRT 01-20-2023 to 03-03-2023  to start high dose               radiation 03-10-2023 w/ tandem 12/16/2022: Port-A-Cath in place  Reproductive/Obstetrics                             Anesthesia Physical Anesthesia Plan  ASA: 2  Anesthesia Plan: General   Post-op Pain Management: Gabapentin PO (pre-op)*, Tylenol PO  (pre-op)* and Toradol IV (intra-op)*   Induction: Intravenous  PONV Risk Score and Plan: 3 and Ondansetron, Dexamethasone and Midazolam  Airway Management Planned: Oral ETT and Video Laryngoscope Planned  Additional Equipment: None  Intra-op Plan:   Post-operative Plan: Extubation in OR  Informed Consent: I have reviewed the patients History and Physical, chart, labs and discussed the procedure including the risks, benefits and alternatives for the proposed anesthesia with the patient or authorized representative who has indicated his/her understanding and acceptance.     Dental advisory given  Plan Discussed with: CRNA and Surgeon  Anesthesia Plan Comments: (Discussed risks of anesthesia with patient, including PONV, sore throat, lip/dental/eye damage. Rare risks discussed as well, such as cardiorespiratory and neurological sequelae, and allergic reactions. Discussed the role of CRNA in patient's perioperative care. Patient understands. Patient counseled on benefits of smoking cessation, and increased perioperative risks associated with continued smoking. )       Anesthesia Quick Evaluation

## 2023-08-16 NOTE — Transfer of Care (Signed)
 Immediate Anesthesia Transfer of Care Note  Patient: Lorraine Yates  Procedure(s) Performed: XI ROBOTIC LAPAROSCOPIC ASSISTED APPENDECTOMY  Patient Location: PACU  Anesthesia Type:General  Level of Consciousness: awake and patient cooperative  Airway & Oxygen Therapy: Patient Spontanous Breathing and Patient connected to face mask oxygen  Post-op Assessment: Report given to RN and Post -op Vital signs reviewed and stable  Post vital signs: Reviewed and stable  Last Vitals:  Vitals Value Taken Time  BP 96/79 08/16/23 0941  Temp 97.6   Pulse 90 08/16/23 0943  Resp 17 08/16/23 0943  SpO2 100 % 08/16/23 0943  Vitals shown include unfiled device data.  Last Pain:  Vitals:   08/16/23 0625  PainSc: 0-No pain      Patients Stated Pain Goal: 0 (08/16/23 9374)  Complications: No notable events documented.

## 2023-08-16 NOTE — Interval H&P Note (Signed)
 History and Physical Interval Note:  08/16/2023 7:11 AM  Lorraine Yates  has presented today for surgery, with the diagnosis of acute appendicitis with abscess.  The various methods of treatment have been discussed with the patient and family. After consideration of risks, benefits and other options for treatment, the patient has consented to  Procedure(s): XI ROBOTIC LAPAROSCOPIC ASSISTED APPENDECTOMY (N/A) as a surgical intervention.  The patient's history has been reviewed, patient examined, no change in status, stable for surgery.  I have reviewed the patient's chart and labs.  Questions were answered to the patient's satisfaction.     [name]

## 2023-08-16 NOTE — Anesthesia Procedure Notes (Signed)
 Procedure Name: Intubation Date/Time: 08/16/2023 7:39 AM  Performed by: Nada Corean CROME, CRNAPre-anesthesia Checklist: Patient identified, Emergency Drugs available, Suction available, Timeout performed and Patient being monitored Patient Re-evaluated:Patient Re-evaluated prior to induction Oxygen Delivery Method: Circle system utilized Preoxygenation: Pre-oxygenation with 100% oxygen Induction Type: IV induction Ventilation: Mask ventilation without difficulty Grade View: Grade I Tube type: Oral Tube size: 6.0 mm Number of attempts: 1 Airway Equipment and Method: Stylet and Video-laryngoscopy Placement Confirmation: ETT inserted through vocal cords under direct vision, positive ETCO2 and breath sounds checked- equal and bilateral Secured at: 20 cm Tube secured with: Tape Dental Injury: Teeth and Oropharynx as per pre-operative assessment

## 2023-08-17 ENCOUNTER — Encounter: Payer: Self-pay | Admitting: Surgery

## 2023-08-18 LAB — SURGICAL PATHOLOGY

## 2023-08-19 ENCOUNTER — Encounter: Payer: Self-pay | Admitting: Hematology and Oncology

## 2023-08-30 ENCOUNTER — Ambulatory Visit (INDEPENDENT_AMBULATORY_CARE_PROVIDER_SITE_OTHER): Payer: BC Managed Care – PPO | Admitting: Physician Assistant

## 2023-08-30 ENCOUNTER — Encounter: Payer: Self-pay | Admitting: Physician Assistant

## 2023-08-30 VITALS — BP 131/85 | HR 79 | Temp 98.9°F | Ht 63.0 in | Wt 156.2 lb

## 2023-08-30 DIAGNOSIS — Z09 Encounter for follow-up examination after completed treatment for conditions other than malignant neoplasm: Secondary | ICD-10-CM

## 2023-08-30 DIAGNOSIS — K35211 Acute appendicitis with generalized peritonitis, with perforation and abscess: Secondary | ICD-10-CM

## 2023-08-30 NOTE — Progress Notes (Signed)
 [facility] POST-OP OFFICE VISIT  08/30/2023  HPI: Lorraine Yates is a 53 y.o. female 14 days s/p robotic appendectomy for history of acute appendicitis with abscess with Dr Aleen Campi   She has done well Some incisional soreness; improving No fever, chills, nausea, emesis Tolerating PO; normal bowel function Incisions are healing well No other complaints   Vital signs: BP 131/85   Pulse 79   Temp 98.9 F (37.2 C) (Oral)   Ht 5\' 3"  (1.6 m)   Wt 156 lb 3.2 oz (70.9 kg)   SpO2 99%   BMI 27.67 kg/m    Physical Exam: Constitutional: Well appearing female, NAD Abdomen: Soft, non-tender, non-distended, no rebound/guarding Skin: Laparoscopic incisions are healing well, no erythema or drainage   Assessment/Plan: This is a 53 y.o. female 14 days s/p robotic appendectomy for history of acute appendicitis with abscess with Dr Aleen Campi    - Pain control prn  - Reviewed wound care recommendation  - Reviewed lifting restrictions; 4 weeks total  - Reviewed surgical pathology; Appendicitis   - She can follow up on as needed basis; She understands to call with questions/concerns  -- Lynden Oxford, PA-C [facility] 08/30/2023, 3:52 PM M-F: 7am - 4pm

## 2023-08-30 NOTE — Patient Instructions (Signed)

## 2023-08-31 ENCOUNTER — Encounter: Payer: BC Managed Care – PPO | Admitting: Physician Assistant

## 2023-09-13 ENCOUNTER — Encounter: Payer: Self-pay | Admitting: Gastroenterology

## 2023-09-13 ENCOUNTER — Ambulatory Visit (INDEPENDENT_AMBULATORY_CARE_PROVIDER_SITE_OTHER): Payer: BC Managed Care – PPO | Admitting: Gastroenterology

## 2023-09-13 VITALS — BP 130/80 | HR 80 | Ht 63.0 in | Wt 158.0 lb

## 2023-09-13 DIAGNOSIS — K642 Third degree hemorrhoids: Secondary | ICD-10-CM | POA: Diagnosis not present

## 2023-09-13 MED ORDER — FIBER GUMMIES 2 G PO CHEW: 1.0000 | CHEWABLE_TABLET | Freq: Every day | ORAL | Status: AC

## 2023-09-13 NOTE — Patient Instructions (Addendum)
 HEMORRHOID BANDING PROCEDURE    FOLLOW-UP CARE   The procedure you have had should have been relatively painless since the banding of the area involved does not have nerve endings and there is no pain sensation.  The rubber band cuts off the blood supply to the hemorrhoid and the band may fall off as soon as 48 hours after the banding (the band may occasionally be seen in the toilet bowl following a bowel movement). You may notice a temporary feeling of fullness in the rectum which should respond adequately to plain Tylenol or Motrin.  Following the banding, avoid strenuous exercise that evening and resume full activity the next day.  A sitz bath (soaking in a warm tub) or bidet is soothing, and can be useful for cleansing the area after bowel movements.     To avoid constipation, take two tablespoons of natural wheat bran, natural oat bran, flax, Benefiber or any over the counter fiber supplement and increase your water intake to 7-8 glasses daily.    Unless you have been prescribed anorectal medication, do not put anything inside your rectum for two weeks: No suppositories, enemas, fingers, etc.  Occasionally, you may have more bleeding than usual after the banding procedure.  This is often from the untreated hemorrhoids rather than the treated one.  Don't be concerned if there is a tablespoon or so of blood.  If there is more blood than this, lie flat with your bottom higher than your head and apply an ice pack to the area. If the bleeding does not stop within a half an hour or if you feel faint, call our office at [phone] or go to the emergency room.  Problems are not common; however, if there is a substantial amount of bleeding, severe pain, chills, fever or difficulty passing urine (very rare) or other problems, you should call us at [phone] or report to the nearest emergency room.  Do not stay seated continuously for more than 2-3 hours for a day or two after the procedure.   Tighten your buttock muscles 10-15 times every two hours and take 10-15 deep breaths every 1-2 hours.  Do not spend more than a few minutes on the toilet if you cannot empty your bowel; instead re-visit the toilet at a later time.   Take a daily fiber supplement.  You have been scheduled for a follow up appointment on Friday, 11-25-23 at 11:00 am. Please arrive 10 minutes early for registration. If you need to reschedule or cancel this appointment please call [phone] as soon as possible. Thank you.  Thank you for entrusting me with your care and for choosing [facility], Dr. Ileene Patrick

## 2023-09-13 NOTE — Progress Notes (Signed)
 53 year old female here for hemorrhoid banding.  Colonoscopy performed in December showing grade 3 hemorrhoids.  She has had problems with prolapse and bleeding for some time.  Denies any hard stools but does strain when using the bathroom at times.  Symptoms have been longstanding.  Recall she had a perforated appendix back in October, treated conservatively with antibiotics and drainage.  Status post colonoscopy in December which showed no concerning pathology.  She underwent appendectomy last month and is recovering from that, states she has some pain at the surgical site but doing better.  Recall she also has a history of cervical cancer status post XRT last year and currently on Keytruda infusions.  She has had some issues with anemia in the past but her last lab in January showed normal platelets and only mild decrease in hemoglobin.  She had no radiation changes in her colon on her colonoscopy.  Given her symptoms she wanted to proceed with intervention on her hemorrhoids.  I have discussed risks and benefits of hemorrhoid banding as well as alternative treatments.  She wishes to proceed with hemorrhoid banding today.  Colonoscopy 06/21/23: - Hemorrhoids were found on perianal exam (grade III - reduced on exam). - The terminal ileum appeared normal. - Many diverticula were found in the entire colon. - Internal hemorrhoids were found during retroflexion. - The exam was otherwise without abnormality. The appendiceal orifice is normal.    Appendectomy 08/17/23   PROCEDURE NOTE: The patient presents with symptomatic grade III  hemorrhoids, requesting rubber band ligation of his/her hemorrhoidal disease.  All risks, benefits and alternative forms of therapy were described and informed consent was obtained.   The anorectum was pre-medicated with 0.125% nitroglycerin The decision was made to band the LL internal hemorrhoid, and the [facility] System was used to perform band ligation without  complication.  Digital anorectal examination was then performed to assure proper positioning of the band, and to adjust the banded tissue as required.  The patient was discharged home without pain or other issues.  Dietary and behavioral recommendations were given and along with follow-up instructions.     The following adjunctive treatments were recommended: Fiber supplement daily  The patient will return in 2-4 weeks for  follow-up and possible additional banding as required. No complications were encountered and the patient tolerated the procedure well.  Harlin Rain, MD [facility] Gastroenterology

## 2023-09-15 ENCOUNTER — Inpatient Hospital Stay (HOSPITAL_BASED_OUTPATIENT_CLINIC_OR_DEPARTMENT_OTHER): Payer: BC Managed Care – PPO | Admitting: Hematology and Oncology

## 2023-09-15 ENCOUNTER — Inpatient Hospital Stay: Payer: BC Managed Care – PPO | Attending: Hematology and Oncology

## 2023-09-15 ENCOUNTER — Inpatient Hospital Stay: Payer: BC Managed Care – PPO

## 2023-09-15 VITALS — BP 139/85 | HR 80 | Temp 97.6°F | Resp 18 | Ht 63.0 in | Wt 157.0 lb

## 2023-09-15 DIAGNOSIS — Z923 Personal history of irradiation: Secondary | ICD-10-CM | POA: Insufficient documentation

## 2023-09-15 DIAGNOSIS — Z5112 Encounter for antineoplastic immunotherapy: Secondary | ICD-10-CM | POA: Insufficient documentation

## 2023-09-15 DIAGNOSIS — Z720 Tobacco use: Secondary | ICD-10-CM

## 2023-09-15 DIAGNOSIS — Z79620 Long term (current) use of immunosuppressive biologic: Secondary | ICD-10-CM | POA: Insufficient documentation

## 2023-09-15 DIAGNOSIS — D61818 Other pancytopenia: Secondary | ICD-10-CM | POA: Diagnosis not present

## 2023-09-15 DIAGNOSIS — D508 Other iron deficiency anemias: Secondary | ICD-10-CM

## 2023-09-15 DIAGNOSIS — F17210 Nicotine dependence, cigarettes, uncomplicated: Secondary | ICD-10-CM | POA: Insufficient documentation

## 2023-09-15 DIAGNOSIS — C531 Malignant neoplasm of exocervix: Secondary | ICD-10-CM

## 2023-09-15 DIAGNOSIS — N939 Abnormal uterine and vaginal bleeding, unspecified: Secondary | ICD-10-CM

## 2023-09-15 LAB — CMP (CANCER CENTER ONLY)
ALT: 16 U/L (ref 0–44)
AST: 15 U/L (ref 15–41)
Albumin: 4.4 g/dL (ref 3.5–5.0)
Alkaline Phosphatase: 98 U/L (ref 38–126)
Anion gap: 5 (ref 5–15)
BUN: 17 mg/dL (ref 6–20)
CO2: 29 mmol/L (ref 22–32)
Calcium: 9.4 mg/dL (ref 8.9–10.3)
Chloride: 106 mmol/L (ref 98–111)
Creatinine: 0.86 mg/dL (ref 0.44–1.00)
GFR, Estimated: >60 mL/min (ref >60)
Glucose, Bld: 119 mg/dL — ABNORMAL HIGH (ref 70–99)
Potassium: 3.8 mmol/L (ref 3.5–5.1)
Sodium: 140 mmol/L (ref 135–145)
Total Bilirubin: 0.4 mg/dL (ref 0.0–1.2)
Total Protein: 7.5 g/dL (ref 6.5–8.1)

## 2023-09-15 LAB — CBC WITH DIFFERENTIAL (CANCER CENTER ONLY)
Abs Immature Granulocytes: 0.01 10*3/uL (ref 0.00–0.07)
Basophils Absolute: 0 10*3/uL (ref 0.0–0.1)
Basophils Relative: 1 %
Eosinophils Absolute: 0.1 10*3/uL (ref 0.0–0.5)
Eosinophils Relative: 2 %
HCT: 34.9 % — ABNORMAL LOW (ref 36.0–46.0)
Hemoglobin: 11.8 g/dL — ABNORMAL LOW (ref 12.0–15.0)
Immature Granulocytes: 0 %
Lymphocytes Relative: 22 %
Lymphs Abs: 0.7 10*3/uL (ref 0.7–4.0)
MCH: 32.8 pg (ref 26.0–34.0)
MCHC: 33.8 g/dL (ref 30.0–36.0)
MCV: 96.9 fL (ref 80.0–100.0)
Monocytes Absolute: 0.4 10*3/uL (ref 0.1–1.0)
Monocytes Relative: 12 %
Neutro Abs: 2 10*3/uL (ref 1.7–7.7)
Neutrophils Relative %: 63 %
Platelet Count: 248 10*3/uL (ref 150–400)
RBC: 3.6 MIL/uL — ABNORMAL LOW (ref 3.87–5.11)
RDW: 13.7 % (ref 11.5–15.5)
WBC Count: 3.2 10*3/uL — ABNORMAL LOW (ref 4.0–10.5)
nRBC: 0 % (ref 0.0–0.2)

## 2023-09-15 LAB — TSH: TSH: 1.432 u[IU]/mL (ref 0.350–4.500)

## 2023-09-15 MED ORDER — SODIUM CHLORIDE 0.9% FLUSH
10.0000 mL | INTRAVENOUS | Status: DC | PRN
  Administered 2023-09-15: 10 mL

## 2023-09-15 MED ORDER — SODIUM CHLORIDE 0.9% FLUSH
10.0000 mL | Freq: Once | INTRAVENOUS | Status: AC
Start: 2023-09-15 — End: 2023-09-15
  Administered 2023-09-15: 10 mL

## 2023-09-15 MED ORDER — SODIUM CHLORIDE 0.9 % IV SOLN: Freq: Once | INTRAVENOUS | Status: AC

## 2023-09-15 MED ORDER — HEPARIN SOD (PORK) LOCK FLUSH 100 UNIT/ML IV SOLN
500.0000 [IU] | Freq: Once | INTRAVENOUS | Status: AC | PRN
  Administered 2023-09-15: 500 [IU]

## 2023-09-15 MED ORDER — SODIUM CHLORIDE 0.9 % IV SOLN
400.0000 mg | Freq: Once | INTRAVENOUS | Status: AC
  Administered 2023-09-15: 400 mg via INTRAVENOUS
  Filled 2023-09-15: qty 16

## 2023-09-15 NOTE — Patient Instructions (Signed)

## 2023-09-16 ENCOUNTER — Encounter: Payer: Self-pay | Admitting: Hematology and Oncology

## 2023-09-16 LAB — T4: T4, Total: 7.4 ug/dL (ref 4.5–12.0)

## 2023-09-16 NOTE — Assessment & Plan Note (Addendum)
 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, molecular testing show PD-L1 with CPS score of 1% Imaging study showed pelvic lymphadenopathy She had excellent response with concurrent chemoradiation therapy with cisplatin and pembrolizumab Radiation treatment completed by September 2024 and she continues on maintenance pembrolizumab  Her treatment was interrupted due to diagnosis of ruptured appendix, manage conservatively Since last time I saw her, she has successful appendectomy with uneventful healing She denies side effects of pembrolizumab She is attempting to quit smoking We will resume treatment today Plan to order imaging study for objective assessment of response to treatment

## 2023-09-16 NOTE — Assessment & Plan Note (Addendum)
 This is due to recent treatment  She is not symptomatic Will proceed without delay Observe closely for now

## 2023-09-16 NOTE — Assessment & Plan Note (Addendum)
 She is attempting to quit smoking We discussed importance of nicotine cessation

## 2023-09-16 NOTE — Progress Notes (Signed)
 [facility] OFFICE PROGRESS NOTE  Patient Care Team: Pcp, No as PCP - General Artis Delay, MD as Consulting Physician (Hematology and Oncology) Carver Fila, MD as Consulting Physician (Gynecologic Oncology) Karie Soda, MD as Consulting Physician (General Surgery) Armbruster, Willaim Rayas, MD as Consulting Physician (Gastroenterology) Antony Blackbird, MD as Consulting Physician (Radiation Oncology)  Assessment & Plan Malignant neoplasm of exocervix [facility]) 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, molecular testing show PD-L1 with CPS score of 1% Imaging study showed pelvic lymphadenopathy She had excellent response with concurrent chemoradiation therapy with cisplatin and pembrolizumab Radiation treatment completed by September 2024 and she continues on maintenance pembrolizumab  Her treatment was interrupted due to diagnosis of ruptured appendix, manage conservatively Since last time I saw her, she has successful appendectomy with uneventful healing She denies side effects of pembrolizumab She is attempting to quit smoking We will resume treatment today Plan to order imaging study for objective assessment of response to treatment Tobacco abuse She is attempting to quit smoking We discussed importance of nicotine cessation Pancytopenia, acquired (HCC) This is due to recent treatment  She is not symptomatic Will proceed without delay Observe closely for now  Orders Placed This Encounter  Procedures   CT ABDOMEN PELVIS W CONTRAST    Standing Status:   Future    Expected Date:   09/29/2023    Expiration Date:   09/14/2024    If indicated for the ordered procedure, I authorize the administration of contrast media per Radiology protocol:   Yes    Does the patient have a contrast media/X-ray dye allergy?:   No     Is patient pregnant?:   No    Preferred imaging location?:   [facility]    If indicated for the ordered procedure, I authorize the administration of oral contrast media per Radiology protocol:   Yes     Artis Delay, MD  INTERVAL HISTORY: she returns for treatment follow-up Complications related to previous cycle of chemotherapy included pancytopenia, and recent hospitalization/ ER visit, She denies recent infection, fever or chills  PHYSICAL EXAMINATION: ECOG PERFORMANCE STATUS: 1 - Symptomatic but completely ambulatory  Vitals:   09/15/23 1259  BP: 139/85  Pulse: 80  Resp: 18  Temp: 97.6 F (36.4 C)  SpO2: 100%   Filed Weights   09/15/23 1259  Weight: 157 lb (71.2 kg)    Relevant data reviewed during this visit included CBC, CMP, recent surgical report and pathology report

## 2023-09-26 ENCOUNTER — Ambulatory Visit (HOSPITAL_COMMUNITY)
Admission: RE | Admit: 2023-09-26 | Discharge: 2023-09-26 | Disposition: A | Discharge status: Home / Self Care | Source: Ambulatory Visit | Attending: Hematology and Oncology | Admitting: Hematology and Oncology

## 2023-09-26 ENCOUNTER — Encounter (HOSPITAL_COMMUNITY): Payer: Self-pay

## 2023-09-26 DIAGNOSIS — C531 Malignant neoplasm of exocervix: Secondary | ICD-10-CM | POA: Insufficient documentation

## 2023-09-26 MED ORDER — IOHEXOL 9 MG/ML PO SOLN
ORAL | Status: AC
  Filled 2023-09-26: qty 1000

## 2023-09-26 MED ORDER — IOHEXOL 9 MG/ML PO SOLN
1000.0000 mL | ORAL | Status: AC
Start: 2023-09-26 — End: 2023-09-26
  Administered 2023-09-26: 1000 mL via ORAL

## 2023-09-26 MED ORDER — IOHEXOL 300 MG/ML  SOLN
100.0000 mL | Freq: Once | INTRAMUSCULAR | Status: AC | PRN
  Administered 2023-09-26: 100 mL via INTRAVENOUS

## 2023-10-05 ENCOUNTER — Telehealth: Payer: Self-pay

## 2023-10-05 NOTE — Telephone Encounter (Signed)
 Placed call to [facility] reading room for STAT read on CT abd. Advised it would be available "soon"

## 2023-10-06 ENCOUNTER — Inpatient Hospital Stay (HOSPITAL_BASED_OUTPATIENT_CLINIC_OR_DEPARTMENT_OTHER): Admitting: Hematology and Oncology

## 2023-10-06 VITALS — BP 131/76 | HR 79 | Temp 97.8°F | Resp 18 | Ht 63.0 in | Wt 164.2 lb

## 2023-10-06 DIAGNOSIS — Z720 Tobacco use: Secondary | ICD-10-CM

## 2023-10-06 DIAGNOSIS — C538 Malignant neoplasm of overlapping sites of cervix uteri: Secondary | ICD-10-CM

## 2023-10-06 DIAGNOSIS — C531 Malignant neoplasm of exocervix: Secondary | ICD-10-CM | POA: Diagnosis not present

## 2023-10-06 NOTE — Progress Notes (Signed)
 [facility] OFFICE PROGRESS NOTE  Patient Care Team: Pcp, No as PCP - General Artis Delay, MD as Consulting Physician (Hematology and Oncology) Carver Fila, MD as Consulting Physician (Gynecologic Oncology) Karie Soda, MD as Consulting Physician (General Surgery) Armbruster, Willaim Rayas, MD as Consulting Physician (Gastroenterology) Antony Blackbird, MD as Consulting Physician (Radiation Oncology)  Assessment & Plan Malignant neoplasm of overlapping sites of cervix [facility]) 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, molecular testing show PD-L1 with CPS score of 1% Imaging study showed pelvic lymphadenopathy She had excellent response with concurrent chemoradiation therapy with cisplatin and pembrolizumab Radiation treatment completed by September 2024 and she continues on maintenance pembrolizumab  I reviewed imaging study with her Unfortunately, formal report is not yet available Overall, she has excellent response to therapy We will continue maintenance pembrolizumab She has appointment next week to follow-up with radiation oncologist and appointment with GYN surgeon in July We will continue close monitoring with surveillance imaging study in a few months I will see her next month for treatment Tobacco abuse According to the patient, she has quit smoking I encouraged the patient to stay abstinent  No orders of the defined types were placed in this encounter.    Artis Delay, MD  INTERVAL HISTORY: she returns for surveillance followup and review of her imaging studies She has not smoked cigarettes recently Denies abdominal pain no vaginal discharge  PHYSICAL EXAMINATION: ECOG PERFORMANCE STATUS: 0 - Asymptomatic  Vitals:   10/06/23 1200  BP: 131/76  Pulse: 79  Resp: 18  Temp: 97.8 F (36.6 C)   SpO2: 100%   Filed Weights   10/06/23 1200  Weight: 164 lb 3.2 oz (74.5 kg)    Relevant data reviewed during this visit included CT imaging from March 2025

## 2023-10-06 NOTE — Assessment & Plan Note (Addendum)
 According to the patient, she has quit smoking I encouraged the patient to stay abstinent

## 2023-10-06 NOTE — Assessment & Plan Note (Addendum)
 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, molecular testing show PD-L1 with CPS score of 1% Imaging study showed pelvic lymphadenopathy She had excellent response with concurrent chemoradiation therapy with cisplatin and pembrolizumab Radiation treatment completed by September 2024 and she continues on maintenance pembrolizumab  I reviewed imaging study with her Unfortunately, formal report is not yet available Overall, she has excellent response to therapy We will continue maintenance pembrolizumab She has appointment next week to follow-up with radiation oncologist and appointment with GYN surgeon in July We will continue close monitoring with surveillance imaging study in a few months I will see her next month for treatment

## 2023-10-07 ENCOUNTER — Telehealth: Payer: Self-pay

## 2023-10-07 NOTE — Progress Notes (Signed)
 Lorraine Yates is here today for follow up post radiation to the pelvic.  They completed their radiation on: 04/07/23   Does the patient complain of any of the following:  Pain:Denies Abdominal bloating: Denies Diarrhea/Constipation: Denies Nausea/Vomiting: Denies Vaginal Discharge: Denies Blood in Urine or Stool: Denies Urinary Issues (dysuria/incomplete emptying/ incontinence/ increased frequency/urgency): Denies Does patient report using vaginal dilator 2-3 times a week and/or sexually active 2-3 weeks: Yes, she reports being sexually active . Post radiation skin changes: Denies   Additional comments if applicable: She reports being concerned that her sex drive has become low.  BP (!) 130/95 (BP Location: Left Arm, Patient Position: Sitting, Cuff Size: Normal)   Pulse 83   Temp 97.6 F (36.4 C)   Resp 18   Ht 5\' 3"  (1.6 m)   Wt 160 lb (72.6 kg)   LMP 06/11/2022   SpO2 100%   BMI 28.34 kg/m

## 2023-10-07 NOTE — Telephone Encounter (Signed)
-----   Message from Artis Delay sent at 10/07/2023 12:50 PM EDT ----- Let her know CT report is now available, as discussed everything looks good

## 2023-10-07 NOTE — Telephone Encounter (Signed)
 Called and left below message. Ask her to call the office for questions. ?

## 2023-10-08 NOTE — Progress Notes (Signed)
 Radiation Oncology         [phone] ________________________________  Name: Lorraine Yates MRN: [id number]  Date: 10/10/2023  DOB: March 23, 1971  Follow-Up Visit Note  CC: Pcp, No  Carver Fila, MD  No diagnosis found.  Diagnosis: Stage IIIc, squamous cell carcinoma of the cervix    Cancer Staging  Cervical cancer (HCC) Staging form: Cervix Uteri, AJCC Version 9 - Clinical stage from 12/02/2022: Stage IIIC1 (cT1b3, cN1, cM0) - Signed by Artis Delay, MD on 12/15/2022  Interval Since Last Radiation: 6 months and 2 days   Indication for treatment: Curative         Radiation treatment dates: 01/20/23 through 04/07/23 ( Pelvic IMRT : 01/20/23 through 03/03/23)  ( Brachytherapy :5 treatment sessions delivered from 03/10/23 through 04/07/23)   Site/dose:   1) Pelvic IMRT - 45 Gy delivered in 25 Fx at 1.8 Gy/Fx  2) Pelvic boost - 9 Gy delivered in 5 Fx at 1.8 Gy/Fx 3) Cervical brachytherapy - 27.5 Gy delivered in 5 Fx at 5.5 Gy/Fx Technique/Mode:  1) IMRT / Photon  2) 3D / Photon  3) HDR Ir-192 / Brachytherapy  Narrative:  The patient returns today for routine follow-up. She was last seen here for follow-up on 05/12/23.   Since her last visit, she has continued to receive maintenance immunotherapy under Dr. Bertis Ruddy which she continues to tolerate well overall. She was also noted to be doing well and NED on examination during her most recent follow up visit with Dr. Pricilla Holm on 07/15/23.   To review, her maintenance therapy was briefly interrupted due to a ruptured appendix which she was hospitalized for shortly before our last visit (required cath drainage of hepatic/perihepatic abscess and drain placement on 04/26/24). In this setting, she was referred to general surgery and recently underwent an appendectomy on 08/16/23. Her post-operative course was uncomplicated and she continues to heal well from this.         Pertinent imaging performed in the interval since her last visit  includes a CT AP with contrast on 08/29/23 which demonstrated a positive response to therapy with a stable, possibly treatment related, nonspecific endometrial enhancement demonstrated and no evidence of metastatic disease. Mild nonspecific perirectal soft tissue stranding was also demonstrated and is also possibly treatment related. CT findings also included normal postoperative change (s/p appendectomy) with a small amount of ascites along the inferior margin of liver and in the right lower quadrant also present.     ***  Allergies:  is allergic to demerol [meperidine hcl] and hydromorphone.  Meds: Current Outpatient Medications  Medication Sig Dispense Refill   acetaminophen (TYLENOL) 500 MG tablet Take 2 tablets (1,000 mg total) by mouth every 6 (six) hours as needed for mild pain (pain score 1-3).     Fiber Gummies 2 g CHEW Chew 1 each by mouth daily.     ibuprofen (ADVIL) 600 MG tablet Take 1 tablet (600 mg total) by mouth every 8 (eight) hours as needed for moderate pain (pain score 4-6). 60 tablet 1   No current facility-administered medications for this encounter.    Physical Findings: The patient is in no acute distress. Patient is alert and oriented.  vitals were not taken for this visit. .  No significant changes. Lungs are clear to auscultation bilaterally. Heart has regular rate and rhythm. No palpable cervical, supraclavicular, or axillary adenopathy. Abdomen soft, non-tender, normal bowel sounds.  On pelvic examination the external genitalia were unremarkable. A speculum exam was performed.  There are no mucosal lesions noted in the vaginal vault. A Pap smear was obtained of the proximal vagina. On bimanual and rectovaginal examination there were no pelvic masses appreciated. ***   Lab Findings: Lab Results  Component Value Date   WBC 3.2 (L) 09/15/2023   HGB 11.8 (L) 09/15/2023   HCT 34.9 (L) 09/15/2023   MCV 96.9 09/15/2023   PLT 248 09/15/2023    Radiographic  Findings: CT ABDOMEN PELVIS W CONTRAST Result Date: 10/07/2023 CLINICAL DATA:  Cervical cancer. Assess treatment response. * Tracking Code: BO * EXAM: CT ABDOMEN AND PELVIS WITH CONTRAST TECHNIQUE: Multidetector CT imaging of the abdomen and pelvis was performed using the standard protocol following bolus administration of intravenous contrast. RADIATION DOSE REDUCTION: This exam was performed according to the departmental dose-optimization program which includes automated exposure control, adjustment of the mA and/or kV according to patient size and/or use of iterative reconstruction technique. CONTRAST:  OMNIPAQUE IOHEXOL 300 MG/ML  SOLN COMPARISON:  Abdominopelvic CT 05/18/2023 and 04/26/2023. PET-CT 12/14/2022. FINDINGS: Lower chest: Clear lung bases. No significant pleural or pericardial effusion. Hepatobiliary: The liver is normal in density without suspicious focal abnormality. No evidence of gallstones, gallbladder wall thickening or biliary dilatation. Pancreas: Unremarkable. No pancreatic ductal dilatation or surrounding inflammatory changes. Spleen: Normal in size without focal abnormality. Adrenals/Urinary Tract: Both adrenal glands appear normal. No evidence of urinary tract calculus, suspicious renal lesion or hydronephrosis. The bladder appears unremarkable for its degree of distention. Stomach/Bowel: Enteric contrast has passed into the distal colon. The stomach appears unremarkable for its degree of distension. No evidence of bowel wall thickening, distention or surrounding inflammatory change. Patient has undergone interval subhepatic drain removal and robotic assisted laparoscopic appendectomy. Minimal residual fluid along the inferior margin of the liver without focal fluid collection. Vascular/Lymphatic: There are no enlarged abdominal or pelvic lymph nodes. No significant vascular findings. Reproductive: The uterus appears unchanged with stable nonspecific endometrial enhancement. No  focal uterine or adnexal mass identified. Other: As above, interval removal of the drain from the subhepatic space. Small amount of ascites along the inferior margin of the liver and in the right lower quadrant. Mild perirectal soft tissue stranding. No focal fluid collection, omental nodularity or pneumoperitoneum identified. Musculoskeletal: No acute or significant osseous findings. Mild lumbar spondylosis. IMPRESSION: 1. Interval removal of the subhepatic drain and robotic assisted laparoscopic appendectomy. Small amount of ascites along the inferior margin of the liver and in the right lower quadrant. No focal fluid collection, omental nodularity or pneumoperitoneum identified. 2. No evidence of metastatic disease. 3. Stable nonspecific endometrial enhancement, possibly treatment related. 4. Mild perirectal soft tissue stranding, nonspecific and possibly treatment related as well. Electronically Signed   By: Carey Bullocks M.D.   On: 10/07/2023 12:46    Impression: Stage IIIc, squamous cell carcinoma of the cervix   The patient is recovering from the effects of radiation.  ***  Plan:  ***   *** minutes of total time was spent for this patient encounter, including preparation, face-to-face counseling with the patient and coordination of care, physical exam, and documentation of the encounter. ____________________________________  Billie Lade, PhD, MD  This document serves as a record of services personally performed by Antony Blackbird, MD. It was created on his behalf by Neena Rhymes, a trained medical scribe. The creation of this record is based on the scribe's personal observations and the provider's statements to them. This document has been checked and approved by the attending provider.

## 2023-10-10 ENCOUNTER — Encounter: Payer: Self-pay | Admitting: Radiation Oncology

## 2023-10-10 ENCOUNTER — Ambulatory Visit
Admission: RE | Admit: 2023-10-10 | Discharge: 2023-10-10 | Disposition: A | Discharge status: Home / Self Care | Payer: Self-pay | Source: Ambulatory Visit | Attending: Radiation Oncology | Admitting: Radiation Oncology

## 2023-10-10 VITALS — BP 130/95 | HR 83 | Temp 97.6°F | Resp 18 | Ht 63.0 in | Wt 160.0 lb

## 2023-10-10 DIAGNOSIS — R6882 Decreased libido: Secondary | ICD-10-CM | POA: Insufficient documentation

## 2023-10-10 DIAGNOSIS — M47816 Spondylosis without myelopathy or radiculopathy, lumbar region: Secondary | ICD-10-CM | POA: Insufficient documentation

## 2023-10-10 DIAGNOSIS — C538 Malignant neoplasm of overlapping sites of cervix uteri: Secondary | ICD-10-CM

## 2023-10-10 DIAGNOSIS — Z9221 Personal history of antineoplastic chemotherapy: Secondary | ICD-10-CM | POA: Diagnosis not present

## 2023-10-10 DIAGNOSIS — Z8541 Personal history of malignant neoplasm of cervix uteri: Secondary | ICD-10-CM | POA: Insufficient documentation

## 2023-10-10 DIAGNOSIS — K649 Unspecified hemorrhoids: Secondary | ICD-10-CM | POA: Diagnosis not present

## 2023-10-10 DIAGNOSIS — Z923 Personal history of irradiation: Secondary | ICD-10-CM | POA: Diagnosis not present

## 2023-10-10 NOTE — Addendum Note (Signed)
 Encounter addended by: Rana Snare, LPN on: 2/84/1324 1:49 PM  Actions taken: Charge Capture section accepted

## 2023-10-11 ENCOUNTER — Other Ambulatory Visit: Payer: Self-pay

## 2023-10-12 ENCOUNTER — Encounter: Payer: Self-pay | Admitting: Obstetrics & Gynecology

## 2023-10-12 ENCOUNTER — Telehealth: Payer: Self-pay

## 2023-10-12 NOTE — Telephone Encounter (Signed)
 Per Warner Mccreedy, patient is scheduled to see Dr.Jackson-Moore on 4/8 to discuss decrease libido. Referral from Bryan Lemma PA

## 2023-10-18 ENCOUNTER — Inpatient Hospital Stay: Attending: Hematology and Oncology | Admitting: Obstetrics & Gynecology

## 2023-10-18 VITALS — BP 114/89 | HR 79 | Temp 98.2°F | Resp 20 | Wt 158.3 lb

## 2023-10-18 DIAGNOSIS — R6882 Decreased libido: Secondary | ICD-10-CM | POA: Insufficient documentation

## 2023-10-18 DIAGNOSIS — Z923 Personal history of irradiation: Secondary | ICD-10-CM | POA: Insufficient documentation

## 2023-10-18 DIAGNOSIS — Z9221 Personal history of antineoplastic chemotherapy: Secondary | ICD-10-CM | POA: Insufficient documentation

## 2023-10-18 DIAGNOSIS — F17210 Nicotine dependence, cigarettes, uncomplicated: Secondary | ICD-10-CM | POA: Insufficient documentation

## 2023-10-18 DIAGNOSIS — Z87891 Personal history of nicotine dependence: Secondary | ICD-10-CM | POA: Diagnosis not present

## 2023-10-18 DIAGNOSIS — C531 Malignant neoplasm of exocervix: Secondary | ICD-10-CM | POA: Insufficient documentation

## 2023-10-18 DIAGNOSIS — C539 Malignant neoplasm of cervix uteri, unspecified: Secondary | ICD-10-CM | POA: Insufficient documentation

## 2023-10-18 DIAGNOSIS — Z8042 Family history of malignant neoplasm of prostate: Secondary | ICD-10-CM | POA: Insufficient documentation

## 2023-10-18 DIAGNOSIS — Z5112 Encounter for antineoplastic immunotherapy: Secondary | ICD-10-CM | POA: Diagnosis present

## 2023-10-18 DIAGNOSIS — D61818 Other pancytopenia: Secondary | ICD-10-CM | POA: Diagnosis not present

## 2023-10-18 DIAGNOSIS — F520 Hypoactive sexual desire disorder: Secondary | ICD-10-CM | POA: Insufficient documentation

## 2023-10-18 DIAGNOSIS — Z79620 Long term (current) use of immunosuppressive biologic: Secondary | ICD-10-CM | POA: Insufficient documentation

## 2023-10-18 NOTE — Patient Instructions (Addendum)
 Return prn  Low Sex Drive in Women Diagnosis If low sex desire concerns you, talk with your gynecologist or another member of your healthcare team. For some women, low sex drive is part of an ongoing condition called sexual interest-arousal disorder. It involves having at least three of the following symptoms, which cause sadness or anxiety:  No desire to have any type of sexual activity or to masturbate. Few or no sexual thoughts or fantasies. Not wanting to make the first move in a sexual encounter with a partner. Less or no pleasure during sexual activity. Less or no interest in any sexual or erotic cues from a partner. Few or no physical sensations during sexual activity in most sexual encounters. You don't have to fit this definition to reach out for help. Your healthcare professional can look for reasons that your sex drive isn't as high as you'd like.  During your appointment, your healthcare professional asks you questions about your medical and sexual history. Your health professional also might:  Do a pelvic exam. This checks for signs of physical changes that sometimes play a role in low sexual desire. These changes can include certain skin diseases of the vulva, thinning of the vaginal tissues, vaginal dryness or pain-triggering spots. Recommend testing. Blood tests can check hormone levels. They also can look for thyroid problems, diabetes, high cholesterol and liver disorders. Refer you to a specialist. A counselor or sex therapist can help check for emotional and relationship factors that can cause low sex drive. More Information Pelvic exam Treatment Most women benefit from a treatment approach aimed at the many causes behind this condition. Recommendations may include sex education, counseling, and sometimes medicine and hormone therapy.  Sex education and counseling Talking with a sex therapist or counselor skilled in addressing sexual concerns can help with low sex drive.  Therapy often includes education about sexual response and techniques. Your therapist or counselor likely will offer recommendations for reading material or couples' exercises. Couples counseling that addresses relationship issues also may help boost feelings of intimacy and desire.  Medications Your healthcare professional reviews any medicines you take. The review is done to see if any of the medicines tend to cause sexual side effects. For example, SSRI antidepressants such as paroxetine (Paxil) and fluoxetine (Prozac) may lower sex drive.  If your antidepressant might be the cause of your low sex drive, your healthcare professional may recommend that you:  Wait to see if your sex drive improves. Lower the amount of medicine you take, called the dose. Take a break from use of the antidepressant. Change your depression treatment. Switching to a different type of antidepressant may lead to fewer sexual side effects. Your healthcare professional may recommend medicines such as:  Mirtazapin (Remeron). Vilazodone (Viibryd). Bupropion (Forfivo XL, Wellbutrin XL, others). Vortioxetine (Trintellix). If you take an SSRI, your healthcare professional might add bupropion to your treatment.  Along with recommending counseling, your healthcare professional may prescribe a medicine to boost your libido. Options for women who have not yet reached menopause include:  Flibanserin (Addyi). This is a pill that you take once a day at bedtime. Side effects include low blood pressure, drowsiness, dizziness, upset stomach and fatigue. Drinking alcohol can make these side effects worse. So can taking a common medicine to treat vaginal yeast infections, called fluconazole (Diflucan). Bremelanotide (Vyleesi). You give yourself this shot just under the skin in the belly or thigh before sexual activity. Some women get an upset stomach after taking the medicine. This  is more common after the first shot. This side effect  tends to get better with the second shot. Other side effects include vomiting, flushing, headache and a skin reaction at the site of the injection. In the [place], these medicines aren't approved for use after menopause.  Hormone therapy Dryness or shrinking of the vagina is one of the hallmark symptoms of genitourinary syndrome of menopause (GSM). This condition might make sex not comfortable and, in turn, reduce your desire. Some hormone medicines that aim to relieve GSM symptoms could help make sex more comfortable. And being more comfortable during sex may boost your desire.  Hormone medicines include:  Estrogen. Estrogen comes in many forms. These include pills, patches, sprays and gels. Smaller amounts of estrogen are found in vaginal creams and a slow-releasing suppository or ring. Your healthcare professional can help you understand the risks and benefits of each form. Vaginal estrogen used in small doses is unlikely to raise the risk of breast cancer. But estrogen won't improve sexual functioning related to sexual interest-arousal disorder. Testosterone. This hormone plays a key role in female sexual function, even though testosterone level is much lower in women than in men. In the [place], testosterone isn't approved by the FDA to treat sexual conditions in women. Still, sometimes it's prescribed to help lift a lagging libido. Testosterone that is delivered to the blood through the skin may be helpful in women after menopause. At first, this treatment can be tried for up to six months. If it helps, it can be continued with close monitoring by a healthcare professional. The use of testosterone in women can cause acne, extra body hair, and mood or personality changes. Prasterone Andreas Newport). This vaginal insert delivers the hormone dehydroepiandrosterone directly to the vagina to help ease painful sex. You use this medicine nightly to ease the symptoms of moderate to serious vaginal  dryness linked with GSM. Ospemifene (Osphena). Taken daily, this pill can help ease painful sex symptoms in women with moderate to serious GSM. This medicine isn't approved for women who've had breast cancer or who have a high risk of breast cancer.

## 2023-10-19 ENCOUNTER — Encounter: Payer: Self-pay | Admitting: Hematology and Oncology

## 2023-10-19 ENCOUNTER — Encounter: Payer: Self-pay | Admitting: Obstetrics & Gynecology

## 2023-10-19 NOTE — Progress Notes (Signed)
 Assessment & Plan Hypoactive sexual desire Decreased libido post-cervical cancer treatment. Chemotherapy(leading to POI) /RT may contribute. No depression, anxiety, or relationship issues. Psychological factors may play a role. She was counseled re: importance of reframing expectations around the spontaneity of desire and, alternatively, emphasizing the responsive nature of desire. Therapy, including sex therapy, recommended. Wellbutrin suggested to increase sexual interest. . The role of androgen (ie, testosterone) therapy in this setting is also uncertain  -Recommend moisturizers; consider vaginal E2 - Provide information on Wellbutrin for potential use to increase sexual interest. - Recommend therapy, including mental health provider and/or sex therapy, to address psychological factors. - Suggest exploring ways to enhance intimacy and novelty in the sexual relationship. - Provide information on local gynecologists for establishing care.          Patient ID: Lorraine Yates, female   DOB: [birth year], 53 y.o.   MRN: [id number]  CC: low libido   HPI Lorraine Yates is a 53 y.o. female.  Discussed the use of AI scribe software for clinical note transcription with the patient, who gave verbal consent to proceed.  History of Present Illness Lorraine Yates is a 53 year old female with cervical cancer who presents with changes in libido. She was referred by Dr. Roselind Messier for evaluation of changes in libido.  She has been experiencing changes in libido since the beginning of the year. After resuming sexual activity with her partner of 20-30 years following a period of abstinence during cancer treatment, she initially experienced dyspareunia, which has since resolved. Despite this, she lacks the urge to initiate sexual activity and often finds herself thinking 'hurry up' during the act, although it feels 'all right' afterward. She sometimes feels repulsed by the idea of sexual activity and does not want her  partner to touch her. No history of sexual assault, depression, or anxiety. She is not on any antidepressants.  She is currently undergoing chemotherapy every six weeks for cervical cancer, which has impacted her sexual health and libido.  Her relationship with her partner is described as good, with no abuse or strife. She engages in romantic activities like going out to dinner, although she has not done so recently due to her partner's mother's illness. She denies any issues with depression or anxiety, stating she is 'just bored' and eager to return to work.      HPI  Past Medical History:  Diagnosis Date   Acquired pancytopenia (HCC)    Anemia    Blood transfusion without reported diagnosis    History of cancer chemotherapy    cervical cancer  01-20-2023  to 02-28-2023   History of radiation therapy    Cervix- 01/20/23-04/07/23-Dr. Antony Blackbird   Hypomagnesemia    Malignant neoplasm of exocervix [facility]) 11/2022   oncologist--- dr gorsuch/  radiation oncologist--- dr Roselind Messier;  dx 05/ 2024;   chemo 01-20-2023 to 02-28-2023;   IMRT 01-20-2023 to 03-03-2023  to start high dose radiation 03-10-2023 w/ tandem   Port-A-Cath in place 12/16/2022    Past Surgical History:  Procedure Laterality Date   APPENDECTOMY  08/2023   IR IMAGING GUIDED PORT INSERTION  12/16/2022   IR RADIOLOGIST EVAL & MGMT  05/18/2023   OPERATIVE ULTRASOUND N/A 03/10/2023   Procedure: OPERATIVE ULTRASOUND;  Surgeon: Antony Blackbird, MD;  Location: [facility];  Service: Urology;  Laterality: N/A;   OPERATIVE ULTRASOUND N/A 03/15/2023   Procedure: OPERATIVE ULTRASOUND;  Surgeon: Antony Blackbird, MD;  Location: [facility];  Service:  Urology;  Laterality: N/A;   OPERATIVE ULTRASOUND N/A 03/21/2023   Procedure: OPERATIVE ULTRASOUND;  Surgeon: Antony Blackbird, MD;  Location: [facility];  Service: Urology;  Laterality: N/A;   OPERATIVE ULTRASOUND N/A 03/31/2023   Procedure:  OPERATIVE ULTRASOUND;  Surgeon: Antony Blackbird, MD;  Location: [facility];  Service: Urology;  Laterality: N/A;   OPERATIVE ULTRASOUND N/A 04/07/2023   Procedure: OPERATIVE ULTRASOUND;  Surgeon: Antony Blackbird, MD;  Location: [facility];  Service: Urology;  Laterality: N/A;   TANDEM RING INSERTION N/A 03/10/2023   Procedure: TANDEM RING INSERTION;  Surgeon: Antony Blackbird, MD;  Location: [facility];  Service: Urology;  Laterality: N/A;   TANDEM RING INSERTION N/A 03/15/2023   Procedure: TANDEM RING INSERTION;  Surgeon: Antony Blackbird, MD;  Location: [facility];  Service: Urology;  Laterality: N/A;   TANDEM RING INSERTION N/A 03/21/2023   Procedure: TANDEM RING INSERTION;  Surgeon: Antony Blackbird, MD;  Location: [place];  Service: Urology;  Laterality: N/A;   TANDEM RING INSERTION N/A 03/31/2023   Procedure: TANDEM RING INSERTION;  Surgeon: Antony Blackbird, MD;  Location: [facility];  Service: Urology;  Laterality: N/A;   TANDEM RING INSERTION N/A 04/07/2023   Procedure: TANDEM RING INSERTION;  Surgeon: Antony Blackbird, MD;  Location: [facility];  Service: Urology;  Laterality: N/A;   XI ROBOTIC LAPAROSCOPIC ASSISTED APPENDECTOMY N/A 08/16/2023   Procedure: XI ROBOTIC LAPAROSCOPIC ASSISTED APPENDECTOMY;  Surgeon: Henrene Dodge, MD;  Location: [facility];  Service: General;  Laterality: N/A;    Family History  Problem Relation Age of Onset   Alzheimer's disease Mother    Alzheimer's disease Father    Prostate cancer Father    Hypertension Father    Sickle cell trait Sister    Pancreatic cancer Neg Hx    Colon cancer Neg Hx    Breast cancer Neg Hx    Endometrial cancer Neg Hx    Stomach cancer Neg Hx    Rectal cancer Neg Hx    Esophageal cancer Neg Hx     Social History Social History   Tobacco Use   Smoking status: Some Days    Current packs/day: 0.15    Types: Cigarettes    Smokeless tobacco: Never   Tobacco comments:    03-03-2023  Socially - 1 pack per week,   per pt started smoking age 89,  and has cut down to 1 cig per day trying to quit  Vaping Use   Vaping status: Never Used  Substance Use Topics   Alcohol use: Yes    Comment: socially   Drug use: Yes    Types: Marijuana    Comment: 03-03-2023  per pt average since cancer dx once monthly (previously THC - once q 2 weeks)    Allergies  Allergen Reactions   Demerol [Meperidine Hcl] Itching, Rash and Other (See Comments)    Redness and itching to IV insertion site    Hydromorphone Rash    Redness and itching to IV insertion site that extended up left arm 03/10/23    Current Outpatient Medications  Medication Sig Dispense Refill   acetaminophen (TYLENOL) 500 MG tablet Take 2 tablets (1,000 mg total) by mouth every 6 (six) hours as needed for mild pain (pain score 1-3).     Fiber Gummies 2 g CHEW Chew 1 each by mouth daily.     ibuprofen (ADVIL) 600 MG tablet Take 1 tablet (600  mg total) by mouth every 8 (eight) hours as needed for moderate pain (pain score 4-6). 60 tablet 1   No current facility-administered medications for this visit.    Review of Systems Review of Systems Constitutional: negative for fatigue and weight loss Respiratory: negative for cough and wheezing Cardiovascular: negative for chest pain, fatigue and palpitations Gastrointestinal: negative for abdominal pain and change in bowel habits Genitourinary:negative for abnormal discharge Integument/breast: negative for nipple discharge Musculoskeletal:negative for myalgias Neurological: negative for gait problems and tremors Behavioral/Psych: negative for abusive relationship, depression Endocrine: negative for temperature intolerance      Blood pressure 114/89, pulse 79, temperature 98.2 F (36.8 C), temperature source Oral, resp. rate 20, weight 158 lb 4.8 oz (71.8 kg), last menstrual period 06/11/2022, SpO2  100%.  Physical Exam Deferred

## 2023-10-24 ENCOUNTER — Telehealth: Payer: Self-pay | Admitting: *Deleted

## 2023-10-24 ENCOUNTER — Other Ambulatory Visit: Payer: Self-pay | Admitting: Obstetrics & Gynecology

## 2023-10-24 DIAGNOSIS — F520 Hypoactive sexual desire disorder: Secondary | ICD-10-CM

## 2023-10-24 MED ORDER — BUPROPION HCL ER (SR) 150 MG PO TB12: 150.0000 mg | ORAL_TABLET | Freq: Two times a day (BID) | ORAL | 3 refills | Status: AC

## 2023-10-24 NOTE — Progress Notes (Unsigned)
 Patients should be observed for increased anxiety, insomnia. Recommend B/P check--can be at [facility] 1 mth after starting to monitor for increased B/P

## 2023-10-24 NOTE — Telephone Encounter (Signed)
 Spoke with Ms. Fabian Holster who called the office requesting to speak with Dr. Gaylin Ke. Last visit October 18, 2023 with Dr. Gaylin Ke, Pt discussed Rx for Wellbutrin and is wanting to start this medication. Advised patient that her message would be relayed to provider and the office would call back.

## 2023-10-24 NOTE — Telephone Encounter (Signed)
 Spoke with Lorraine Yates and relayed  message from Dr. Gaylin Ke that Rx for Wellbutrin has been called in. Pt should be observed for increased anxiety, insomnia and elevated blood pressure. BP [facility] be checked at [facility] 1 month after starting and monitored. Pt verbalized understanding and thanked the office for calling.

## 2023-11-02 ENCOUNTER — Encounter: Payer: Self-pay | Admitting: Hematology and Oncology

## 2023-11-03 ENCOUNTER — Encounter: Payer: Self-pay | Admitting: Hematology and Oncology

## 2023-11-03 ENCOUNTER — Inpatient Hospital Stay (HOSPITAL_BASED_OUTPATIENT_CLINIC_OR_DEPARTMENT_OTHER): Admitting: Hematology and Oncology

## 2023-11-03 ENCOUNTER — Inpatient Hospital Stay

## 2023-11-03 VITALS — BP 136/92 | HR 80 | Temp 97.5°F | Resp 18 | Ht 63.0 in | Wt 159.6 lb

## 2023-11-03 DIAGNOSIS — D61818 Other pancytopenia: Secondary | ICD-10-CM | POA: Diagnosis not present

## 2023-11-03 DIAGNOSIS — C531 Malignant neoplasm of exocervix: Secondary | ICD-10-CM

## 2023-11-03 DIAGNOSIS — D508 Other iron deficiency anemias: Secondary | ICD-10-CM

## 2023-11-03 DIAGNOSIS — N939 Abnormal uterine and vaginal bleeding, unspecified: Secondary | ICD-10-CM

## 2023-11-03 DIAGNOSIS — Z5112 Encounter for antineoplastic immunotherapy: Secondary | ICD-10-CM | POA: Diagnosis not present

## 2023-11-03 LAB — CMP (CANCER CENTER ONLY)
ALT: 11 U/L (ref 0–44)
AST: 14 U/L — ABNORMAL LOW (ref 15–41)
Albumin: 4.4 g/dL (ref 3.5–5.0)
Alkaline Phosphatase: 82 U/L (ref 38–126)
Anion gap: 5 (ref 5–15)
BUN: 18 mg/dL (ref 6–20)
CO2: 29 mmol/L (ref 22–32)
Calcium: 9.6 mg/dL (ref 8.9–10.3)
Chloride: 107 mmol/L (ref 98–111)
Creatinine: 0.9 mg/dL (ref 0.44–1.00)
GFR, Estimated: >60 mL/min (ref >60)
Glucose, Bld: 106 mg/dL — ABNORMAL HIGH (ref 70–99)
Potassium: 3.6 mmol/L (ref 3.5–5.1)
Sodium: 141 mmol/L (ref 135–145)
Total Bilirubin: 0.3 mg/dL (ref 0.0–1.2)
Total Protein: 7.3 g/dL (ref 6.5–8.1)

## 2023-11-03 LAB — CBC WITH DIFFERENTIAL (CANCER CENTER ONLY)
Abs Immature Granulocytes: 0 10*3/uL (ref 0.00–0.07)
Basophils Absolute: 0 10*3/uL (ref 0.0–0.1)
Basophils Relative: 1 %
Eosinophils Absolute: 0 10*3/uL (ref 0.0–0.5)
Eosinophils Relative: 2 %
HCT: 32.4 % — ABNORMAL LOW (ref 36.0–46.0)
Hemoglobin: 11.1 g/dL — ABNORMAL LOW (ref 12.0–15.0)
Immature Granulocytes: 0 %
Lymphocytes Relative: 13 %
Lymphs Abs: 0.3 10*3/uL — ABNORMAL LOW (ref 0.7–4.0)
MCH: 32.9 pg (ref 26.0–34.0)
MCHC: 34.3 g/dL (ref 30.0–36.0)
MCV: 96.1 fL (ref 80.0–100.0)
Monocytes Absolute: 0.3 10*3/uL (ref 0.1–1.0)
Monocytes Relative: 14 %
Neutro Abs: 1.5 10*3/uL — ABNORMAL LOW (ref 1.7–7.7)
Neutrophils Relative %: 70 %
Platelet Count: 249 10*3/uL (ref 150–400)
RBC: 3.37 MIL/uL — ABNORMAL LOW (ref 3.87–5.11)
RDW: 13.1 % (ref 11.5–15.5)
WBC Count: 2.1 10*3/uL — ABNORMAL LOW (ref 4.0–10.5)
nRBC: 0 % (ref 0.0–0.2)

## 2023-11-03 LAB — TSH: TSH: 0.996 u[IU]/mL (ref 0.350–4.500)

## 2023-11-03 MED ORDER — SODIUM CHLORIDE 0.9 % IV SOLN: Freq: Once | INTRAVENOUS | Status: AC

## 2023-11-03 MED ORDER — HEPARIN SOD (PORK) LOCK FLUSH 100 UNIT/ML IV SOLN
500.0000 [IU] | Freq: Once | INTRAVENOUS | Status: AC | PRN
Start: 2023-11-03 — End: 2023-11-03
  Administered 2023-11-03: 500 [IU]

## 2023-11-03 MED ORDER — SODIUM CHLORIDE 0.9% FLUSH
10.0000 mL | Freq: Once | INTRAVENOUS | Status: AC
  Administered 2023-11-03: 10 mL

## 2023-11-03 MED ORDER — SODIUM CHLORIDE 0.9 % IV SOLN
400.0000 mg | Freq: Once | INTRAVENOUS | Status: AC
  Administered 2023-11-03: 400 mg via INTRAVENOUS
  Filled 2023-11-03: qty 16

## 2023-11-03 MED ORDER — SODIUM CHLORIDE 0.9% FLUSH
10.0000 mL | INTRAVENOUS | Status: DC | PRN
Start: 2023-11-03 — End: 2023-11-03
  Administered 2023-11-03: 10 mL

## 2023-11-03 NOTE — Patient Instructions (Signed)

## 2023-11-03 NOTE — Progress Notes (Signed)
 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[facility]) 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, molecular testing show PD-L1 with CPS score of 1% Imaging study showed pelvic lymphadenopathy She had excellent response with concurrent chemoradiation therapy with cisplatin  and pembrolizumab  Radiation treatment completed by September 2024 and she continues on maintenance pembrolizumab   CT imaging from March 2024 showed no evidence of disease Overall, she has excellent response to therapy We will continue maintenance pembrolizumab  until end of the year Her next imaging will be in September Pancytopenia, acquired [facility]) This is due to recent treatment  She is not symptomatic Will proceed without delay Observe closely for now  Orders Placed This Encounter  Procedures   CBC with Differential (Cancer Center Only)    Standing Status:   Future    Expected Date:   05/31/2024    Expiration Date:   05/31/2025   CMP (Cancer Center only)    Standing Status:   Future    Expected Date:   05/31/2024    Expiration Date:   05/31/2025   T4    Standing Status:   Future    Expected Date:   05/31/2024    Expiration Date:   05/31/2025   TSH    Standing Status:   Future    Expected Date:   05/31/2024    Expiration Date:   05/31/2025     Almeda Jacobs, MD  INTERVAL HISTORY: she returns for treatment follow-up on maintenance  treatment Complications related to previous cycle of chemotherapy included pancytopenia, She has quit smoking.  No recent alcohol intake  PHYSICAL EXAMINATION: ECOG PERFORMANCE STATUS: 0 - Asymptomatic  Vitals:   11/03/23 1243  BP: (!) 136/92  Pulse: 80  Resp: 18  Temp: (!) 97.5 F (36.4 C)  SpO2: 100%   Filed Weights   11/03/23 1243  Weight: 159 lb 9.6 oz (72.4 kg)    Relevant data reviewed during this visit included CBC, CMP and thyroid  function test

## 2023-11-03 NOTE — Assessment & Plan Note (Addendum)
 This is due to recent treatment  She is not symptomatic Will proceed without delay Observe closely for now

## 2023-11-03 NOTE — Assessment & Plan Note (Addendum)
 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

## 2023-11-04 LAB — T4: T4, Total: 7.1 ug/dL (ref 4.5–12.0)

## 2023-11-07 ENCOUNTER — Encounter: Payer: Self-pay | Admitting: Gynecologic Oncology

## 2023-11-07 ENCOUNTER — Telehealth: Payer: Self-pay

## 2023-11-07 ENCOUNTER — Encounter (HOSPITAL_COMMUNITY): Payer: Self-pay | Admitting: *Deleted

## 2023-11-07 ENCOUNTER — Ambulatory Visit (HOSPITAL_COMMUNITY)
Admission: EM | Admit: 2023-11-07 | Discharge: 2023-11-07 | Disposition: A | Discharge status: Home / Self Care | Attending: Family Medicine | Admitting: Family Medicine

## 2023-11-07 DIAGNOSIS — B029 Zoster without complications: Secondary | ICD-10-CM

## 2023-11-07 MED ORDER — VALACYCLOVIR HCL 1 G PO TABS: 1000.0000 mg | ORAL_TABLET | Freq: Three times a day (TID) | ORAL | 0 refills | Status: AC

## 2023-11-07 NOTE — ED Provider Notes (Signed)
 [facility]    CSN: [id number] Arrival date & time: 11/07/23  1001      History   Chief Complaint Chief Complaint  Patient presents with   Rash    HPI Lorraine Yates is a 53 y.o. female.    Rash  She is here today with an itchy/painful rash down the right leg x 3 days.  She has never had this before.  She did send to a pic to her oncologist and concern for shingles.  She has h/o cervical cancer, on maintenance medication at this time.       Past Medical History:  Diagnosis Date   Acquired pancytopenia (HCC)    Anemia    Blood transfusion without reported diagnosis    History of cancer chemotherapy    cervical cancer  01-20-2023  to 02-28-2023   History of radiation therapy    Cervix- 01/20/23-04/07/23-Dr. Retta Caster   Hypomagnesemia    Malignant neoplasm of exocervix [facility]) 11/2022   oncologist--- dr gorsuch/  radiation oncologist--- dr Eloise Hake;  dx 05/ 2024;   chemo 01-20-2023 to 02-28-2023;   IMRT 01-20-2023 to 03-03-2023  to start high dose radiation 03-10-2023 w/ tandem   Port-A-Cath in place 12/16/2022    Patient Active Problem List   Diagnosis Date Noted   Perforated appendicitis 04/27/2023   Acute appendicitis with perforation, generalized peritonitis, and abscess 04/26/2023   Liver abscess 04/26/2023   RUQ pain 04/22/2023   Pancytopenia, acquired (HCC) 02/23/2023   Ototoxicity 02/23/2023   Diarrhea 02/01/2023   Other constipation 01/26/2023   Cancer associated pain 01/12/2023   Cervical cancer (HCC) 11/29/2022   Iron  deficiency anemia 11/29/2022   Cervical mass 11/28/2022   Abnormal uterine bleeding (AUB) 11/28/2022   Tobacco abuse 11/28/2022   Hemorrhoids 11/28/2022    Past Surgical History:  Procedure Laterality Date   APPENDECTOMY  08/2023   IR IMAGING GUIDED PORT INSERTION  12/16/2022   IR RADIOLOGIST EVAL & MGMT  05/18/2023   OPERATIVE ULTRASOUND N/A 03/10/2023   Procedure: OPERATIVE ULTRASOUND;  Surgeon: Retta Caster, MD;   Location: [facility];  Service: Urology;  Laterality: N/A;   OPERATIVE ULTRASOUND N/A 03/15/2023   Procedure: OPERATIVE ULTRASOUND;  Surgeon: Retta Caster, MD;  Location: [facility];  Service: Urology;  Laterality: N/A;   OPERATIVE ULTRASOUND N/A 03/21/2023   Procedure: OPERATIVE ULTRASOUND;  Surgeon: Retta Caster, MD;  Location: [facility];  Service: Urology;  Laterality: N/A;   OPERATIVE ULTRASOUND N/A 03/31/2023   Procedure: OPERATIVE ULTRASOUND;  Surgeon: Retta Caster, MD;  Location: [facility];  Service: Urology;  Laterality: N/A;   OPERATIVE ULTRASOUND N/A 04/07/2023   Procedure: OPERATIVE ULTRASOUND;  Surgeon: Retta Caster, MD;  Location: [facility];  Service: Urology;  Laterality: N/A;   TANDEM RING INSERTION N/A 03/10/2023   Procedure: TANDEM RING INSERTION;  Surgeon: Retta Caster, MD;  Location: [facility];  Service: Urology;  Laterality: N/A;   TANDEM RING INSERTION N/A 03/15/2023   Procedure: TANDEM RING INSERTION;  Surgeon: Retta Caster, MD;  Location: [facility];  Service: Urology;  Laterality: N/A;   TANDEM RING INSERTION N/A 03/21/2023   Procedure: TANDEM RING INSERTION;  Surgeon: Retta Caster, MD;  Location: [facility];  Service: Urology;  Laterality: N/A;   TANDEM RING INSERTION N/A 03/31/2023   Procedure: TANDEM RING INSERTION;  Surgeon: Retta Caster, MD;  Location: [facility];  Service: Urology;  Laterality:  N/A;   TANDEM RING INSERTION N/A 04/07/2023   Procedure: TANDEM RING INSERTION;  Surgeon: Retta Caster, MD;  Location: [facility];  Service: Urology;  Laterality: N/A;   XI ROBOTIC LAPAROSCOPIC ASSISTED APPENDECTOMY N/A 08/16/2023   Procedure: XI ROBOTIC LAPAROSCOPIC ASSISTED APPENDECTOMY;  Surgeon: Emmalene Hare, MD;  Location: [facility];  Service: General;  Laterality: N/A;    OB History     Gravida   4   Para  4   Term      Preterm      AB      Living         SAB      IAB      Ectopic      Multiple      Live Births               Home Medications    Prior to Admission medications   Medication Sig Start Date End Date Taking? Authorizing Provider  valACYclovir (VALTREX) 1000 MG tablet Take 1 tablet (1,000 mg total) by mouth 3 (three) times daily for 7 days. 11/07/23 11/14/23 Yes Carmelia Tiner, Cleveland Dales, MD  acetaminophen  (TYLENOL ) 500 MG tablet Take 2 tablets (1,000 mg total) by mouth every 6 (six) hours as needed for mild pain (pain score 1-3). 08/16/23   Emmalene Hare, MD  buPROPion  (WELLBUTRIN  SR) 150 MG 12 hr tablet Take 1 tablet (150 mg total) by mouth 2 (two) times daily. 10/24/23 11/23/23  Abdul Hodgkin, MD  Fiber Gummies 2 g CHEW Chew 1 each by mouth daily. 09/13/23   Armbruster, Lendon Queen, MD  ibuprofen  (ADVIL ) 600 MG tablet Take 1 tablet (600 mg total) by mouth every 8 (eight) hours as needed for moderate pain (pain score 4-6). 08/16/23   Emmalene Hare, MD    Family History Family History  Problem Relation Age of Onset   Alzheimer's disease Mother    Alzheimer's disease Father    Prostate cancer Father    Hypertension Father    Sickle cell trait Sister    Pancreatic cancer Neg Hx    Colon cancer Neg Hx    Breast cancer Neg Hx    Endometrial cancer Neg Hx    Stomach cancer Neg Hx    Rectal cancer Neg Hx    Esophageal cancer Neg Hx     Social History Social History   Tobacco Use   Smoking status: Former    Current packs/day: 0.15    Types: Cigarettes   Smokeless tobacco: Never   Tobacco comments:    03-03-2023  Socially - 1 pack per week,   per pt started smoking age 65,  and has cut down to 1 cig per day trying to quit  Vaping Use   Vaping status: Never Used  Substance Use Topics   Alcohol use: Not Currently    Comment: socially   Drug use: Yes    Types: Marijuana    Comment: 03-03-2023  per pt average since cancer dx once monthly (previously THC  - once q 2 weeks)     Allergies   Bupropion , Demerol  [meperidine  hcl], and Hydromorphone    Review of Systems Review of Systems  Constitutional: Negative.   HENT: Negative.    Respiratory: Negative.    Cardiovascular: Negative.   Gastrointestinal: Negative.   Genitourinary: Negative.   Skin:  Positive for rash.     Physical Exam Triage Vital Signs ED Triage Vitals  Encounter Vitals Group     BP 11/07/23 1052 122/86  Systolic BP Percentile --      Diastolic BP Percentile --      Pulse Rate 11/07/23 1052 92     Resp 11/07/23 1052 18     Temp 11/07/23 1052 99.5 F (37.5 C)     Temp Source 11/07/23 1052 Oral     SpO2 11/07/23 1052 97 %     Weight --      Height --      Head Circumference --      Peak Flow --      Pain Score 11/07/23 1051 9     Pain Loc --      Pain Education --      Exclude from Growth Chart --    No data found.  Updated Vital Signs BP 122/86 (BP Location: Left Arm)   Pulse 92   Temp 99.5 F (37.5 C) (Oral)   Resp 18   LMP 06/11/2022   SpO2 97%   Visual Acuity Right Eye Distance:   Left Eye Distance:   Bilateral Distance:    Right Eye Near:   Left Eye Near:    Bilateral Near:     Physical Exam Constitutional:      Appearance: Normal appearance. She is normal weight.  Skin:    Comments: She has a blister like rash with an erythematous base from the right buttocks down posterior leg to the medial foot; consistent with S2 distribution  Neurological:     General: No focal deficit present.     Mental Status: She is alert.  Psychiatric:        Mood and Affect: Mood normal.      UC Treatments / Results  Labs (all labs ordered are listed, but only abnormal results are displayed) Labs Reviewed - No data to display  EKG   Radiology No results found.  Procedures Procedures (including critical care time)  Medications Ordered in UC Medications - No data to display  Initial Impression / Assessment and Plan / UC Course  I  have reviewed the triage vital signs and the nursing notes.  Pertinent labs & imaging results that were available during my care of the patient were reviewed by me and considered in my medical decision making (see chart for details).   Final Clinical Impressions(s) / UC Diagnoses   Final diagnoses:  Herpes zoster without complication     Discharge Instructions      You were diagnosed with shingles today.  I have sent out valtrex to treat this.  Please start as soon as possible to have it help the most.  Please keep the area of rash covered until all the pustules have crusted over.  If you continue with pain once the rash resolves, then please discuss with your primary care provider.     ED Prescriptions     Medication Sig Dispense Auth. Provider   valACYclovir (VALTREX) 1000 MG tablet Take 1 tablet (1,000 mg total) by mouth 3 (three) times daily for 7 days. 21 tablet Lesle Ras, MD      PDMP not reviewed this encounter.   Lesle Ras, MD 11/07/23 1106

## 2023-11-07 NOTE — Telephone Encounter (Signed)
 Lorraine Yates called stating she recently saw Dr.Jackson-Moore on 4/14. She was given Wellbutrin  SR 150mg . She states she started getting an itchy rash mainly on her right side, with pus like bumps. (She is going to try an send a picture through Mychart) They are painful, especially on her buttocks. She stopped the medication 3-4 days ago and has been using Cortisone 10 cream, which has helped. She is asking for something to be sent to help with the itching, to her on file pharmacy [facility].  Reports having a follow up appointment with Dr. Orvil Bland in July and will try to hold off using another medication like Wellbutrin . If she needs something else called in before July she will call office. (She is in the process of finding a PCP). Allergy list updated

## 2023-11-07 NOTE — Discharge Instructions (Signed)
 You were diagnosed with shingles today.  I have sent out valtrex to treat this.  Please start as soon as possible to have it help the most.  Please keep the area of rash covered until all the pustules have crusted over.  If you continue with pain once the rash resolves, then please discuss with your primary care provider.

## 2023-11-07 NOTE — ED Triage Notes (Signed)
 Pt states she has a rash down her right buttocks and right leg X 4 days. She sent a picture to her cancer doctor and they advised her it was shingles and needed to be seen.

## 2023-11-07 NOTE — Telephone Encounter (Signed)
 Pt is aware of below message from Vira Grieves NP regarding photos look like a case of shingles. Pt states she will go to urgent care to follow up.

## 2023-11-15 ENCOUNTER — Encounter: Payer: Self-pay | Admitting: Gastroenterology

## 2023-11-22 ENCOUNTER — Telehealth: Payer: Self-pay

## 2023-11-22 NOTE — Telephone Encounter (Signed)
 Returned her call. She is asking when the form from her work will be filled out. Told her it was sent to the office last week.. Given #'s for the forms department. She will call them if needed.

## 2023-11-23 ENCOUNTER — Telehealth: Payer: Self-pay

## 2023-11-23 NOTE — Telephone Encounter (Signed)
 FMLA form completed.

## 2023-11-23 NOTE — Telephone Encounter (Signed)
 LVM for pt regarding her forms for return to work. No questions or concerns at this time.

## 2023-11-23 NOTE — Telephone Encounter (Signed)
 The scanned copy is not complete; the FMLA is not signed until the information is completed Please let me know when it is ready

## 2023-11-23 NOTE — Telephone Encounter (Signed)
 Disability paperwork has been uploaded to the s drive to be completed.

## 2023-11-24 NOTE — Telephone Encounter (Signed)
 A new blank FMLA form has been uploaded to the s drive.

## 2023-11-25 ENCOUNTER — Encounter: Admitting: Gastroenterology

## 2023-12-14 NOTE — Progress Notes (Deleted)
 Lorraine Yates [id number]  12/14/23 Tx Plan Update: - Regimen: Cervical Pembrolizumab  (200) q21d, cisplatin  (40) q7d + XRT / pembrolizumab  (400) q42d    Oncology Line Flush Orders & Iron  Sucrose (Venofer ) 100-200 mg    - Action: Treatment is canceled - Reason:Toxicity/Complication - Additional orders IV fluids normal saline infusion, order under supportive care, Magnesium  replacement therapy ***, and Pain management *** See office note dated 12/14/23.  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[facility]) 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, molecular testing show PD-L1 with CPS score of 1% Imaging study showed pelvic lymphadenopathy She had excellent response with concurrent chemoradiation therapy with cisplatin  and pembrolizumab  Radiation treatment completed by September 2024 and she continues on maintenance pembrolizumab   CT imaging from March 2024 showed no evidence of disease Overall, she has excellent response to therapy We will continue maintenance pembrolizumab  until end of the year Her next imaging will be in September Pancytopenia, acquired [facility]) This is due to recent treatment  She is not symptomatic Will proceed without delay Observe closely for now  Orders Placed This Encounter  Procedures   CBC with Differential (Cancer Center Only)    Standing Status:   Future    Expected Date:    05/31/2024    Expiration Date:   05/31/2025   CMP (Cancer Center only)    Standing Status:   Future    Expected Date:   05/31/2024    Expiration Date:   05/31/2025   T4    Standing Status:   Future    Expected Date:   05/31/2024    Expiration Date:   05/31/2025   TSH    Standing Status:   Future    Expected Date:   05/31/2024    Expiration Date:   05/31/2025     Almeda Jacobs, MD  INTERVAL HISTORY: she returns for treatment follow-up on maintenance treatment Complications related to previous cycle of chemotherapy included pancytopenia, She has quit smoking.  No recent alcohol intake  PHYSICAL EXAMINATION: ECOG PERFORMANCE STATUS: 0 - Asymptomatic  Vitals:   11/03/23 1243  BP: (!) 136/92  Pulse: 80  Resp: 18  Temp: (!) 97.5 F (36.4 C)  SpO2: 100%   Filed Weights   11/03/23 1243  Weight: 159 lb 9.6 oz (72.4 kg)    Relevant data reviewed during this visit included CBC, CMP and thyroid  function test

## 2023-12-15 ENCOUNTER — Inpatient Hospital Stay

## 2023-12-15 ENCOUNTER — Inpatient Hospital Stay: Attending: Hematology and Oncology

## 2023-12-15 ENCOUNTER — Encounter: Payer: Self-pay | Admitting: Hematology and Oncology

## 2023-12-15 ENCOUNTER — Inpatient Hospital Stay: Attending: Hematology and Oncology | Admitting: Hematology and Oncology

## 2023-12-15 ENCOUNTER — Other Ambulatory Visit: Payer: Self-pay | Admitting: *Deleted

## 2023-12-15 VITALS — BP 142/95 | HR 86 | Resp 18 | Ht 63.0 in | Wt 160.8 lb

## 2023-12-15 DIAGNOSIS — Z5112 Encounter for antineoplastic immunotherapy: Secondary | ICD-10-CM | POA: Insufficient documentation

## 2023-12-15 DIAGNOSIS — C531 Malignant neoplasm of exocervix: Secondary | ICD-10-CM

## 2023-12-15 DIAGNOSIS — D61818 Other pancytopenia: Secondary | ICD-10-CM

## 2023-12-15 DIAGNOSIS — Z79620 Long term (current) use of immunosuppressive biologic: Secondary | ICD-10-CM | POA: Diagnosis not present

## 2023-12-15 DIAGNOSIS — N930 Postcoital and contact bleeding: Secondary | ICD-10-CM | POA: Insufficient documentation

## 2023-12-15 DIAGNOSIS — D508 Other iron deficiency anemias: Secondary | ICD-10-CM

## 2023-12-15 DIAGNOSIS — N939 Abnormal uterine and vaginal bleeding, unspecified: Secondary | ICD-10-CM

## 2023-12-15 DIAGNOSIS — Z9221 Personal history of antineoplastic chemotherapy: Secondary | ICD-10-CM | POA: Diagnosis not present

## 2023-12-15 DIAGNOSIS — Z923 Personal history of irradiation: Secondary | ICD-10-CM | POA: Insufficient documentation

## 2023-12-15 DIAGNOSIS — C538 Malignant neoplasm of overlapping sites of cervix uteri: Secondary | ICD-10-CM

## 2023-12-15 LAB — CMP (CANCER CENTER ONLY)
ALT: 28 U/L (ref 0–44)
AST: 27 U/L (ref 15–41)
Albumin: 4.3 g/dL (ref 3.5–5.0)
Alkaline Phosphatase: 87 U/L (ref 38–126)
Anion gap: 5 (ref 5–15)
BUN: 18 mg/dL (ref 6–20)
CO2: 28 mmol/L (ref 22–32)
Calcium: 9.3 mg/dL (ref 8.9–10.3)
Chloride: 106 mmol/L (ref 98–111)
Creatinine: 1.01 mg/dL — ABNORMAL HIGH (ref 0.44–1.00)
GFR, Estimated: >60 mL/min (ref >60)
Glucose, Bld: 101 mg/dL — ABNORMAL HIGH (ref 70–99)
Potassium: 3.9 mmol/L (ref 3.5–5.1)
Sodium: 139 mmol/L (ref 135–145)
Total Bilirubin: 0.3 mg/dL (ref 0.0–1.2)
Total Protein: 7.3 g/dL (ref 6.5–8.1)

## 2023-12-15 LAB — TSH: TSH: 1.21 u[IU]/mL (ref 0.350–4.500)

## 2023-12-15 LAB — CBC WITH DIFFERENTIAL (CANCER CENTER ONLY)
Abs Immature Granulocytes: 0 10*3/uL (ref 0.00–0.07)
Basophils Absolute: 0 10*3/uL (ref 0.0–0.1)
Basophils Relative: 1 %
Eosinophils Absolute: 0.1 10*3/uL (ref 0.0–0.5)
Eosinophils Relative: 3 %
HCT: 34.1 % — ABNORMAL LOW (ref 36.0–46.0)
Hemoglobin: 11.4 g/dL — ABNORMAL LOW (ref 12.0–15.0)
Immature Granulocytes: 0 %
Lymphocytes Relative: 24 %
Lymphs Abs: 0.6 10*3/uL — ABNORMAL LOW (ref 0.7–4.0)
MCH: 31.7 pg (ref 26.0–34.0)
MCHC: 33.4 g/dL (ref 30.0–36.0)
MCV: 94.7 fL (ref 80.0–100.0)
Monocytes Absolute: 0.3 10*3/uL (ref 0.1–1.0)
Monocytes Relative: 11 %
Neutro Abs: 1.7 10*3/uL (ref 1.7–7.7)
Neutrophils Relative %: 61 %
Platelet Count: 279 10*3/uL (ref 150–400)
RBC: 3.6 MIL/uL — ABNORMAL LOW (ref 3.87–5.11)
RDW: 13.2 % (ref 11.5–15.5)
WBC Count: 2.7 10*3/uL — ABNORMAL LOW (ref 4.0–10.5)
nRBC: 0 % (ref 0.0–0.2)

## 2023-12-15 LAB — VITAMIN B12: Vitamin B-12: 229 pg/mL (ref 180–914)

## 2023-12-15 MED ORDER — SODIUM CHLORIDE 0.9 % IV SOLN
400.0000 mg | Freq: Once | INTRAVENOUS | Status: AC
  Administered 2023-12-15: 400 mg via INTRAVENOUS
  Filled 2023-12-15: qty 16

## 2023-12-15 MED ORDER — HEPARIN SOD (PORK) LOCK FLUSH 100 UNIT/ML IV SOLN
500.0000 [IU] | Freq: Once | INTRAVENOUS | Status: DC | PRN
Start: 2023-12-15 — End: 2023-12-15

## 2023-12-15 MED ORDER — SODIUM CHLORIDE 0.9% FLUSH: 10.0000 mL | INTRAVENOUS | Status: DC | PRN

## 2023-12-15 MED ORDER — ESTRADIOL 0.1 MG/GM VA CREA: 1.0000 | TOPICAL_CREAM | Freq: Every day | VAGINAL | 12 refills | Status: AC

## 2023-12-15 MED ORDER — SODIUM CHLORIDE 0.9% FLUSH
10.0000 mL | Freq: Once | INTRAVENOUS | Status: AC
  Administered 2023-12-15: 10 mL

## 2023-12-15 MED ORDER — SODIUM CHLORIDE 0.9 % IV SOLN: Freq: Once | INTRAVENOUS | Status: AC

## 2023-12-15 NOTE — Progress Notes (Signed)
 [facility] OFFICE PROGRESS NOTE  Patient Care Team: Pcp, No as PCP - General Almeda Jacobs, MD as Consulting Physician (Hematology and Oncology) Suzi Essex, MD as Consulting Physician (Gynecologic Oncology) Candyce Champagne, MD as Consulting Physician (General Surgery) Armbruster, Lendon Queen, MD as Consulting Physician (Gastroenterology) Retta Caster, MD as Consulting Physician (Radiation Oncology)  Assessment & Plan Malignant neoplasm of overlapping sites of cervix [facility]) 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, molecular testing show PD-L1 with CPS score of 1% Imaging study showed pelvic lymphadenopathy She had excellent response with concurrent chemoradiation therapy with cisplatin  and pembrolizumab  Radiation treatment completed by September 2024 and she continues on maintenance pembrolizumab   CT imaging from March 2024 showed no evidence of disease Overall, she has excellent response to therapy We will continue maintenance pembrolizumab  until end of the year Her next imaging will be in September Pancytopenia, acquired [facility]) She is not symptomatic Will proceed without delay  Due to pancytopenia, I will add vitamin B12 level Postcoital bleeding She has intermittent postcoital bleeding with negative pelvic exam I recommend topical estrogen cream and she agreed She is instructed to use plenty of lubricants before sexual intercourse  Orders Placed This Encounter  Procedures   Vitamin B12    Standing Status:   Future    Number of Occurrences:   1    Expiration Date:   12/14/2024     Almeda Jacobs, MD  INTERVAL HISTORY: she returns for treatment follow-up Complications related to previous cycle of chemotherapy included infection, and postcoital bleeding She had diagnosis of shingles since her last visit  but recovered fully with antiviral treatment She complained of intermittent postcoital bleeding Otherwise, she tolerated treatment very well  PHYSICAL EXAMINATION: ECOG PERFORMANCE STATUS: 1 - Symptomatic but completely ambulatory  No results found for: "CAN125"    Latest Ref Rng & Units 12/15/2023   11:20 AM 11/03/2023   12:27 PM 09/15/2023   12:33 PM  CBC  WBC 4.0 - 10.5 K/uL 2.7  2.1  3.2   Hemoglobin 12.0 - 15.0 g/dL 78.2  95.6  21.3   Hematocrit 36.0 - 46.0 % 34.1  32.4  34.9   Platelets 150 - 400 K/uL 279  249  248       Chemistry      Component Value Date/Time   NA 139 12/15/2023 1120   K 3.9 12/15/2023 1120   CL 106 12/15/2023 1120   CO2 28 12/15/2023 1120   BUN 18 12/15/2023 1120   CREATININE 1.01 (H) 12/15/2023 1120      Component Value Date/Time   CALCIUM 9.3 12/15/2023 1120   ALKPHOS 87 12/15/2023 1120   AST 27 12/15/2023 1120   ALT 28 12/15/2023 1120   BILITOT 0.3 12/15/2023 1120       Vitals:   12/15/23 1142  BP: (!) 142/95  Pulse: 86  Resp: 18  SpO2: 100%   Filed Weights   12/15/23 1142  Weight: 160 lb 12.8 oz (72.9 kg)   Other relevant data reviewed during this visit included CBC, CMP, recent encounter in April at urgent care

## 2023-12-15 NOTE — Assessment & Plan Note (Addendum)
 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

## 2023-12-15 NOTE — Assessment & Plan Note (Addendum)
 She has intermittent postcoital bleeding with negative pelvic exam I recommend topical estrogen cream and she agreed She is instructed to use plenty of lubricants before sexual intercourse

## 2023-12-15 NOTE — Assessment & Plan Note (Addendum)
 She is not symptomatic Will proceed without delay  Due to pancytopenia, I will add vitamin B12 level

## 2023-12-15 NOTE — Patient Instructions (Signed)

## 2023-12-16 ENCOUNTER — Ambulatory Visit: Payer: Self-pay | Admitting: Hematology and Oncology

## 2023-12-16 LAB — T4: T4, Total: 6.9 ug/dL (ref 4.5–12.0)

## 2023-12-16 NOTE — Telephone Encounter (Signed)
 Called and given below message. She verbalized understanding.

## 2023-12-16 NOTE — Telephone Encounter (Signed)
-----   Message from Lorraine Yates sent at 12/16/2023  7:55 AM EDT ----- Iven Mark, please call her, vitamin B12 is borderline low, I recommend OTC vitamin B12 1000 mcg daily PO

## 2024-01-17 ENCOUNTER — Telehealth: Payer: Self-pay

## 2024-01-17 ENCOUNTER — Encounter: Payer: Self-pay | Admitting: Hematology and Oncology

## 2024-01-17 ENCOUNTER — Telehealth: Payer: Self-pay | Admitting: *Deleted

## 2024-01-17 NOTE — Telephone Encounter (Signed)
 Completed [employer] FMLA paperwork for Lorraine Yates's spouse, [name].  Sent to provider to review, amend, sign and return to this nurse to return to claims benefit manager.

## 2024-01-17 NOTE — Telephone Encounter (Signed)
 FMLA paperwork has been uploaded to the s drive.

## 2024-01-18 ENCOUNTER — Encounter: Payer: Self-pay | Admitting: Gynecologic Oncology

## 2024-01-18 ENCOUNTER — Encounter: Payer: Self-pay | Admitting: Hematology and Oncology

## 2024-01-18 NOTE — Telephone Encounter (Signed)
 FMLA form completed.

## 2024-01-18 NOTE — Telephone Encounter (Signed)
 Received notification of completed FMLA request.  Faxed to [employer] [phone])  Copy to [facility] H.I[employer]M bin for items to be scanned.  Copy ready for pick up on next scheduled appointment.  No further instructions received, actions performed or required by this nurse.

## 2024-01-19 ENCOUNTER — Encounter: Payer: Self-pay | Admitting: Hematology and Oncology

## 2024-01-20 ENCOUNTER — Encounter: Payer: Self-pay | Admitting: Gynecologic Oncology

## 2024-01-20 ENCOUNTER — Inpatient Hospital Stay: Payer: BC Managed Care – PPO | Attending: Hematology and Oncology | Admitting: Gynecologic Oncology

## 2024-01-20 ENCOUNTER — Encounter: Payer: Self-pay | Admitting: Gastroenterology

## 2024-01-20 ENCOUNTER — Ambulatory Visit (INDEPENDENT_AMBULATORY_CARE_PROVIDER_SITE_OTHER): Admitting: Gastroenterology

## 2024-01-20 VITALS — BP 128/86 | HR 80 | Ht 63.0 in | Wt 160.5 lb

## 2024-01-20 VITALS — BP 119/83 | HR 82 | Temp 98.6°F | Resp 18 | Wt 162.3 lb

## 2024-01-20 DIAGNOSIS — Z5112 Encounter for antineoplastic immunotherapy: Secondary | ICD-10-CM | POA: Insufficient documentation

## 2024-01-20 DIAGNOSIS — C538 Malignant neoplasm of overlapping sites of cervix uteri: Secondary | ICD-10-CM | POA: Insufficient documentation

## 2024-01-20 DIAGNOSIS — K642 Third degree hemorrhoids: Secondary | ICD-10-CM

## 2024-01-20 DIAGNOSIS — K649 Unspecified hemorrhoids: Secondary | ICD-10-CM | POA: Insufficient documentation

## 2024-01-20 DIAGNOSIS — C539 Malignant neoplasm of cervix uteri, unspecified: Secondary | ICD-10-CM

## 2024-01-20 DIAGNOSIS — N951 Menopausal and female climacteric states: Secondary | ICD-10-CM | POA: Insufficient documentation

## 2024-01-20 DIAGNOSIS — R232 Flushing: Secondary | ICD-10-CM

## 2024-01-20 DIAGNOSIS — Z9221 Personal history of antineoplastic chemotherapy: Secondary | ICD-10-CM | POA: Diagnosis not present

## 2024-01-20 DIAGNOSIS — E538 Deficiency of other specified B group vitamins: Secondary | ICD-10-CM | POA: Insufficient documentation

## 2024-01-20 DIAGNOSIS — D509 Iron deficiency anemia, unspecified: Secondary | ICD-10-CM | POA: Insufficient documentation

## 2024-01-20 DIAGNOSIS — Z923 Personal history of irradiation: Secondary | ICD-10-CM | POA: Insufficient documentation

## 2024-01-20 NOTE — Progress Notes (Signed)
 53 year old female here for hemorrhoid banding.  Colonoscopy performed in December showing grade 3 hemorrhoids.  She has had problems with prolapse and bleeding for some time.  Denies any hard stools but does strain when using the bathroom at times.  Symptoms have been longstanding.  Recall she also has a history of cervical cancer status post XRT last year and currently on Keytruda  infusions.  She had no radiation changes in her colon on her colonoscopy.   We banded the left lateral hemorrhoid in March for her first treatment.  She states she had some moderate discomfort for the first 36 to 48 hours and then it resolved completely.  She thinks she has had some mild improvement in her overall hemorrhoid symptoms since the first band but she still has some prolapse that bothers her.  We discussed her options including surgery, she wishes to proceed with additional banding to see if more time with this will help rather than pursue operative intervention at this point in time    Colonoscopy 06/21/23: - Hemorrhoids were found on perianal exam (grade III - reduced on exam). - The terminal ileum appeared normal. - Many diverticula were found in the entire colon. - Internal hemorrhoids were found during retroflexion. - The exam was otherwise without abnormality. The appendiceal orifice is normal.      Appendectomy 08/17/23     PROCEDURE NOTE: The patient presents with symptomatic grade III  hemorrhoids, requesting rubber band ligation of his/her hemorrhoidal disease.  All risks, benefits and alternative forms of therapy were described and informed consent was obtained.     The anorectum was pre-medicated with 0.125% nitroglycerin The decision was made to band the RP internal hemorrhoid, and the [facility] System was used to perform band ligation without complication.  Digital anorectal examination was then performed to assure proper positioning of the band, and to adjust the banded tissue as required.   The patient was discharged home without pain or other issues.  Dietary and behavioral recommendations were given and along with follow-up instructions.     The following adjunctive treatments were recommended: Fiber supplement daily   The patient will return in 2-4 weeks for  follow-up and possible additional banding as required. No complications were encountered and the patient tolerated the procedure well.   Marcey Naval, MD [facility]

## 2024-01-20 NOTE — Patient Instructions (Signed)
 It was good to see you today.  I do not see or feel any evidence of cancer recurrence on your exam.  I will see you for follow-up in 6 months.  As always, if you develop any new and concerning symptoms before your next visit, please call to see me sooner.

## 2024-01-20 NOTE — Patient Instructions (Addendum)
 HEMORRHOID BANDING PROCEDURE    FOLLOW-UP CARE   The procedure you have had should have been relatively painless since the banding of the area involved does not have nerve endings and there is no pain sensation.  The rubber band cuts off the blood supply to the hemorrhoid and the band may fall off as soon as 48 hours after the banding (the band may occasionally be seen in the toilet bowl following a bowel movement). You may notice a temporary feeling of fullness in the rectum which should respond adequately to plain Tylenol  or Motrin .  Following the banding, avoid strenuous exercise that evening and resume full activity the next day.  A sitz bath (soaking in a warm tub) or bidet is soothing, and can be useful for cleansing the area after bowel movements.     To avoid constipation, take two tablespoons of natural wheat bran, natural oat bran, flax, Benefiber or any over the counter fiber supplement and increase your water intake to 7-8 glasses daily.    Unless you have been prescribed anorectal medication, do not put anything inside your rectum for two weeks: No suppositories, enemas, fingers, etc.  Occasionally, you may have more bleeding than usual after the banding procedure.  This is often from the untreated hemorrhoids rather than the treated one.  Don't be concerned if there is a tablespoon or so of blood.  If there is more blood than this, lie flat with your bottom higher than your head and apply an ice pack to the area. If the bleeding does not stop within a half an hour or if you feel faint, call our office at [phone] or go to the emergency room.  Problems are not common; however, if there is a substantial amount of bleeding, severe pain, chills, fever or difficulty passing urine (very rare) or other problems, you should call us  at [phone] or report to the nearest emergency room.  Do not stay seated continuously for more than 2-3 hours for a day or two after the procedure.   Tighten your buttock muscles 10-15 times every two hours and take 10-15 deep breaths every 1-2 hours.  Do not spend more than a few minutes on the toilet if you cannot empty your bowel; instead re-visit the toilet at a later time.     You have been scheduled for a 3rd banding appointment on Monday, 03-19-24 at 4:00pm. Please arrive 10 minutes early for registration. If you need to reschedule or cancel this appointment please call [phone] as soon as possible. Thank you.   Thank you for entrusting me with your care and for choosing [facility], Dr. Elspeth Naval    If your blood pressure at your visit was 140/90 or greater, please contact your primary care physician to follow up on this. ______________________________________________________  If you are age 51 or older, your body mass index should be between 23-30. Your Body mass index is 28.43 kg/m. If this is out of the aforementioned range listed, please consider follow up with your Primary Care Provider.  If you are age 67 or younger, your body mass index should be between 19-25. Your Body mass index is 28.43 kg/m. If this is out of the aformentioned range listed, please consider follow up with your Primary Care Provider.  ________________________________________________________  The [facility] providers would like to encourage you to use MYCHART to communicate with providers for non-urgent requests or questions.  Due to long hold times on the telephone, sending your  provider a message by MYCHART may be a faster and more efficient way to get a response.  Please allow 48 business hours for a response.  Please remember that this is for non-urgent requests.  _______________________________________________________  Due to recent changes in healthcare laws, you may see the results of your imaging and laboratory studies on MyChart before your provider has had a chance to review them.  We understand that in some cases there may be  results that are confusing or concerning to you. Not all laboratory results come back in the same time frame and the provider may be waiting for multiple results in order to interpret others.  Please give us  48 hours in order for your provider to thoroughly review all the results before contacting the office for clarification of your results.

## 2024-01-20 NOTE — Progress Notes (Signed)
 Gynecologic Oncology Return Clinic Visit  01/20/24  Reason for Visit: surveillance  Treatment History: Oncology History Overview Note  PD-L1 CPS 1%   Cervical cancer (HCC)  11/24/2022 Initial Diagnosis   The patient presented recently to the emergency department with both vaginal bleeding and rectal bleeding.  Exam in the emergency department showed a 6 cm irregular cervical mass.     11/24/2022 Imaging   CT pelvis 1. Heterogeneous cervical mass is highly worrisome for cervical carcinoma. Significant border of contact with the adjacent rectum. Difficult to exclude invasion. 2. Locule of air within the cervical mass may be due to necrosis or extension from the vagina. Difficult to exclude a fistula to the rectum.   11/24/2022 Imaging   US  pelvis 1. Large, approximately 7.6 cm vascular soft tissue mass of the lower uterine segment. This seems inseparable from the cervix and Cervical Carcinoma is not excluded. Alternatively this might be a large lower uterine fibroid. Recommend direct visualization of the cervix.   2. Elsewhere the uterus and endometrium are within normal limits. Normal ovaries and no free fluid.   11/26/2022 Pathology Results   SURGICAL PATHOLOGY  CASE: [id number]  PATIENT: Lorraine Yates  Surgical Pathology Report   FINAL MICROSCOPIC DIAGNOSIS:   A. 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  See comment.   COMMENT:   - Morphologic evaluation and p16 immunohistochemical stain reveal superficial squamous mucosa with findings consistent with high-grade squamous intraepithelial lesion.  The patient's clinical history of a cervical mass is noted, and a deeper more invasive process cannot be excluded.  Clinical and imaging correlation is recommended.      11/29/2022 Initial Diagnosis   Cervical cancer (HCC)   12/02/2022 Cancer Staging   Staging form: Cervix Uteri, AJCC Version 9 - Clinical stage from 12/02/2022: Stage IIIC1  (cT1b3, cN1, cM0) - Signed by Lonn Hicks, MD on 12/15/2022 Stage prefix: Initial diagnosis   12/15/2022 PET scan   1. Hypermetabolic cervical mass consistent with known cervical carcinoma. 2. There is a mildly FDG avid left pelvic sidewall lymph node which is equivocal for nodal metastasis. 3. No additional sites of disease identified. 4. Aortic Atherosclerosis (ICD10-I70.0).     12/16/2022 Procedure   Successful placement of a right internal jugular approach power injectable Port-A-Cath. The catheter is ready for immediate use.     12/17/2022 Pathology Results   SURGICAL PATHOLOGY  CASE: [id number]  PATIENT: Lorraine Yates  Surgical Pathology Report   Clinical History: malignant neoplasm of cervix, unspecified site   FINAL MICROSCOPIC DIAGNOSIS:   A. 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     12/18/2022 Imaging   1. The normal cervical anatomy is completely effaced by a bulky mass measuring 7.9 x 6.9 x 5.5 cm. Mass extends into the lower uterine segment with loss of parametrial definition about the posterior 180 degrees of the upper portion of the mass. Mass remains confined to the upper third of the vagina. 2. Preserved fat planes to the adjacent bladder and rectum. No ureteral invasion or hydronephrosis. 3. Small left pelvic sidewall lymph node measuring 0.9 x 0.6 cm, previously with equivocal FDG avidity and suspicious for a small nodal metastasis. No other enlarged lymph nodes in the pelvis.     01/20/2023 - 01/20/2023 Chemotherapy   Patient is on Treatment Plan : HEAD/NECK Cisplatin  (40) q7d  01/20/2023 -  Chemotherapy   Patient is on Treatment Plan : Cervical Pembrolizumab  (200) q21d, cisplatin  (40) q7d + XRT / pembrolizumab  (400) q42d      01/20/2023 - 04/07/2023 Radiation Therapy   Radiation treatment dates: 01/20/23 through  04/07/23 ( Pelvic IMRT : 01/20/23 through 03/03/23)  ( Brachytherapy :5 treatment sessions delivered from 03/10/23 through 04/07/23)   Site/dose:   1) Pelvic IMRT - 45 Gy delivered in 25 Fx at 1.8 Gy/Fx  2) Pelvic boost - 9 Gy delivered in 5 Fx at 1.8 Gy/Fx 3) Cervical brachytherapy - 27.5 Gy delivered in 5 Fx at 5/5 Gy/Fx    Technique/Mode:  1) IMRT / Photon  2) 3D / Photon  3) HDR Ir-192 / Brachytherapy    04/23/2023 Imaging   1. Nonshadowing echogenic focus along the fundus of the gallbladder may represent a nonshadowing stone or polyp. No evidence for acute cholecystitis. 2. Common bile duct is upper limits of normal at 6.0 mm.   09/26/2023 Imaging   1. Interval removal of the subhepatic drain and robotic assisted laparoscopic appendectomy. Small amount of ascites along the inferior margin of the liver and in the right lower quadrant. No focal fluid collection, omental nodularity or pneumoperitoneum identified. 2. No evidence of metastatic disease. 3. Stable nonspecific endometrial enhancement, possibly treatment related. 4. Mild perirectal soft tissue stranding, nonspecific and possibly treatment related as well.     Interval History: Doing well.  Tolerating immunotherapy without significant issues.  Had her second of 3 hemorrhoid bands performed today.  Notes that stool is somewhat runny still but not diarrhea.  Endorses some hot flashes.  Denies any vaginal bleeding or discharge.  Has had some blood in her stool related to hemorrhoids.  Past Medical/Surgical History: Past Medical History:  Diagnosis Date   Acquired pancytopenia (HCC)    Anemia    Blood transfusion without reported diagnosis    History of cancer chemotherapy    cervical cancer  01-20-2023  to 02-28-2023   History of radiation therapy    Cervix- 01/20/23-04/07/23-Dr. 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   oncologist--- dr gorsuch/  radiation oncologist--- dr  nasuti;  dx 05/ 2024;   chemo 01-20-2023 to 02-28-2023;   IMRT 01-20-2023 to 03-03-2023  to start high dose radiation 03-10-2023 w/ tandem   Port-A-Cath in place 12/16/2022    Past Surgical History:  Procedure Laterality Date   APPENDECTOMY  08/2023   IR IMAGING GUIDED PORT INSERTION  12/16/2022   IR RADIOLOGIST EVAL & MGMT  05/18/2023   OPERATIVE ULTRASOUND N/A 03/10/2023   Procedure: OPERATIVE ULTRASOUND;  Surgeon: Nasuti Lynwood, MD;  Location: [facility];  Service: Urology;  Laterality: N/A;   OPERATIVE ULTRASOUND N/A 03/15/2023   Procedure: OPERATIVE ULTRASOUND;  Surgeon: Nasuti Lynwood, MD;  Location: [facility];  Service: Urology;  Laterality: N/A;   OPERATIVE ULTRASOUND N/A 03/21/2023   Procedure: OPERATIVE ULTRASOUND;  Surgeon: Nasuti Lynwood, MD;  Location: [facility];  Service: Urology;  Laterality: N/A;   OPERATIVE ULTRASOUND N/A 03/31/2023   Procedure: OPERATIVE ULTRASOUND;  Surgeon: Nasuti Lynwood, MD;  Location: [facility];  Service: Urology;  Laterality: N/A;   OPERATIVE ULTRASOUND N/A 04/07/2023   Procedure: OPERATIVE ULTRASOUND;  Surgeon: Nasuti Lynwood, MD;  Location: [facility];  Service: Urology;  Laterality: N/A;   TANDEM RING INSERTION N/A 03/10/2023   Procedure: TANDEM RING INSERTION;  Surgeon: Nasuti Lynwood, MD;  Location: [facility];  Service: Urology;  Laterality: N/A;   TANDEM RING INSERTION N/A 03/15/2023   Procedure: TANDEM RING INSERTION;  Surgeon: Shannon Agent, MD;  Location: [facility];  Service: Urology;  Laterality: N/A;   TANDEM RING INSERTION N/A 03/21/2023   Procedure: TANDEM RING INSERTION;  Surgeon: Shannon Agent, MD;  Location[facility];  Service: Urology;  Laterality: N/A;   TANDEM RING INSERTION N/A 03/31/2023   Procedure: TANDEM RING INSERTION;  Surgeon: Shannon Agent, MD;  Location: [facility];  Service:  Urology;  Laterality: N/A;   TANDEM RING INSERTION N/A 04/07/2023   Procedure: TANDEM RING INSERTION;  Surgeon: Shannon Agent, MD;  Location: [facility];  Service: Urology;  Laterality: N/A;   XI ROBOTIC LAPAROSCOPIC ASSISTED APPENDECTOMY N/A 08/16/2023   Procedure: XI ROBOTIC LAPAROSCOPIC ASSISTED APPENDECTOMY;  Surgeon: Desiderio Schanz, MD;  Location: [facility];  Service: General;  Laterality: N/A;    Family History  Problem Relation Age of Onset   Alzheimer's disease Mother    Alzheimer's disease Father    Prostate cancer Father    Hypertension Father    Sickle cell trait Sister    Pancreatic cancer Neg Hx    Colon cancer Neg Hx    Breast cancer Neg Hx    Endometrial cancer Neg Hx    Stomach cancer Neg Hx    Rectal cancer Neg Hx    Esophageal cancer Neg Hx     Social History   Socioeconomic History   Marital status: Married    Spouse name: Not on file   Number of children: 4   Years of education: Not on file   Highest education level: Not on file  Occupational History   Occupation: personal shopper  Tobacco Use   Smoking status: Some Days    Current packs/day: 0.15    Types: Cigarettes   Smokeless tobacco: Never  Vaping Use   Vaping status: Never Used  Substance and Sexual Activity   Alcohol use: Not Currently    Comment: socially   Drug use: Yes    Types: Marijuana    Comment: 03-03-2023  per pt average since cancer dx once monthly (previously THC - once q 2 weeks)   Sexual activity: Yes    Birth control/protection: None  Other Topics Concern   Not on file  Social History Narrative   Not on file   Social Drivers of Health   Financial Resource Strain: Not on file  Food Insecurity: No Food Insecurity (04/26/2023)   Hunger Vital Sign    Worried About Running Out of Food in the Last Year: Never true    Ran Out of Food in the Last Year: Never true  Transportation Needs: No Transportation Needs (04/26/2023)   PRAPARE - Scientist, research (physical sciences) (Medical): No    Lack of Transportation (Non-Medical): No  Physical Activity: Not on file  Stress: Not on file  Social Connections: Moderately Isolated (11/25/2022)   Social Connection and Isolation Panel    Frequency of Communication with Friends and Family: More than three times a week    Frequency of Social Gatherings with Friends and Family: More than three times a week    Attends Religious Services: Never    Database administrator or Organizations: No    Attends Banker Meetings: Never    Marital Status: Married    Current Medications:  Current Outpatient Medications:  acetaminophen  (TYLENOL ) 500 MG tablet, Take 2 tablets (1,000 mg total) by mouth every 6 (six) hours as needed for mild pain (pain score 1-3)., Disp: , Rfl:    buPROPion  (WELLBUTRIN  SR) 150 MG 12 hr tablet, Take 1 tablet (150 mg total) by mouth 2 (two) times daily., Disp: 60 tablet, Rfl: 3   cyanocobalamin  (VITAMIN B12) 1000 MCG tablet, Take 1,000 mcg by mouth daily., Disp: , Rfl:    estradiol  (ESTRACE  VAGINAL) 0.1 MG/GM vaginal cream, Place 1 Applicatorful vaginally at bedtime., Disp: 42.5 g, Rfl: 12   Fiber Gummies 2 g CHEW, Chew 1 each by mouth daily., Disp: , Rfl:   Review of Systems: + Ringing in ears, blood in stool, hot flashes Denies appetite changes, fevers, chills, fatigue, unexplained weight changes. Denies hearing loss, neck lumps or masses, mouth sores or voice changes. Denies cough or wheezing.  Denies shortness of breath. Denies chest pain or palpitations. Denies leg swelling. Denies abdominal distention, pain, constipation, diarrhea, nausea, vomiting, or early satiety. Denies pain with intercourse, dysuria, frequency, hematuria or incontinence. Denies pelvic pain, vaginal bleeding or vaginal discharge.   Denies joint pain, back pain or muscle pain/cramps. Denies itching, rash, or wounds. Denies dizziness, headaches, numbness or seizures. Denies swollen lymph nodes or  glands, denies easy bruising or bleeding. Denies anxiety, depression, confusion, or decreased concentration.  Physical Exam: BP 119/83 (BP Location: Left Arm, Patient Position: Sitting)   Pulse 82   Temp 98.6 F (37 C) (Oral)   Resp 18   Wt 162 lb 4.8 oz (73.6 kg)   LMP 06/11/2022   SpO2 100%   BMI 28.75 kg/m  General: Alert, oriented, no acute distress. HEENT: Normocephalic, atraumatic, sclera anicteric. Chest: Clear to auscultation bilaterally.  No wheezes or rhonchi. Cardiovascular: Regular rate and rhythm, no murmurs. Abdomen: soft, nontender.  Normoactive bowel sounds.  No masses or hepatosplenomegaly appreciated.   Extremities: Grossly normal range of motion.  Warm, well perfused.  No edema bilaterally. Skin: No rashes or lesions noted. Lymphatics: No cervical, supraclavicular, or inguinal adenopathy. GU: Normal appearing external genitalia without erythema, excoriation, or lesions.  Speculum exam reveals no discharge or blood, vaginal mucosa is normal in appearance.  Mild radiation changes noted at the apex with cervix somewhat posterior facing.  No lesions or masses, no atypical vascularity.  Bimanual exam reveals cervix is soft, no firmness or nodularity appreciated.  Rectovaginal exam deferred given recent banding procedure.  Laboratory & Radiologic Studies: None new  Assessment & Plan: Lorraine Yates is a 53 y.o. woman with a history of Stage IIIC1 cervical cancer. She received definitive radiation with sensitizing Cisplatin  and pembrolizumab  (per Keynote A18 regimen), now on maintenance IO.  Diagnosed with acute perforated appendicitis shortly after finishing HDR brachytherapy (04/07/23). This was managed with IV antibiotics and drain placement.  Drain removed 05/18/23. She underwent follow-up colonoscopy on 06/21/23 showing many diverticula in the entire colon, normal terminal ileum, internal and external hemorrhoids. Most recent CT imaging in March 2025 showed no evidence of  metastatic disease.  Stable nonspecific endometrial enhancement, likely treatment related.   Patient is doing well.  NED on exam today.  Continues on maintenance treatment.   Ideally, she would get a posttreatment PET scan although this was deferred in the setting of her acute infection related to perforated appendicitis.  Discussed importance of continued intercourse versus vaginal dilator use.   Due for cotesting in 03/2024.  Discussed hot flashes.  She is not especially bothered by the symptoms.  Her  preference is not to take medication to treat the symptoms.  20 minutes of total time was spent for this patient encounter, including preparation, face-to-face counseling with the patient and coordination of care, and documentation of the encounter.  Comer Dollar, MD  Division of Gynecologic Oncology  Department of Obstetrics and Gynecology  [facility]

## 2024-01-21 ENCOUNTER — Other Ambulatory Visit: Payer: Self-pay

## 2024-01-26 ENCOUNTER — Ambulatory Visit: Payer: Self-pay | Admitting: Hematology and Oncology

## 2024-01-26 ENCOUNTER — Encounter: Payer: Self-pay | Admitting: Hematology and Oncology

## 2024-01-26 ENCOUNTER — Inpatient Hospital Stay (HOSPITAL_BASED_OUTPATIENT_CLINIC_OR_DEPARTMENT_OTHER): Admitting: Hematology and Oncology

## 2024-01-26 ENCOUNTER — Inpatient Hospital Stay

## 2024-01-26 ENCOUNTER — Other Ambulatory Visit: Payer: Self-pay | Admitting: *Deleted

## 2024-01-26 VITALS — BP 122/88 | HR 82 | Temp 98.1°F | Resp 18 | Wt 163.0 lb

## 2024-01-26 DIAGNOSIS — C538 Malignant neoplasm of overlapping sites of cervix uteri: Secondary | ICD-10-CM | POA: Diagnosis not present

## 2024-01-26 DIAGNOSIS — D539 Nutritional anemia, unspecified: Secondary | ICD-10-CM | POA: Diagnosis not present

## 2024-01-26 DIAGNOSIS — D508 Other iron deficiency anemias: Secondary | ICD-10-CM | POA: Diagnosis not present

## 2024-01-26 DIAGNOSIS — E538 Deficiency of other specified B group vitamins: Secondary | ICD-10-CM | POA: Insufficient documentation

## 2024-01-26 DIAGNOSIS — N939 Abnormal uterine and vaginal bleeding, unspecified: Secondary | ICD-10-CM

## 2024-01-26 DIAGNOSIS — C531 Malignant neoplasm of exocervix: Secondary | ICD-10-CM

## 2024-01-26 LAB — CBC WITH DIFFERENTIAL (CANCER CENTER ONLY)
Abs Immature Granulocytes: 0.01 K/uL (ref 0.00–0.07)
Basophils Absolute: 0 K/uL (ref 0.0–0.1)
Basophils Relative: 1 %
Eosinophils Absolute: 0.1 K/uL (ref 0.0–0.5)
Eosinophils Relative: 2 %
HCT: 31.6 % — ABNORMAL LOW (ref 36.0–46.0)
Hemoglobin: 10.7 g/dL — ABNORMAL LOW (ref 12.0–15.0)
Immature Granulocytes: 0 %
Lymphocytes Relative: 19 %
Lymphs Abs: 0.9 K/uL (ref 0.7–4.0)
MCH: 32 pg (ref 26.0–34.0)
MCHC: 33.9 g/dL (ref 30.0–36.0)
MCV: 94.6 fL (ref 80.0–100.0)
Monocytes Absolute: 0.4 K/uL (ref 0.1–1.0)
Monocytes Relative: 9 %
Neutro Abs: 3 K/uL (ref 1.7–7.7)
Neutrophils Relative %: 69 %
Platelet Count: 297 K/uL (ref 150–400)
RBC: 3.34 MIL/uL — ABNORMAL LOW (ref 3.87–5.11)
RDW: 14.3 % (ref 11.5–15.5)
WBC Count: 4.4 K/uL (ref 4.0–10.5)
nRBC: 0 % (ref 0.0–0.2)

## 2024-01-26 LAB — CMP (CANCER CENTER ONLY)
ALT: 12 U/L (ref 0–44)
AST: 16 U/L (ref 15–41)
Albumin: 4.2 g/dL (ref 3.5–5.0)
Alkaline Phosphatase: 83 U/L (ref 38–126)
Anion gap: 7 (ref 5–15)
BUN: 16 mg/dL (ref 6–20)
CO2: 29 mmol/L (ref 22–32)
Calcium: 9.2 mg/dL (ref 8.9–10.3)
Chloride: 105 mmol/L (ref 98–111)
Creatinine: 1.06 mg/dL — ABNORMAL HIGH (ref 0.44–1.00)
GFR, Estimated: >60 mL/min (ref >60)
Glucose, Bld: 99 mg/dL (ref 70–99)
Potassium: 4.5 mmol/L (ref 3.5–5.1)
Sodium: 141 mmol/L (ref 135–145)
Total Bilirubin: 0.2 mg/dL (ref 0.0–1.2)
Total Protein: 7.3 g/dL (ref 6.5–8.1)

## 2024-01-26 LAB — IRON AND IRON BINDING CAPACITY (CC-WL,HP ONLY)
Iron: 32 ug/dL (ref 28–170)
Saturation Ratios: 8 % — ABNORMAL LOW (ref 10.4–31.8)
TIBC: 426 ug/dL (ref 250–450)
UIBC: 394 ug/dL (ref 148–442)

## 2024-01-26 LAB — FERRITIN: Ferritin: 19 ng/mL (ref 11–307)

## 2024-01-26 LAB — TSH: TSH: 1.74 u[IU]/mL (ref 0.350–4.500)

## 2024-01-26 MED ORDER — SODIUM CHLORIDE 0.9 % IV SOLN
400.0000 mg | Freq: Once | INTRAVENOUS | Status: AC
  Administered 2024-01-26: 400 mg via INTRAVENOUS
  Filled 2024-01-26: qty 16

## 2024-01-26 MED ORDER — SODIUM CHLORIDE 0.9% FLUSH
10.0000 mL | Freq: Once | INTRAVENOUS | Status: AC
  Administered 2024-01-26: 10 mL

## 2024-01-26 MED ORDER — SODIUM CHLORIDE 0.9 % IV SOLN: Freq: Once | INTRAVENOUS | Status: DC

## 2024-01-26 NOTE — Assessment & Plan Note (Addendum)
 She has history of iron  deficiency anemia She remains anemic today Will recheck her iron  studies

## 2024-01-26 NOTE — Telephone Encounter (Signed)
-----   Message from Almarie Bedford sent at 01/26/2024  2:05 PM EDT ----- She is iron  deficient. Recommend oral iron  supplement daily at bedtime. Any OTC iron  is ok ----- Message ----- From: Interface, Lab In [place] Sent: 01/26/2024  10:35 AM EDT To: Almarie Bedford, MD

## 2024-01-26 NOTE — Progress Notes (Signed)
 [facility] OFFICE PROGRESS NOTE  Patient Care Team: Pcp, No as PCP - General Lonn Hicks, MD as Consulting Physician (Hematology and Oncology) [name], MD as Consulting Physician (Gynecologic Oncology) Sheldon Standing, MD as Consulting Physician (General Surgery) Armbruster, Standing SQUIBB, MD as Consulting Physician (Gastroenterology) Shannon Agent, MD as Consulting Physician (Radiation Oncology)  Assessment & Plan Malignant neoplasm of overlapping sites of cervix [facility]) 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, molecular testing show PD-L1 with CPS score of 1% Imaging study showed pelvic lymphadenopathy She had excellent response with concurrent chemoradiation therapy with cisplatin  and pembrolizumab  Radiation treatment completed by September 2024 and she continues on maintenance pembrolizumab   CT imaging from March 2024 showed no evidence of disease Overall, she has excellent response to therapy We will continue maintenance pembrolizumab  until end of August Her next imaging will be in September Other iron  deficiency anemia She has history of iron  deficiency anemia She remains anemic today Will recheck her iron  studies Vitamin B12 deficiency Her recent vitamin B12 level was borderline low She will continue taking oral vitamin B12 supplement  Orders Placed This Encounter  Procedures   Ferritin    Standing Status:   Future    Expiration Date:   01/25/2025   Iron  and Iron  Binding Capacity ([facility] only)    Standing Status:   Future    Expiration Date:   01/25/2025     Hicks Lonn, MD  INTERVAL HISTORY: she returns for treatment follow-up Complications related to previous cycle of chemotherapy included anemia, She denies side effects such as nausea, vaginal discharge She has intermittent rectal  bleeding that comes and goes  PHYSICAL EXAMINATION: ECOG PERFORMANCE STATUS: 1 - Symptomatic but completely ambulatory  No results found for: CAN125    Latest Ref Rng & Units 01/26/2024    9:01 AM 12/15/2023   11:20 AM 11/03/2023   12:27 PM  CBC  WBC 4.0 - 10.5 K/uL 4.4  2.7  2.1   Hemoglobin 12.0 - 15.0 g/dL 89.2  88.5  88.8   Hematocrit 36.0 - 46.0 % 31.6  34.1  32.4   Platelets 150 - 400 K/uL 297  279  249       Chemistry      Component Value Date/Time   NA 139 12/15/2023 1120   K 3.9 12/15/2023 1120   CL 106 12/15/2023 1120   CO2 28 12/15/2023 1120   BUN 18 12/15/2023 1120   CREATININE 1.01 (H) 12/15/2023 1120      Component Value Date/Time   CALCIUM 9.3 12/15/2023 1120   ALKPHOS 87 12/15/2023 1120   AST 27 12/15/2023 1120   ALT 28 12/15/2023 1120   BILITOT 0.3 12/15/2023 1120       There were no vitals filed for this visit. There were no vitals filed for this visit. Other relevant data reviewed during this visit included CBC, CMP, thyroid  function test

## 2024-01-26 NOTE — Assessment & Plan Note (Addendum)
 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, molecular testing show PD-L1 with CPS score of 1% Imaging study showed pelvic lymphadenopathy She had excellent response with concurrent chemoradiation therapy with cisplatin  and pembrolizumab  Radiation treatment completed by September 2024 and she continues on maintenance pembrolizumab   CT imaging from March 2024 showed no evidence of disease Overall, she has excellent response to therapy We will continue maintenance pembrolizumab  until end of August Her next imaging will be in September

## 2024-01-26 NOTE — Assessment & Plan Note (Addendum)
 Her recent vitamin B12 level was borderline low She will continue taking oral vitamin B12 supplement

## 2024-01-26 NOTE — Telephone Encounter (Signed)
 Pt given message per MD. She verbalized agreement and understanding.

## 2024-01-27 LAB — T4: T4, Total: 7.4 ug/dL (ref 4.5–12.0)

## 2024-02-06 ENCOUNTER — Encounter: Payer: Self-pay | Admitting: Hematology and Oncology

## 2024-02-06 ENCOUNTER — Encounter: Admitting: Gastroenterology

## 2024-03-08 ENCOUNTER — Encounter: Payer: Self-pay | Admitting: Hematology and Oncology

## 2024-03-08 ENCOUNTER — Ambulatory Visit: Admitting: Hematology and Oncology

## 2024-03-08 ENCOUNTER — Inpatient Hospital Stay: Attending: Hematology and Oncology

## 2024-03-08 ENCOUNTER — Ambulatory Visit

## 2024-03-08 ENCOUNTER — Inpatient Hospital Stay

## 2024-03-08 ENCOUNTER — Inpatient Hospital Stay (HOSPITAL_BASED_OUTPATIENT_CLINIC_OR_DEPARTMENT_OTHER): Admitting: Hematology and Oncology

## 2024-03-08 ENCOUNTER — Other Ambulatory Visit

## 2024-03-08 VITALS — BP 124/74 | HR 79 | Temp 97.8°F | Resp 18 | Ht 63.0 in | Wt 170.0 lb

## 2024-03-08 DIAGNOSIS — E538 Deficiency of other specified B group vitamins: Secondary | ICD-10-CM | POA: Insufficient documentation

## 2024-03-08 DIAGNOSIS — Z5112 Encounter for antineoplastic immunotherapy: Secondary | ICD-10-CM | POA: Diagnosis present

## 2024-03-08 DIAGNOSIS — E611 Iron deficiency: Secondary | ICD-10-CM | POA: Diagnosis not present

## 2024-03-08 DIAGNOSIS — D61818 Other pancytopenia: Secondary | ICD-10-CM

## 2024-03-08 DIAGNOSIS — C538 Malignant neoplasm of overlapping sites of cervix uteri: Secondary | ICD-10-CM | POA: Diagnosis present

## 2024-03-08 DIAGNOSIS — C531 Malignant neoplasm of exocervix: Secondary | ICD-10-CM

## 2024-03-08 DIAGNOSIS — Z923 Personal history of irradiation: Secondary | ICD-10-CM | POA: Diagnosis not present

## 2024-03-08 DIAGNOSIS — N939 Abnormal uterine and vaginal bleeding, unspecified: Secondary | ICD-10-CM

## 2024-03-08 DIAGNOSIS — D508 Other iron deficiency anemias: Secondary | ICD-10-CM

## 2024-03-08 LAB — CMP (CANCER CENTER ONLY)
ALT: 10 U/L (ref 0–44)
AST: 12 U/L — ABNORMAL LOW (ref 15–41)
Albumin: 3.8 g/dL (ref 3.5–5.0)
Alkaline Phosphatase: 79 U/L (ref 38–126)
Anion gap: 6 (ref 5–15)
BUN: 13 mg/dL (ref 6–20)
CO2: 28 mmol/L (ref 22–32)
Calcium: 8.8 mg/dL — ABNORMAL LOW (ref 8.9–10.3)
Chloride: 106 mmol/L (ref 98–111)
Creatinine: 0.97 mg/dL (ref 0.44–1.00)
GFR, Estimated: >60 mL/min (ref >60)
Glucose, Bld: 112 mg/dL — ABNORMAL HIGH (ref 70–99)
Potassium: 3.9 mmol/L (ref 3.5–5.1)
Sodium: 140 mmol/L (ref 135–145)
Total Bilirubin: 0.2 mg/dL (ref 0.0–1.2)
Total Protein: 6.4 g/dL — ABNORMAL LOW (ref 6.5–8.1)

## 2024-03-08 LAB — CBC WITH DIFFERENTIAL (CANCER CENTER ONLY)
Abs Immature Granulocytes: 0.01 K/uL (ref 0.00–0.07)
Basophils Absolute: 0 K/uL (ref 0.0–0.1)
Basophils Relative: 1 %
Eosinophils Absolute: 0.1 K/uL (ref 0.0–0.5)
Eosinophils Relative: 2 %
HCT: 29.1 % — ABNORMAL LOW (ref 36.0–46.0)
Hemoglobin: 9.5 g/dL — ABNORMAL LOW (ref 12.0–15.0)
Immature Granulocytes: 0 %
Lymphocytes Relative: 17 %
Lymphs Abs: 0.6 K/uL — ABNORMAL LOW (ref 0.7–4.0)
MCH: 30.5 pg (ref 26.0–34.0)
MCHC: 32.6 g/dL (ref 30.0–36.0)
MCV: 93.6 fL (ref 80.0–100.0)
Monocytes Absolute: 0.3 K/uL (ref 0.1–1.0)
Monocytes Relative: 9 %
Neutro Abs: 2.6 K/uL (ref 1.7–7.7)
Neutrophils Relative %: 71 %
Platelet Count: 303 K/uL (ref 150–400)
RBC: 3.11 MIL/uL — ABNORMAL LOW (ref 3.87–5.11)
RDW: 14.6 % (ref 11.5–15.5)
WBC Count: 3.6 K/uL — ABNORMAL LOW (ref 4.0–10.5)
nRBC: 0 % (ref 0.0–0.2)

## 2024-03-08 LAB — TSH: TSH: 0.903 u[IU]/mL (ref 0.350–4.500)

## 2024-03-08 MED ORDER — SODIUM CHLORIDE 0.9 % IV SOLN
400.0000 mg | Freq: Once | INTRAVENOUS | Status: AC
  Administered 2024-03-08: 400 mg via INTRAVENOUS
  Filled 2024-03-08: qty 16

## 2024-03-08 MED ORDER — SODIUM CHLORIDE 0.9% FLUSH: 10.0000 mL | INTRAVENOUS | Status: DC | PRN

## 2024-03-08 MED ORDER — SODIUM CHLORIDE 0.9 % IV SOLN: Freq: Once | INTRAVENOUS | Status: AC

## 2024-03-08 MED ORDER — SODIUM CHLORIDE 0.9% FLUSH
10.0000 mL | Freq: Once | INTRAVENOUS | Status: AC
  Administered 2024-03-08: 10 mL

## 2024-03-08 NOTE — Progress Notes (Signed)
 [facility] OFFICE PROGRESS NOTE  Patient Care Team: Pcp, No as PCP - General Lonn Hicks, MD as Consulting Physician (Hematology and Oncology) [name], MD as Consulting Physician (Gynecologic Oncology) Sheldon Standing, MD as Consulting Physician (General Surgery) Armbruster, Standing SQUIBB, MD as Consulting Physician (Gastroenterology) Shannon Agent, MD as Consulting Physician (Radiation Oncology)  Assessment & Plan Malignant neoplasm of overlapping sites of cervix [facility]) 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, molecular testing show PD-L1 with CPS score of 1% Imaging study showed pelvic lymphadenopathy She had excellent response with concurrent chemoradiation therapy with cisplatin  and pembrolizumab  Radiation treatment completed by September 2024 and she continues on maintenance pembrolizumab   CT imaging from March 2024 showed no evidence of disease Overall, she has excellent response to therapy She will complete last cycle of treatment today I plan to repeat imaging study next month for further follow-up Pancytopenia, acquired (HCC) She has combined iron  deficiency and vitamin B12 deficiency I recommend both oral iron  and vitamin B12 supplement  Orders Placed This Encounter  Procedures   CT ABDOMEN PELVIS W CONTRAST    Standing Status:   Future    Expected Date:   04/09/2024    Expiration Date:   03/08/2025    Scheduling Instructions:     No oral contrast    If indicated for the ordered procedure, I authorize the administration of contrast media per Radiology protocol:   Yes    Does the patient have a contrast media/X-ray dye allergy?:   No    Is patient pregnant?:   No    Preferred imaging location?:   [facility]    If indicated for the ordered procedure, I authorize the administration of  oral contrast media per Radiology protocol:   No    Reason for no oral contrast::   No need oral contrast   CBC with Differential/Platelet    Standing Status:   Standing    Number of Occurrences:   22    Expiration Date:   03/08/2025   Comprehensive metabolic panel with GFR    Standing Status:   Standing    Number of Occurrences:   33    Expiration Date:   03/08/2025   TSH    Standing Status:   Standing    Number of Occurrences:   22    Expiration Date:   03/08/2025     Hicks Lonn, MD  INTERVAL HISTORY: she returns for treatment follow-up Complications related to previous cycle of chemotherapy included pancytopenia, Overall, she tolerated treatment well with no side effects  PHYSICAL EXAMINATION: ECOG PERFORMANCE STATUS: 0 - Asymptomatic  No results found for: CAN125    Latest Ref Rng & Units 03/08/2024    9:52 AM 01/26/2024    9:01 AM 12/15/2023   11:20 AM  CBC  WBC 4.0 - 10.5 K/uL 3.6  4.4  2.7   Hemoglobin 12.0 - 15.0 g/dL 9.5  89.2  88.5   Hematocrit 36.0 - 46.0 % 29.1  31.6  34.1   Platelets 150 - 400 K/uL 303  297  279       Chemistry      Component Value Date/Time   NA 140 03/08/2024 0952   K 3.9 03/08/2024 0952   CL 106 03/08/2024 0952   CO2 28 03/08/2024 0952   BUN 13 03/08/2024 0952   CREATININE 0.97  03/08/2024 0952      Component Value Date/Time   CALCIUM 8.8 (L) 03/08/2024 0952   ALKPHOS 79 03/08/2024 0952   AST 12 (L) 03/08/2024 0952   ALT 10 03/08/2024 0952   BILITOT 0.2 03/08/2024 0952       Vitals:   03/08/24 1023  BP: 124/74  Pulse: 79  Resp: 18  Temp: 97.8 F (36.6 C)  SpO2: 100%   Filed Weights   03/08/24 1023  Weight: 170 lb (77.1 kg)   Other relevant data reviewed during this visit included CBC and CMP, TSH

## 2024-03-08 NOTE — Assessment & Plan Note (Addendum)
 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

## 2024-03-08 NOTE — Patient Instructions (Signed)
 [facility] - A DEPT OF [facility]  Discharge Instructions: Thank you for choosing [facility] to provide your oncology and hematology care.   If you have a lab appointment with the Cancer Center, please go directly to the Cancer Center and check in at the registration area.   Wear comfortable clothing and clothing appropriate for easy access to any Portacath or PICC line.   We strive to give you quality time with your provider. You may need to reschedule your appointment if you arrive late (15 or more minutes).  Arriving late affects you and other patients whose appointments are after yours.  Also, if you miss three or more appointments without notifying the office, you may be dismissed from the clinic at the provider's discretion.      For prescription refill requests, have your pharmacy contact our office and allow 72 hours for refills to be completed.    Today you received the following chemotherapy and/or immunotherapy agents keytruda       To help prevent nausea and vomiting after your treatment, we encourage you to take your nausea medication as directed.  BELOW ARE SYMPTOMS THAT SHOULD BE REPORTED IMMEDIATELY: *FEVER GREATER THAN 100.4 F (38 C) OR HIGHER *CHILLS OR SWEATING *NAUSEA AND VOMITING THAT IS NOT CONTROLLED WITH YOUR NAUSEA MEDICATION *UNUSUAL SHORTNESS OF BREATH *UNUSUAL BRUISING OR BLEEDING *URINARY PROBLEMS (pain or burning when urinating, or frequent urination) *BOWEL PROBLEMS (unusual diarrhea, constipation, pain near the anus) TENDERNESS IN MOUTH AND THROAT WITH OR WITHOUT PRESENCE OF ULCERS (sore throat, sores in mouth, or a toothache) UNUSUAL RASH, SWELLING OR PAIN  UNUSUAL VAGINAL DISCHARGE OR ITCHING   Items with * indicate a potential emergency and should be followed up as soon as possible or go to the Emergency Department if any problems should occur.  Please show the CHEMOTHERAPY ALERT CARD or IMMUNOTHERAPY  ALERT CARD at check-in to the Emergency Department and triage nurse.  Should you have questions after your visit or need to cancel or reschedule your appointment, please contact [facility] - A DEPT OF [facility]  Dept: [phone]  and follow the prompts.  Office hours are 8:00 a.m. to 4:30 p.m. Monday - Friday. Please note that voicemails left after 4:00 p.m. may not be returned until the following business day.  We are closed weekends and major holidays. You have access to a nurse at all times for urgent questions. Please call the main number to the clinic Dept: [phone] and follow the prompts.   For any non-urgent questions, you may also contact your provider using MyChart. We now offer e-Visits for anyone 47 and older to request care online for non-urgent symptoms. For details visit [email or web address].   Also download the MyChart app! Go to the app store, search MyChart, open the app, select [place], and log in with your MyChart username and password.

## 2024-03-08 NOTE — Assessment & Plan Note (Addendum)
 She has combined iron  deficiency and vitamin B12 deficiency I recommend both oral iron  and vitamin B12 supplement

## 2024-03-09 LAB — T4: T4, Total: 5.7 ug/dL (ref 4.5–12.0)

## 2024-03-16 NOTE — Progress Notes (Deleted)
 53 year old female here for hemorrhoid banding.  Colonoscopy performed in December showing grade 3 hemorrhoids.  She has had problems with prolapse and bleeding for some time.  Denies any hard stools but does strain when using the bathroom at times.  Symptoms have been longstanding.  Recall she also has a history of cervical cancer status post XRT last year and currently on Keytruda  infusions.  She had no radiation changes in her colon on her colonoscopy.   We banded the left lateral hemorrhoid in March for her first treatment.  She states she had some moderate discomfort for the first 36 to 48 hours and then it resolved completely.  She thinks she has had some mild improvement in her overall hemorrhoid symptoms since the first band but she still has some prolapse that bothers her.  We discussed her options including surgery, she wishes to proceed with additional banding to see if more time with this will help rather than pursue operative intervention at this point in time    Colonoscopy 06/21/23: - Hemorrhoids were found on perianal exam (grade III - reduced on exam). - The terminal ileum appeared normal. - Many diverticula were found in the entire colon. - Internal hemorrhoids were found during retroflexion. - The exam was otherwise without abnormality. The appendiceal orifice is normal.      Appendectomy 08/17/23     PROCEDURE NOTE: The patient presents with symptomatic grade III  hemorrhoids, requesting rubber band ligation of his/her hemorrhoidal disease.  All risks, benefits and alternative forms of therapy were described and informed consent was obtained.     The anorectum was pre-medicated with 0.125% nitroglycerin The decision was made to band the RP internal hemorrhoid, and the [facility] System was used to perform band ligation without complication.  Digital anorectal examination was then performed to assure proper positioning of the band, and to adjust the banded tissue as required.   The patient was discharged home without pain or other issues.  Dietary and behavioral recommendations were given and along with follow-up instructions.     The following adjunctive treatments were recommended: Fiber supplement daily   The patient will return in 2-4 weeks for  follow-up and possible additional banding as required. No complications were encountered and the patient tolerated the procedure well.   Marcey Naval, MD [facility] Gastroenterology

## 2024-03-19 ENCOUNTER — Encounter: Admitting: Gastroenterology

## 2024-04-06 NOTE — Progress Notes (Signed)
 Radiation Oncology         [phone] ________________________________  Name: [facility] MRN: [id number]  Date: 04/09/2024  DOB: [birth year]  Follow-Up Visit Note  CC: Pcp, No  No ref. provider found    ICD-10-CM   1. Malignant neoplasm of overlapping sites of cervix [facility])  C53.8 Cytology - PAP      Diagnosis: Stage IIIc, squamous cell carcinoma of the cervix; s/p concurrent chemoradiation followed by brachytherapy completed on 04/07/2023 currently on maintenance pembrolizumab     Cancer Staging  Cervical cancer [facility]) Staging form: Cervix Uteri, AJCC Version 9 - Clinical stage from 12/02/2022: Stage IIIC1 (cT1b3, cN1, cM0) - Signed by Lonn Hicks, MD on 12/15/2022  Interval Since Last Radiation: 1 year and 3 days   Indication for treatment: Curative         Radiation treatment dates: 01/20/23 through 04/07/23 ( Pelvic IMRT : 01/20/23 through 03/03/23)  ( Brachytherapy :5 treatment sessions delivered from 03/10/23 through 04/07/23)   Site/dose:   1) Pelvic IMRT - 45 Gy delivered in 25 Fx at 1.8 Gy/Fx  2) Pelvic boost - 9 Gy delivered in 5 Fx at 1.8 Gy/Fx 3) Cervical brachytherapy - 27.5 Gy delivered in 5 Fx at 5.5 Gy/Fx Technique/Mode:  1) IMRT / Photon  2) 3D / Photon  3) HDR Ir-192 / Brachytherapy  Narrative:  The patient returns today for routine follow-up. She was last seen here for follow-up on 10/10/23 and has continued to follow with medical oncology and gyn-onc since that time.  She has tolerated the remainder of her maintenance therapy well per her recent visits with Dr. Lonn. She received her last dose of maintenance therapy on 03/08/24.   During a routine follow-up visit with Dr. Rogelio on 10/18/23, she was noted to be doing well other than some ongoing issues with decreasing libido secondary to chemotherapy. Dr. Rogelio has counseled her on ways to address this and recommended the addition of wellbutrin  to increase her libido.  She was also seen  by Dr. Viktoria on 01/20/24 for a routine follow-up visit. She was doing well and NED on examination at that time.   No other significant interval history since the patient was last seen for follow-up.   Of note: She is due for a follow-up CT AP which is scheduled for today at 12:30 PM.  On evaluation today she denies any pain within the pelvis area hematuria or rectal bleeding.  She denies any further soreness since her hemorrhoidal banding.  She denies any postcoital bleeding or pain with intercourse.                             Allergies:  is allergic to demerol  [meperidine  hcl] and hydromorphone .  Meds: Current Outpatient Medications  Medication Sig Dispense Refill   acetaminophen  (TYLENOL ) 500 MG tablet Take 2 tablets (1,000 mg total) by mouth every 6 (six) hours as needed for mild pain (pain score 1-3).     buPROPion  (WELLBUTRIN  SR) 150 MG 12 hr tablet Take 1 tablet (150 mg total) by mouth 2 (two) times daily. 60 tablet 3   cyanocobalamin  (VITAMIN B12) 1000 MCG tablet Take 1,000 mcg by mouth daily.     estradiol  (ESTRACE  VAGINAL) 0.1 MG/GM vaginal cream Place 1 Applicatorful vaginally at bedtime. 42.5 g 12   ferrous sulfate 325 (65 FE) MG EC tablet Take 325 mg by mouth daily with breakfast.     Fiber Gummies 2 g  CHEW Chew 1 each by mouth daily.     No current facility-administered medications for this encounter.    Physical Findings: The patient is in no acute distress. Patient is alert and oriented.  height is 5' 3 (1.6 m) and weight is 171 lb 9.6 oz (77.8 kg). Her temperature is 97.6 F (36.4 C). Her blood pressure is 122/78 and her pulse is 73. Her respiration is 18 and oxygen saturation is 100%. .  No significant changes. Lungs are clear to auscultation bilaterally. Heart has regular rate and rhythm. No palpable cervical, supraclavicular, or axillary adenopathy. Abdomen soft, non-tender, normal bowel sounds.  On pelvic examination the external genitalia were unremarkable without  lesions or excoriation.  She continues to have anxiety with pelvic exams and tenses up.  A speculum exam is performed.  No mucosal lesions noted in the vaginal vault.  Some radiation changes noted in the region of the cervix.  I was unable to identify the cervical os on speculum exam.  A Pap smear was obtained of the cervix region.  Some mild bleeding was noted with the Pap smear.  On bimanual examination the cervix palpates as smooth, cervical os pointing posteriorly, without nodularity, no significant enlargement.  On rectovaginal examination no obvious parametrial extension is noted.   Lab Findings: Lab Results  Component Value Date   WBC 2.6 (L) 04/09/2024   HGB 9.8 (L) 04/09/2024   HCT 30.1 (L) 04/09/2024   MCV 91.5 04/09/2024   PLT 283 04/09/2024    Radiographic Findings: No results found.  Impression: Stage IIIc, squamous cell carcinoma of the cervix; s/p concurrent chemoradiation followed by brachytherapy completed on 04/07/2023 currently on maintenance pembrolizumab    No evidence of recurrence on clinical exam today, Pap smear pending.  Plan: Routine follow-up in 6 months.  She is scheduled to see Dr. Lonn in October to follow-up on blood work and CT scans.  She will follow-up with Dr. Viktoria in 3 months.   30 minutes of total time was spent for this patient encounter, including preparation, face-to-face counseling with the patient and coordination of care, physical exam, and documentation of the encounter. ____________________________________  Lynwood CHARM Nasuti, PhD, MD  This document serves as a record of services personally performed by Lynwood Nasuti, MD. It was created on his behalf by Dorthy Fuse, a trained medical scribe. The creation of this record is based on the scribe's personal observations and the provider's statements to them. This document has been checked and approved by the attending provider.

## 2024-04-09 ENCOUNTER — Ambulatory Visit

## 2024-04-09 ENCOUNTER — Ambulatory Visit (HOSPITAL_COMMUNITY)
Admission: RE | Admit: 2024-04-09 | Discharge: 2024-04-09 | Disposition: A | Discharge status: Home / Self Care | Source: Ambulatory Visit | Attending: Hematology and Oncology | Admitting: Hematology and Oncology

## 2024-04-09 ENCOUNTER — Ambulatory Visit
Admission: RE | Admit: 2024-04-09 | Discharge: 2024-04-09 | Disposition: A | Discharge status: Home / Self Care | Source: Ambulatory Visit | Attending: Radiation Oncology | Admitting: Radiation Oncology

## 2024-04-09 ENCOUNTER — Inpatient Hospital Stay: Attending: Hematology and Oncology

## 2024-04-09 VITALS — BP 122/78 | HR 73 | Temp 97.6°F | Resp 18 | Ht 63.0 in | Wt 171.6 lb

## 2024-04-09 DIAGNOSIS — C538 Malignant neoplasm of overlapping sites of cervix uteri: Secondary | ICD-10-CM | POA: Insufficient documentation

## 2024-04-09 DIAGNOSIS — Z923 Personal history of irradiation: Secondary | ICD-10-CM | POA: Insufficient documentation

## 2024-04-09 DIAGNOSIS — Z9221 Personal history of antineoplastic chemotherapy: Secondary | ICD-10-CM | POA: Insufficient documentation

## 2024-04-09 DIAGNOSIS — Z79899 Other long term (current) drug therapy: Secondary | ICD-10-CM | POA: Insufficient documentation

## 2024-04-09 LAB — COMPREHENSIVE METABOLIC PANEL WITH GFR
ALT: 7 U/L (ref 0–44)
AST: 13 U/L — ABNORMAL LOW (ref 15–41)
Albumin: 3.9 g/dL (ref 3.5–5.0)
Alkaline Phosphatase: 84 U/L (ref 38–126)
Anion gap: 3 — ABNORMAL LOW (ref 5–15)
BUN: 22 mg/dL — ABNORMAL HIGH (ref 6–20)
CO2: 29 mmol/L (ref 22–32)
Calcium: 8.8 mg/dL — ABNORMAL LOW (ref 8.9–10.3)
Chloride: 109 mmol/L (ref 98–111)
Creatinine, Ser: 0.93 mg/dL (ref 0.44–1.00)
GFR, Estimated: >60 mL/min (ref >60)
Glucose, Bld: 105 mg/dL — ABNORMAL HIGH (ref 70–99)
Potassium: 3.9 mmol/L (ref 3.5–5.1)
Sodium: 141 mmol/L (ref 135–145)
Total Bilirubin: 0.2 mg/dL (ref 0.0–1.2)
Total Protein: 6.6 g/dL (ref 6.5–8.1)

## 2024-04-09 LAB — CBC WITH DIFFERENTIAL/PLATELET
Abs Immature Granulocytes: 0 K/uL (ref 0.00–0.07)
Basophils Absolute: 0 K/uL (ref 0.0–0.1)
Basophils Relative: 0 %
Eosinophils Absolute: 0.1 K/uL (ref 0.0–0.5)
Eosinophils Relative: 2 %
HCT: 30.1 % — ABNORMAL LOW (ref 36.0–46.0)
Hemoglobin: 9.8 g/dL — ABNORMAL LOW (ref 12.0–15.0)
Immature Granulocytes: 0 %
Lymphocytes Relative: 25 %
Lymphs Abs: 0.6 K/uL — ABNORMAL LOW (ref 0.7–4.0)
MCH: 29.8 pg (ref 26.0–34.0)
MCHC: 32.6 g/dL (ref 30.0–36.0)
MCV: 91.5 fL (ref 80.0–100.0)
Monocytes Absolute: 0.3 K/uL (ref 0.1–1.0)
Monocytes Relative: 11 %
Neutro Abs: 1.6 K/uL — ABNORMAL LOW (ref 1.7–7.7)
Neutrophils Relative %: 62 %
Platelets: 283 K/uL (ref 150–400)
RBC: 3.29 MIL/uL — ABNORMAL LOW (ref 3.87–5.11)
RDW: 14.6 % (ref 11.5–15.5)
WBC: 2.6 K/uL — ABNORMAL LOW (ref 4.0–10.5)
nRBC: 0 % (ref 0.0–0.2)

## 2024-04-09 LAB — TSH: TSH: 0.773 u[IU]/mL (ref 0.350–4.500)

## 2024-04-09 MED ORDER — HEPARIN SOD (PORK) LOCK FLUSH 100 UNIT/ML IV SOLN
500.0000 [IU] | Freq: Once | INTRAVENOUS | Status: AC
  Administered 2024-04-09: 500 [IU] via INTRAVENOUS

## 2024-04-09 MED ORDER — IOHEXOL 300 MG/ML  SOLN
100.0000 mL | Freq: Once | INTRAMUSCULAR | Status: AC | PRN
  Administered 2024-04-09: 100 mL via INTRAVENOUS

## 2024-04-09 NOTE — Progress Notes (Signed)
 [facility] is here today for follow up post radiation to the pelvic.  They completed their radiation on: 04/07/23   Does the patient complain of any of the following:  Pain:No pain Abdominal bloating: None Diarrhea/Constipation: None Nausea/Vomiting: None Vaginal Discharge: none Blood in Urine or Stool: None Urinary Issues (dysuria/incomplete emptying/ incontinence/ increased frequency/urgency): None Does patient report using vaginal dilator 2-3 times a week and/or sexually active 2-3 weeks: Sexually active Post radiation skin changes: None   Additional comments if applicable: None  BP 122/78 (BP Location: Right Arm, Patient Position: Sitting, Cuff Size: Large)   Pulse 73   Temp 97.6 F (36.4 C)   Resp 18   Ht 5' 3 (1.6 m)   Wt 171 lb 9.6 oz (77.8 kg)   LMP 06/11/2022   SpO2 100%   BMI 30.40 kg/m    Wt Readings from Last 3 Encounters:  04/09/24 171 lb 9.6 oz (77.8 kg)  03/08/24 170 lb (77.1 kg)  01/26/24 163 lb (73.9 kg)

## 2024-04-11 ENCOUNTER — Encounter: Payer: Self-pay | Admitting: Hematology and Oncology

## 2024-04-12 LAB — CYTOLOGY - PAP
Comment: NEGATIVE
Diagnosis: NEGATIVE
Diagnosis: REACTIVE
High risk HPV: NEGATIVE

## 2024-04-16 ENCOUNTER — Inpatient Hospital Stay: Attending: Hematology and Oncology | Admitting: Hematology and Oncology

## 2024-04-16 ENCOUNTER — Encounter: Payer: Self-pay | Admitting: Hematology and Oncology

## 2024-04-16 VITALS — BP 129/83 | HR 80 | Temp 98.1°F | Resp 18 | Ht 63.0 in | Wt 167.4 lb

## 2024-04-16 DIAGNOSIS — Z9221 Personal history of antineoplastic chemotherapy: Secondary | ICD-10-CM | POA: Insufficient documentation

## 2024-04-16 DIAGNOSIS — C538 Malignant neoplasm of overlapping sites of cervix uteri: Secondary | ICD-10-CM | POA: Insufficient documentation

## 2024-04-16 DIAGNOSIS — Z923 Personal history of irradiation: Secondary | ICD-10-CM | POA: Insufficient documentation

## 2024-04-16 NOTE — Assessment & Plan Note (Addendum)
 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, molecular testing show PD-L1 with CPS score of 1% Imaging study showed pelvic lymphadenopathy She had excellent response with concurrent chemoradiation therapy with cisplatin  and pembrolizumab  Radiation treatment completed by September 2024 and she continues on maintenance pembrolizumab   CT imaging from March 2024 showed no evidence of disease Overall, she has excellent response to therapy She completed last dose of immunotherapy in August 2025 I reviewed CT imaging from September 2025 which showed no evidence of active disease We discussed future follow-up and port maintenance I plan to repeat imaging study again next year in May

## 2024-04-16 NOTE — Progress Notes (Signed)
 [facility] OFFICE PROGRESS NOTE  Patient Care Team: Pcp, No as PCP - General Lonn Hicks, MD as Consulting Physician (Hematology and Oncology) [name], MD as Consulting Physician (Gynecologic Oncology) Sheldon Standing, MD as Consulting Physician (General Surgery) Armbruster, Standing SQUIBB, MD as Consulting Physician (Gastroenterology) Shannon Agent, MD as Consulting Physician (Radiation Oncology)  Assessment & Plan Malignant neoplasm of overlapping sites of cervix [facility]) 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, molecular testing show PD-L1 with CPS score of 1% Imaging study showed pelvic lymphadenopathy She had excellent response with concurrent chemoradiation therapy with cisplatin  and pembrolizumab  Radiation treatment completed by September 2024 and she continues on maintenance pembrolizumab   CT imaging from March 2024 showed no evidence of disease Overall, she has excellent response to therapy She completed last dose of immunotherapy in August 2025 I reviewed CT imaging from September 2025 which showed no evidence of active disease We discussed future follow-up and port maintenance I plan to repeat imaging study again next year in May  Orders Placed This Encounter  Procedures   CT ABDOMEN PELVIS W CONTRAST    Standing Status:   Future    Expected Date:   10/15/2024    Expiration Date:   04/16/2025    If indicated for the ordered procedure, I authorize the administration of contrast media per Radiology protocol:   Yes    Does the patient have a contrast media/X-ray dye allergy?:   No    If indicated for the ordered procedure, I authorize the administration of oral contrast media per Radiology protocol:   No    Reason for no oral contrast::   no need oral contrast    Preferred imaging location?:   [facility]     Hicks Lonn, MD  INTERVAL HISTORY: she returns for surveillance follow-up for cervical cancer, completed treatment She is doing well Denies vaginal bleeding or abnormal vaginal discharge I reviewed blood work and imaging studies with the patient  PHYSICAL EXAMINATION: ECOG PERFORMANCE STATUS: 1 - Symptomatic but completely ambulatory  Vitals:   04/16/24 1455  BP: 129/83  Pulse: 80  Resp: 18  Temp: 98.1 F (36.7 C)  SpO2: 100%   Filed Weights   04/16/24 1455  Weight: 167 lb 6.4 oz (75.9 kg)    Relevant data reviewed during this visit included CT imaging from September 2025 in May 2024

## 2024-05-01 ENCOUNTER — Encounter: Payer: Self-pay | Admitting: Hematology and Oncology

## 2024-06-04 ENCOUNTER — Inpatient Hospital Stay: Attending: Hematology and Oncology

## 2024-06-26 ENCOUNTER — Inpatient Hospital Stay: Attending: Hematology and Oncology

## 2024-06-26 DIAGNOSIS — Z452 Encounter for adjustment and management of vascular access device: Secondary | ICD-10-CM | POA: Diagnosis present

## 2024-06-26 DIAGNOSIS — C538 Malignant neoplasm of overlapping sites of cervix uteri: Secondary | ICD-10-CM | POA: Insufficient documentation

## 2024-07-08 NOTE — Progress Notes (Incomplete)
 "  Radiation Oncology         ([phone]) [phone] ________________________________  Name: Lorraine Yates MRN: [id number]  Date: 07/09/2024  DOB: 1970-10-21  Follow-Up Visit Note  CC: Pcp, No  Lorraine Comer SAUNDERS, MD  No diagnosis found.  Diagnosis: Stage IIIc, squamous cell carcinoma of the cervix; s/p concurrent chemoradiation followed by brachytherapy completed on 04/07/2023 currently on maintenance pembrolizumab     Cancer Staging  Cervical cancer [facility]) Staging form: Cervix Uteri, AJCC Version 9 - Clinical stage from 12/02/2022: Stage IIIC1 (cT1b3, cN1, cM0) - Signed by Lonn Hicks, MD on 12/15/2022  Interval Since Last Radiation: 1 year, 3 months, and 3 days   Indication for treatment: Curative         Radiation treatment dates: 01/20/23 through 04/07/23 ( Pelvic IMRT : 01/20/23 through 03/03/23)  ( Brachytherapy :5 treatment sessions delivered from 03/10/23 through 04/07/23)   Site/dose:   1) Pelvic IMRT - 45 Gy delivered in 25 Fx at 1.8 Gy/Fx  2) Pelvic boost - 9 Gy delivered in 5 Fx at 1.8 Gy/Fx 3) Cervical brachytherapy - 27.5 Gy delivered in 5 Fx at 5.5 Gy/Fx Technique/Mode:  1) IMRT / Photon  2) 3D / Photon  3) HDR Ir-192 / Brachytherapy  Narrative:  The patient returns today for routine 3 month follow-up visit. She was last seen here for follow-up on 04/09/24.         On the date of her last visit, she presented for a restaging CT AP with contrast which demonstrated no evidence of recurrent or metastatic carcinoma in the abdomen or pelvis which is great to see.    Dr. Lonn reviewed these findings with the patient on 04/16/24 and recommends that she return for repeat imaging in May of 2026.   No other significant interval history since the patient was last seen for follow-up.   ***                       Allergies:  is allergic to demerol  [meperidine  hcl] and hydromorphone .  Meds: Current Outpatient Medications  Medication Sig Dispense Refill   acetaminophen  (TYLENOL )  500 MG tablet Take 2 tablets (1,000 mg total) by mouth every 6 (six) hours as needed for mild pain (pain score 1-3).     buPROPion  (WELLBUTRIN  SR) 150 MG 12 hr tablet Take 1 tablet (150 mg total) by mouth 2 (two) times daily. 60 tablet 3   cyanocobalamin  (VITAMIN B12) 1000 MCG tablet Take 1,000 mcg by mouth daily.     estradiol  (ESTRACE  VAGINAL) 0.1 MG/GM vaginal cream Place 1 Applicatorful vaginally at bedtime. 42.5 g 12   ferrous sulfate 325 (65 FE) MG EC tablet Take 325 mg by mouth daily with breakfast.     Fiber Gummies 2 g CHEW Chew 1 each by mouth daily.     No current facility-administered medications for this encounter.    Physical Findings: The patient is in no acute distress. Patient is alert and oriented.  vitals were not taken for this visit. .  No significant changes. Lungs are clear to auscultation bilaterally. Heart has regular rate and rhythm. No palpable cervical, supraclavicular, or axillary adenopathy. Abdomen soft, non-tender, normal bowel sounds.  On pelvic examination the external genitalia were unremarkable. A speculum exam was performed. There are no mucosal lesions noted in the vaginal vault. A Pap smear was obtained of the proximal vagina. On bimanual and rectovaginal examination there were no pelvic masses appreciated. ***   Lab  Findings: Lab Results  Component Value Date   WBC 2.6 (L) 04/09/2024   HGB 9.8 (L) 04/09/2024   HCT 30.1 (L) 04/09/2024   MCV 91.5 04/09/2024   PLT 283 04/09/2024    Radiographic Findings: No results found.  Impression: Stage IIIc, squamous cell carcinoma of the cervix; s/p concurrent chemoradiation followed by brachytherapy completed on 04/07/2023 currently on maintenance pembrolizumab    The patient is recovering from the effects of radiation.  ***  Plan:  ***   *** minutes of total time was spent for this patient encounter, including preparation, face-to-face counseling with the patient and coordination of care, physical exam,  and documentation of the encounter. ____________________________________  Lynwood CHARM Nasuti, PhD, MD  This document serves as a record of services personally performed by Lynwood Nasuti, MD. It was created on his behalf by Dorthy Fuse, a trained medical scribe. The creation of this record is based on the scribe's personal observations and the provider's statements to them. This document has been checked and approved by the attending provider.  "

## 2024-07-09 ENCOUNTER — Ambulatory Visit
Admission: RE | Admit: 2024-07-09 | Discharge: 2024-07-09 | Disposition: A | Discharge status: Home / Self Care | Payer: Self-pay | Source: Ambulatory Visit | Attending: Radiation Oncology | Admitting: Radiation Oncology

## 2024-07-26 NOTE — Progress Notes (Incomplete)
 Lorraine Yates is here today for follow up post radiation to the cervix.   They completed their radiation on: 04/07/23   Does the patient complain of any of the following:  Pain:*** Abdominal bloating: *** Diarrhea/Constipation: *** Nausea/Vomiting: *** Vaginal Discharge: *** Blood in Urine or Stool: *** Urinary Issues (dysuria/incomplete emptying/ incontinence/ increased frequency/urgency): *** Does patient report using vaginal dilator 2-3 times a week and/or sexually active 2-3 weeks: *** Post radiation skin changes: ***   Additional comments if applicable:  Last pap 04/09/24

## 2024-07-26 NOTE — Progress Notes (Incomplete)
 "  Radiation Oncology         [phone] ________________________________  Name: Lorraine Yates MRN: [id number]  Date: 07/30/2024  DOB: 05-02-1971  Follow-Up Visit Note  CC: Pcp, No  No ref. provider found  No diagnosis found. ***  Diagnosis: Stage IIIc, squamous cell carcinoma of the cervix; s/p concurrent chemoradiation followed by brachytherapy completed on 04/07/2023    Cancer Staging  Cervical cancer [facility]) Staging form: Cervix Uteri, AJCC Version 9 - Clinical stage from 12/02/2022: Stage IIIC1 (cT1b3, cN1, cM0) - Signed by Lonn Hicks, MD on 12/15/2022  Interval Since Last Radiation: approximately 1 year and 4 months  Indication for treatment: Curative         Radiation treatment dates: 01/20/23 through 04/07/23 ( Pelvic IMRT : 01/20/23 through 03/03/23)  ( Brachytherapy :5 treatment sessions delivered from 03/10/23 through 04/07/23)   Site/dose:   1) Pelvic IMRT - 45 Gy delivered in 25 Fx at 1.8 Gy/Fx  2) Pelvic boost - 9 Gy delivered in 5 Fx at 1.8 Gy/Fx 3) Cervical brachytherapy - 27.5 Gy delivered in 5 Fx at 5.5 Gy/Fx Technique/Mode:  1) IMRT / Photon  2) 3D / Photon  3) HDR Ir-192 / Brachytherapy  Narrative:  The patient returns today for routine 3 month follow-up visit. She was last seen here for follow-up on 04/09/24.         On the date of her last visit, she presented for a restaging CT AP with contrast which demonstrated no evidence of recurrent or metastatic carcinoma in the abdomen or pelvis which is great to see.    Dr. Lonn reviewed these findings with the patient on 04/16/24 and recommends that she return for repeat imaging in May of 2026.   No other significant interval history since the patient was last seen for follow-up.   ***                       Allergies:  is allergic to demerol  [meperidine  hcl] and hydromorphone .  Meds: Current Outpatient Medications  Medication Sig Dispense Refill   acetaminophen  (TYLENOL ) 500 MG tablet Take 2 tablets  (1,000 mg total) by mouth every 6 (six) hours as needed for mild pain (pain score 1-3).     buPROPion  (WELLBUTRIN  SR) 150 MG 12 hr tablet Take 1 tablet (150 mg total) by mouth 2 (two) times daily. 60 tablet 3   cyanocobalamin  (VITAMIN B12) 1000 MCG tablet Take 1,000 mcg by mouth daily.     estradiol  (ESTRACE  VAGINAL) 0.1 MG/GM vaginal cream Place 1 Applicatorful vaginally at bedtime. 42.5 g 12   ferrous sulfate 325 (65 FE) MG EC tablet Take 325 mg by mouth daily with breakfast.     Fiber Gummies 2 g CHEW Chew 1 each by mouth daily.     No current facility-administered medications for this visit.    Physical Findings: The patient is in no acute distress. Patient is alert and oriented.  vitals were not taken for this visit. .  No significant changes. Lungs are clear to auscultation bilaterally. Heart has regular rate and rhythm. No palpable cervical, supraclavicular, or axillary adenopathy. Abdomen soft, non-tender, normal bowel sounds.  On pelvic examination the external genitalia were unremarkable. A speculum exam was performed. There are no mucosal lesions noted in the vaginal vault. A Pap smear was obtained of the proximal vagina. On bimanual and rectovaginal examination there were no pelvic masses appreciated. ***   Lab Findings: Lab Results  Component  Value Date   WBC 2.6 (L) 04/09/2024   HGB 9.8 (L) 04/09/2024   HCT 30.1 (L) 04/09/2024   MCV 91.5 04/09/2024   PLT 283 04/09/2024    Radiographic Findings: No results found.  Impression: Stage IIIc, squamous cell carcinoma of the cervix; s/p concurrent chemoradiation followed by brachytherapy completed on 04/07/2023   Last PAP on 04/09/2024 was negative for HPV and malignancy.  No evidence of disease recurrence on clinical exam today.  ***  Plan:  Per NCCN guidelines, patient will follow up with Dr. Viktoria in 3 months and radiation oncology in 6 months. Patient was educated on signs/symptoms that may indicate disease recurrence  and understands to notify us  if she experiences any of them.     *** minutes of total time was spent for this patient encounter, including preparation, face-to-face counseling with the patient and coordination of care, physical exam, and documentation of the encounter. ____________________________________   Leeroy Due, PA-C   Lynwood CHARM Nasuti, PhD, MD   [facility]  Radiation Oncology Direct Dial: [phone]  Fax: [phone] [email or web address]    This document serves as a record of services personally performed by Lynwood Nasuti, MD. It was created on his behalf by Dorthy Fuse, a trained medical scribe. The creation of this record is based on the scribe's personal observations and the provider's statements to them. This document has been checked and approved by the attending provider.  "

## 2024-07-27 ENCOUNTER — Encounter: Payer: Self-pay | Admitting: Gynecologic Oncology

## 2024-07-27 ENCOUNTER — Inpatient Hospital Stay: Attending: Hematology and Oncology | Admitting: Gynecologic Oncology

## 2024-07-27 VITALS — BP 124/78 | HR 77 | Temp 98.5°F | Resp 19 | Wt 167.6 lb

## 2024-07-27 DIAGNOSIS — Z08 Encounter for follow-up examination after completed treatment for malignant neoplasm: Secondary | ICD-10-CM | POA: Insufficient documentation

## 2024-07-27 DIAGNOSIS — Z9221 Personal history of antineoplastic chemotherapy: Secondary | ICD-10-CM | POA: Insufficient documentation

## 2024-07-27 DIAGNOSIS — Z8541 Personal history of malignant neoplasm of cervix uteri: Secondary | ICD-10-CM | POA: Diagnosis not present

## 2024-07-27 DIAGNOSIS — Z923 Personal history of irradiation: Secondary | ICD-10-CM | POA: Insufficient documentation

## 2024-07-27 DIAGNOSIS — C539 Malignant neoplasm of cervix uteri, unspecified: Secondary | ICD-10-CM

## 2024-07-27 NOTE — Progress Notes (Signed)
 Gynecologic Oncology Return Clinic Visit  07/27/24  Reason for Visit: surveillance  Treatment History: Oncology History Overview Note  PD-L1 CPS 1%   Cervical cancer (HCC)  11/24/2022 Initial Diagnosis   The patient presented recently to the emergency department with both vaginal bleeding and rectal bleeding.  Exam in the emergency department showed a 6 cm irregular cervical mass.     11/24/2022 Imaging   CT pelvis 1. Heterogeneous cervical mass is highly worrisome for cervical carcinoma. Significant border of contact with the adjacent rectum. Difficult to exclude invasion. 2. Locule of air within the cervical mass may be due to necrosis or extension from the vagina. Difficult to exclude a fistula to the rectum.   11/24/2022 Imaging   US  pelvis 1. Large, approximately 7.6 cm vascular soft tissue mass of the lower uterine segment. This seems inseparable from the cervix and Cervical Carcinoma is not excluded. Alternatively this might be a large lower uterine fibroid. Recommend direct visualization of the cervix.   2. Elsewhere the uterus and endometrium are within normal limits. Normal ovaries and no free fluid.   11/26/2022 Pathology Results   SURGICAL PATHOLOGY  CASE: [id number]  PATIENT: Lorraine Yates  Surgical Pathology Report   FINAL MICROSCOPIC DIAGNOSIS:   A. 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  See comment.   COMMENT:   - Morphologic evaluation and p16 immunohistochemical stain reveal superficial squamous mucosa with findings consistent with high-grade squamous intraepithelial lesion.  The patient's clinical history of a cervical mass is noted, and a deeper more invasive process cannot be excluded.  Clinical and imaging correlation is recommended.      11/29/2022 Initial Diagnosis   Cervical cancer (HCC)   12/02/2022 Cancer Staging   Staging form: Cervix Uteri, AJCC Version 9 - Clinical stage from 12/02/2022: Stage IIIC1  (cT1b3, cN1, cM0) - Signed by Lonn Hicks, MD on 12/15/2022 Stage prefix: Initial diagnosis   12/15/2022 PET scan   1. Hypermetabolic cervical mass consistent with known cervical carcinoma. 2. There is a mildly FDG avid left pelvic sidewall lymph node which is equivocal for nodal metastasis. 3. No additional sites of disease identified. 4. Aortic Atherosclerosis (ICD10-I70.0).     12/16/2022 Procedure   Successful placement of a right internal jugular approach power injectable Port-A-Cath. The catheter is ready for immediate use.     12/17/2022 Pathology Results   SURGICAL PATHOLOGY  CASE: [id number]  PATIENT: Lorraine Yates  Surgical Pathology Report   Clinical History: malignant neoplasm of cervix, unspecified site   FINAL MICROSCOPIC DIAGNOSIS:   A. 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     12/18/2022 Imaging   1. The normal cervical anatomy is completely effaced by a bulky mass measuring 7.9 x 6.9 x 5.5 cm. Mass extends into the lower uterine segment with loss of parametrial definition about the posterior 180 degrees of the upper portion of the mass. Mass remains confined to the upper third of the vagina. 2. Preserved fat planes to the adjacent bladder and rectum. No ureteral invasion or hydronephrosis. 3. Small left pelvic sidewall lymph node measuring 0.9 x 0.6 cm, previously with equivocal FDG avidity and suspicious for a small nodal metastasis. No other enlarged lymph nodes in the pelvis.     01/20/2023 - 01/20/2023 Chemotherapy   Patient is on Treatment Plan : HEAD/NECK Cisplatin  (40) q7d  01/20/2023 - 03/08/2024 Chemotherapy   Patient is on Treatment Plan : Cervical Pembrolizumab  (200) q21d, cisplatin  (40) q7d + XRT / pembrolizumab  (400) q42d      01/20/2023 - 04/07/2023 Radiation Therapy   Radiation treatment dates: 01/20/23 through  04/07/23 ( Pelvic IMRT : 01/20/23 through 03/03/23)  ( Brachytherapy :5 treatment sessions delivered from 03/10/23 through 04/07/23)   Site/dose:   1) Pelvic IMRT - 45 Gy delivered in 25 Fx at 1.8 Gy/Fx  2) Pelvic boost - 9 Gy delivered in 5 Fx at 1.8 Gy/Fx 3) Cervical brachytherapy - 27.5 Gy delivered in 5 Fx at 5/5 Gy/Fx    Technique/Mode:  1) IMRT / Photon  2) 3D / Photon  3) HDR Ir-192 / Brachytherapy    04/23/2023 Imaging   1. Nonshadowing echogenic focus along the fundus of the gallbladder may represent a nonshadowing stone or polyp. No evidence for acute cholecystitis. 2. Common bile duct is upper limits of normal at 6.0 mm.   09/26/2023 Imaging   1. Interval removal of the subhepatic drain and robotic assisted laparoscopic appendectomy. Small amount of ascites along the inferior margin of the liver and in the right lower quadrant. No focal fluid collection, omental nodularity or pneumoperitoneum identified. 2. No evidence of metastatic disease. 3. Stable nonspecific endometrial enhancement, possibly treatment related. 4. Mild perirectal soft tissue stranding, nonspecific and possibly treatment related as well.   04/09/2024 Imaging   CT ABDOMEN PELVIS W CONTRAST Result Date: 04/11/2024 CLINICAL DATA:  Follow-up cervical carcinoma. Previous chemo radiation. * Tracking Code: BO * EXAM: CT ABDOMEN AND PELVIS WITH CONTRAST TECHNIQUE: Multidetector CT imaging of the abdomen and pelvis was performed using the standard protocol following bolus administration of intravenous contrast. RADIATION DOSE REDUCTION: This exam was performed according to the departmental dose-optimization program which includes automated exposure control, adjustment of the mA and/or kV according to patient size and/or use of iterative reconstruction technique. CONTRAST:  OMNIPAQUE  IOHEXOL  300 MG/ML  SOLN COMPARISON:  09/26/2023 FINDINGS: Lower Chest: No acute findings. Hepatobiliary: No suspicious hepatic masses  identified. Gallbladder is unremarkable. No evidence of biliary ductal dilatation. Pancreas:  No mass or inflammatory changes. Spleen: Within normal limits in size and appearance. Adrenals/Urinary Tract: No suspicious masses identified. No evidence of ureteral calculi or hydronephrosis. Stomach/Bowel: No evidence of obstruction, inflammatory process or abnormal fluid collections. Diffuse colonic diverticulosis again seen, without signs of diverticulitis. Vascular/Lymphatic: No pathologically enlarged lymph nodes. No acute vascular findings. Reproductive: Uterus in cervix remain unremarkable in appearance. No mass identified. Adnexal regions are unremarkable. Perirectal and presacral soft tissue stranding are stable and consistent with post radiation changes. Other:  None. Musculoskeletal:  No suspicious bone lesions identified. IMPRESSION: Stable exam. No evidence of recurrent or metastatic carcinoma within the abdomen or pelvis. Colonic diverticulosis, without radiographic evidence of diverticulitis. Electronically Signed   By: Norleen DELENA Kil M.D.   On: 04/11/2024 10:26        Interval History: Overall doing well.  Denies any vaginal bleeding or discharge.  Denies any pelvic pain.  Has some hot flashes.  Using vaginal dilator or having intercourse approximately 2-3 times a month.  Endorses baseline bowel and bladder function.  Past Medical/Surgical History: Past Medical History:  Diagnosis Date   Acquired pancytopenia (HCC)    Anemia    Blood transfusion without reported diagnosis    History of cancer chemotherapy    cervical cancer  01-20-2023  to 02-28-2023   History of radiation therapy    Cervix- 01/20/23-04/07/23-Dr. 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   oncologist--- dr gorsuch/  radiation oncologist--- dr shannon;  dx 05/ 2024;   chemo 01-20-2023 to 02-28-2023;   IMRT 01-20-2023 to 03-03-2023  to start high dose radiation 03-10-2023 w/ tandem    Port-A-Cath in place 12/16/2022    Past Surgical History:  Procedure Laterality Date   APPENDECTOMY  08/2023   IR IMAGING GUIDED PORT INSERTION  12/16/2022   IR RADIOLOGIST EVAL & MGMT  05/18/2023   OPERATIVE ULTRASOUND N/A 03/10/2023   Procedure: OPERATIVE ULTRASOUND;  Surgeon: Shannon Agent, MD;  [facility];  Service: Urology;  Laterality: N/A;   OPERATIVE ULTRASOUND N/A 03/15/2023   Procedure: OPERATIVE ULTRASOUND;  Surgeon: Shannon Agent, MD;  Location: [facility];  Service: Urology;  Laterality: N/A;   OPERATIVE ULTRASOUND N/A 03/21/2023   Procedure: OPERATIVE ULTRASOUND;  Surgeon: Shannon Agent, MD;  Location: [facility];  Service: Urology;  Laterality: N/A;   OPERATIVE ULTRASOUND N/A 03/31/2023   Procedure: OPERATIVE ULTRASOUND;  Surgeon: Shannon Agent, MD;  Location: [facility];  Service: Urology;  Laterality: N/A;   OPERATIVE ULTRASOUND N/A 04/07/2023   Procedure: OPERATIVE ULTRASOUND;  Surgeon: Shannon Agent, MD;  Location: [facility];  Service: Urology;  Laterality: N/A;   TANDEM RING INSERTION N/A 03/10/2023   Procedure: TANDEM RING INSERTION;  Surgeon: Shannon Agent, MD;  Location: [facility];  Service: Urology;  Laterality: N/A;   TANDEM RING INSERTION N/A 03/15/2023   Procedure: TANDEM RING INSERTION;  Surgeon: Shannon Agent, MD;  Location: [facility];  Service: Urology;  Laterality: N/A;   TANDEM RING INSERTION N/A 03/21/2023   Procedure: TANDEM RING INSERTION;  Surgeon: Shannon Agent, MD;  Location: [facility];  Service: Urology;  Laterality: N/A;   TANDEM RING INSERTION N/A 03/31/2023   Procedure: TANDEM RING INSERTION;  Surgeon: Shannon Agent, MD;  Location: [facility];  Service: Urology;  Laterality: N/A;   TANDEM RING INSERTION N/A 04/07/2023   Procedure: TANDEM RING INSERTION;  Surgeon: Shannon Agent, MD;  Location:  [facility];  Service: Urology;  Laterality: N/A;   XI ROBOTIC LAPAROSCOPIC ASSISTED APPENDECTOMY N/A 08/16/2023   Procedure: XI ROBOTIC LAPAROSCOPIC ASSISTED APPENDECTOMY;  Surgeon: Desiderio Schanz, MD;  Location: [facility];  Service: General;  Laterality: N/A;    Family History  Problem Relation Age of Onset   Alzheimer's disease Mother    Alzheimer's disease Father    Prostate cancer Father    Hypertension Father    Sickle cell trait Sister    Pancreatic cancer Neg Hx    Colon cancer Neg Hx    Breast cancer Neg Hx    Endometrial cancer Neg Hx    Stomach cancer Neg Hx    Rectal cancer Neg Hx    Esophageal cancer Neg Hx     Social History   Socioeconomic History   Marital status: Married    Spouse name: Not on file   Number of children: 4   Years of education: Not on file   Highest education level: Not on file  Occupational History   Occupation: personal shopper  Tobacco Use   Smoking status: Some Days    Current packs/day: 0.15    Types: Cigarettes   Smokeless tobacco: Never  Vaping Use   Vaping status: Never Used  Substance and Sexual Activity   Alcohol use: Not Currently  Comment: socially   Drug use: Yes    Types: Marijuana    Comment: 03-03-2023  per pt average since cancer dx once monthly (previously THC - once q 2 weeks)   Sexual activity: Yes    Birth control/protection: None  Other Topics Concern   Not on file  Social History Narrative   Not on file   Social Drivers of Health   Tobacco Use: High Risk (07/27/2024)   Patient History    Smoking Tobacco Use: Some Days    Smokeless Tobacco Use: Never    Passive Exposure: Not on file  Financial Resource Strain: Not on file  Food Insecurity: No Food Insecurity (04/26/2023)   Hunger Vital Sign    Worried About Running Out of Food in the Last Year: Never true    Ran Out of Food in the Last Year: Never true  Transportation Needs: No Transportation Needs (04/26/2023)   PRAPARE -  Administrator, Civil Service (Medical): No    Lack of Transportation (Non-Medical): No  Physical Activity: Not on file  Stress: Not on file  Social Connections: Moderately Isolated (11/25/2022)   Social Connection and Isolation Panel    Frequency of Communication with Friends and Family: More than three times a week    Frequency of Social Gatherings with Friends and Family: More than three times a week    Attends Religious Services: Never    Database Administrator or Organizations: No    Attends Banker Meetings: Never    Marital Status: Married  Depression (PHQ2-9): Low Risk (03/08/2024)   Depression (PHQ2-9)    PHQ-2 Score: 0  Alcohol Screen: Not on file  Housing: Low Risk (04/26/2023)   Housing    Last Housing Risk Score: 0  Utilities: Not At Risk (04/26/2023)   [facility] Utilities    Threatened with loss of utilities: No  Health Literacy: Not on file    Current Medications: Current Medications[1]  Review of Systems: + headache, hot flashes, ringing in ears Denies appetite changes, fevers, chills, fatigue, unexplained weight changes. Denies hearing loss, neck lumps or masses, mouth sores or voice changes. Denies cough or wheezing.  Denies shortness of breath. Denies chest pain or palpitations. Denies leg swelling. Denies abdominal distention, pain, blood in stools, constipation, diarrhea, nausea, vomiting, or early satiety. Denies pain with intercourse, dysuria, frequency, hematuria or incontinence. Denies pelvic pain, vaginal bleeding or vaginal discharge.   Denies joint pain, back pain or muscle pain/cramps. Denies itching, rash, or wounds. Denies dizziness, numbness or seizures. Denies swollen lymph nodes or glands, denies easy bruising or bleeding. Denies anxiety, depression, confusion, or decreased concentration.  Physical Exam: BP 124/78 (BP Location: Left Arm, Patient Position: Sitting)   Pulse 77   Temp 98.5 F (36.9 C) (Oral)   Resp 19    Wt 167 lb 9.6 oz (76 kg)   LMP 06/11/2022   SpO2 99%   BMI 29.69 kg/m  General: Alert, oriented, no acute distress. HEENT: Normocephalic, atraumatic, sclera anicteric. Chest: Clear to auscultation bilaterally.  No wheezes or rhonchi. Cardiovascular: Regular rate and rhythm, no murmurs. Abdomen: soft, nontender.  Normoactive bowel sounds.  No masses or hepatosplenomegaly appreciated.   Extremities: Grossly normal range of motion.  Warm, well perfused.  No edema bilaterally. Skin: No rashes or lesions noted. Lymphatics: No cervical, supraclavicular, or inguinal adenopathy. GU: Normal appearing external genitalia without erythema, excoriation, or lesions.  Speculum exam reveals no discharge or blood, vaginal mucosa is normal in  appearance.  Mild radiation changes noted at the apex with cervix somewhat posterior facing.  No lesions or masses, no atypical vascularity.  Bimanual exam reveals cervix is soft, no firmness or nodularity appreciated.  Rectovaginal exam deferred given recent banding procedure.  Laboratory & Radiologic Studies: 03/2024: NILM pap, HR HPV negative  Assessment & Plan: MAECIE SEVCIK is a 54 y.o. woman with a history of Stage IIIC1 cervical cancer. She received definitive radiation with sensitizing Cisplatin  and pembrolizumab  (per Keynote A18 regimen), now on maintenance IO.  Diagnosed with acute perforated appendicitis shortly after finishing HDR brachytherapy (04/07/23). This was managed with IV antibiotics and drain placement.  Drain removed 05/18/23. She underwent follow-up colonoscopy on 06/21/23 showing many diverticula in the entire colon, normal terminal ileum, internal and external hemorrhoids. Most recent CT imaging in 03/2024 showed no evidence of metastatic disease.   Completed maintenance IO in 03/2024.   Patient is doing well.  NED on exam today.  She will get repeat imaging in May.  Interested in potentially getting her port out after her next CT scan.   Discussed  importance of continued intercourse versus vaginal dilator use.   Due for cotesting in 03/2025.   Discussed hot flashes previously.  She is not especially bothered by the symptoms.  Her preference is not to take medication to treat the symptoms.  20 minutes of total time was spent for this patient encounter, including preparation, face-to-face counseling with the patient and coordination of care, and documentation of the encounter.  Comer Dollar, MD  Division of Gynecologic Oncology  Department of Obstetrics and Gynecology  [facility]      [1]  Current Outpatient Medications:    acetaminophen  (TYLENOL ) 500 MG tablet, Take 2 tablets (1,000 mg total) by mouth every 6 (six) hours as needed for mild pain (pain score 1-3)., Disp: , Rfl:    buPROPion  (WELLBUTRIN  SR) 150 MG 12 hr tablet, Take 1 tablet (150 mg total) by mouth 2 (two) times daily., Disp: 60 tablet, Rfl: 3   cyanocobalamin  (VITAMIN B12) 1000 MCG tablet, Take 1,000 mcg by mouth daily., Disp: , Rfl:    estradiol  (ESTRACE  VAGINAL) 0.1 MG/GM vaginal cream, Place 1 Applicatorful vaginally at bedtime., Disp: 42.5 g, Rfl: 12   ferrous sulfate 325 (65 FE) MG EC tablet, Take 325 mg by mouth daily with breakfast., Disp: , Rfl:    Fiber Gummies 2 g CHEW, Chew 1 each by mouth daily., Disp: , Rfl:

## 2024-07-27 NOTE — Patient Instructions (Signed)
 It was good to see you today.  I do not see or feel any evidence of cancer recurrence on your exam.  I will see you for follow-up in 6 months.  Please increase vaginal dilator use to 2-3 times a week.  As always, if you develop any new and concerning symptoms before your next visit, please call to see me sooner.

## 2024-07-30 ENCOUNTER — Ambulatory Visit: Admitting: Radiation Oncology

## 2024-09-21 ENCOUNTER — Inpatient Hospital Stay: Attending: Hematology and Oncology

## 2024-10-29 ENCOUNTER — Ambulatory Visit: Admitting: Radiation Oncology

## 2024-11-12 ENCOUNTER — Other Ambulatory Visit (HOSPITAL_COMMUNITY)

## 2024-11-16 ENCOUNTER — Inpatient Hospital Stay: Attending: Hematology and Oncology

## 2024-11-16 ENCOUNTER — Inpatient Hospital Stay: Admitting: Hematology and Oncology

## 2025-02-01 ENCOUNTER — Inpatient Hospital Stay: Admitting: Gynecologic Oncology
